# Patient Record
Sex: Female | Born: 1950 | Race: White | Hispanic: No | Marital: Married | Smoking: Never smoker
Health system: Southern US, Community
[De-identification: ages and names within clinical notes are randomized; demographics above are authoritative.]

## PROBLEM LIST (undated history)

## (undated) DIAGNOSIS — L570 Actinic keratosis: Secondary | ICD-10-CM

## (undated) DIAGNOSIS — Z972 Presence of dental prosthetic device (complete) (partial): Secondary | ICD-10-CM

## (undated) DIAGNOSIS — H269 Unspecified cataract: Secondary | ICD-10-CM

## (undated) DIAGNOSIS — I10 Essential (primary) hypertension: Secondary | ICD-10-CM

## (undated) DIAGNOSIS — E785 Hyperlipidemia, unspecified: Secondary | ICD-10-CM

## (undated) DIAGNOSIS — B261 Mumps meningitis: Secondary | ICD-10-CM

## (undated) DIAGNOSIS — T753XXA Motion sickness, initial encounter: Secondary | ICD-10-CM

## (undated) DIAGNOSIS — R7303 Prediabetes: Secondary | ICD-10-CM

## (undated) DIAGNOSIS — Z85828 Personal history of other malignant neoplasm of skin: Secondary | ICD-10-CM

## (undated) DIAGNOSIS — M1990 Unspecified osteoarthritis, unspecified site: Secondary | ICD-10-CM

## (undated) DIAGNOSIS — C801 Malignant (primary) neoplasm, unspecified: Secondary | ICD-10-CM

## (undated) HISTORY — DX: Malignant (primary) neoplasm, unspecified: C80.1

## (undated) HISTORY — PX: APPENDECTOMY: SHX54

## (undated) HISTORY — DX: Personal history of other malignant neoplasm of skin: Z85.828

## (undated) HISTORY — DX: Hyperlipidemia, unspecified: E78.5

## (undated) HISTORY — PX: ORTHOPEDIC SURGERY: SHX850

## (undated) HISTORY — DX: Actinic keratosis: L57.0

## (undated) HISTORY — PX: KNEE SURGERY: SHX244

## (undated) HISTORY — DX: Unspecified cataract: H26.9

## (undated) HISTORY — DX: Mumps meningitis: B26.1

---

## 2000-07-29 ENCOUNTER — Other Ambulatory Visit: Admission: RE | Admit: 2000-07-29 | Discharge: 2000-07-29 | Payer: Self-pay | Admitting: Internal Medicine

## 2003-01-21 ENCOUNTER — Encounter: Admission: RE | Admit: 2003-01-21 | Discharge: 2003-01-21 | Payer: Self-pay | Admitting: Internal Medicine

## 2003-01-21 ENCOUNTER — Encounter: Payer: Self-pay | Admitting: Internal Medicine

## 2003-09-06 ENCOUNTER — Other Ambulatory Visit: Admission: RE | Admit: 2003-09-06 | Discharge: 2003-09-06 | Payer: Self-pay | Admitting: Family Medicine

## 2003-10-07 ENCOUNTER — Encounter: Admission: RE | Admit: 2003-10-07 | Discharge: 2003-10-07 | Payer: Self-pay | Admitting: Family Medicine

## 2005-07-09 ENCOUNTER — Ambulatory Visit: Payer: Self-pay | Admitting: Family Medicine

## 2005-09-06 ENCOUNTER — Other Ambulatory Visit: Admission: RE | Admit: 2005-09-06 | Discharge: 2005-09-06 | Payer: Self-pay | Admitting: Family Medicine

## 2005-09-06 ENCOUNTER — Ambulatory Visit: Payer: Self-pay | Admitting: Family Medicine

## 2005-09-06 ENCOUNTER — Encounter: Admission: RE | Admit: 2005-09-06 | Discharge: 2005-09-06 | Payer: Self-pay | Admitting: Family Medicine

## 2005-10-04 ENCOUNTER — Encounter: Admission: RE | Admit: 2005-10-04 | Discharge: 2005-10-04 | Payer: Self-pay | Admitting: Family Medicine

## 2006-01-22 ENCOUNTER — Encounter: Admission: RE | Admit: 2006-01-22 | Discharge: 2006-01-22 | Payer: Self-pay | Admitting: Family Medicine

## 2006-11-24 ENCOUNTER — Ambulatory Visit: Payer: Self-pay | Admitting: Family Medicine

## 2007-12-08 ENCOUNTER — Telehealth: Payer: Self-pay | Admitting: Family Medicine

## 2011-07-15 ENCOUNTER — Emergency Department (HOSPITAL_COMMUNITY)
Admission: EM | Admit: 2011-07-15 | Discharge: 2011-07-15 | Disposition: A | Discharge status: Home / Self Care | Payer: Self-pay | Attending: Emergency Medicine | Admitting: Emergency Medicine

## 2011-07-15 DIAGNOSIS — I10 Essential (primary) hypertension: Secondary | ICD-10-CM | POA: Insufficient documentation

## 2011-07-15 DIAGNOSIS — S01501A Unspecified open wound of lip, initial encounter: Secondary | ICD-10-CM | POA: Insufficient documentation

## 2011-07-15 DIAGNOSIS — R221 Localized swelling, mass and lump, neck: Secondary | ICD-10-CM | POA: Insufficient documentation

## 2011-07-15 DIAGNOSIS — R220 Localized swelling, mass and lump, head: Secondary | ICD-10-CM | POA: Insufficient documentation

## 2011-07-15 DIAGNOSIS — Y990 Civilian activity done for income or pay: Secondary | ICD-10-CM | POA: Insufficient documentation

## 2011-07-15 DIAGNOSIS — W540XXA Bitten by dog, initial encounter: Secondary | ICD-10-CM | POA: Insufficient documentation

## 2011-07-15 DIAGNOSIS — S025XXA Fracture of tooth (traumatic), initial encounter for closed fracture: Secondary | ICD-10-CM | POA: Insufficient documentation

## 2011-10-21 ENCOUNTER — Encounter: Payer: Self-pay | Admitting: Emergency Medicine

## 2011-10-21 ENCOUNTER — Emergency Department (HOSPITAL_COMMUNITY)
Admission: EM | Admit: 2011-10-21 | Discharge: 2011-10-22 | Disposition: A | Discharge status: Home / Self Care | Payer: Self-pay | Attending: Emergency Medicine | Admitting: Emergency Medicine

## 2011-10-21 ENCOUNTER — Other Ambulatory Visit: Payer: Self-pay

## 2011-10-21 ENCOUNTER — Emergency Department (HOSPITAL_COMMUNITY): Payer: Self-pay

## 2011-10-21 DIAGNOSIS — Z9889 Other specified postprocedural states: Secondary | ICD-10-CM | POA: Insufficient documentation

## 2011-10-21 DIAGNOSIS — I10 Essential (primary) hypertension: Secondary | ICD-10-CM | POA: Insufficient documentation

## 2011-10-21 DIAGNOSIS — R000 Tachycardia, unspecified: Secondary | ICD-10-CM | POA: Insufficient documentation

## 2011-10-21 DIAGNOSIS — R002 Palpitations: Secondary | ICD-10-CM

## 2011-10-21 HISTORY — DX: Essential (primary) hypertension: I10

## 2011-10-21 NOTE — ED Notes (Signed)
PT. REPORTS PALPITATIONS " MY HEART IS POUNDING"  ONSET THIS EVENING ,  DENIES PAIN ,  SLIGHT SOB ,  NO COUGH , NO NAUSEA OR VOMITTING .

## 2011-10-22 LAB — CBC
HCT: 41 % (ref 36.0–46.0)
Hemoglobin: 13.9 g/dL (ref 12.0–15.0)
MCH: 31.7 pg (ref 26.0–34.0)
MCHC: 33.9 g/dL (ref 30.0–36.0)
MCV: 93.6 fL (ref 78.0–100.0)
Platelets: 235 10*3/uL (ref 150–400)
RBC: 4.38 MIL/uL (ref 3.87–5.11)
RDW: 13.2 % (ref 11.5–15.5)
WBC: 7.2 10*3/uL (ref 4.0–10.5)

## 2011-10-22 LAB — BASIC METABOLIC PANEL
BUN: 11 mg/dL (ref 6–23)
CO2: 31 mEq/L (ref 19–32)
Calcium: 9.6 mg/dL (ref 8.4–10.5)
Chloride: 104 mEq/L (ref 96–112)
Creatinine, Ser: 0.73 mg/dL (ref 0.50–1.10)
GFR calc Af Amer: >90 mL/min (ref >90)
GFR calc non Af Amer: >90 mL/min (ref >90)
Glucose, Bld: 135 mg/dL — ABNORMAL HIGH (ref 70–99)
Potassium: 3.4 mEq/L — ABNORMAL LOW (ref 3.5–5.1)
Sodium: 145 mEq/L (ref 135–145)

## 2011-10-22 LAB — DIFFERENTIAL
Basophils Absolute: 0 10*3/uL (ref 0.0–0.1)
Basophils Relative: 0 % (ref 0–1)
Eosinophils Absolute: 0.1 10*3/uL (ref 0.0–0.7)
Eosinophils Relative: 1 % (ref 0–5)
Lymphocytes Relative: 17 % (ref 12–46)
Lymphs Abs: 1.2 10*3/uL (ref 0.7–4.0)
Monocytes Absolute: 0.5 10*3/uL (ref 0.1–1.0)
Monocytes Relative: 7 % (ref 3–12)
Neutro Abs: 5.4 10*3/uL (ref 1.7–7.7)
Neutrophils Relative %: 75 % (ref 43–77)

## 2011-10-22 LAB — POCT I-STAT TROPONIN I: Troponin i, poc: 0 ng/mL (ref 0.00–0.08)

## 2011-10-22 MED ORDER — METOPROLOL SUCCINATE ER 25 MG PO TB24: 100.0000 mg | ORAL_TABLET | Freq: Every day | ORAL | Status: DC

## 2011-10-22 MED ORDER — TRIAMTERENE-HCTZ 37.5-25 MG PO TABS: 1.0000 | ORAL_TABLET | Freq: Every day | ORAL | Status: DC

## 2011-10-22 NOTE — ED Provider Notes (Addendum)
History     CSN: [id number] Arrival date & time: 10/21/2011 11:15 PM   First MD Initiated Contact with Patient 10/22/11 0200      Chief Complaint  Patient presents with  . Palpitations    (Consider location/radiation/quality/duration/timing/severity/associated sxs/prior treatment) Patient is a 60 y.o. female presenting with palpitations. The history is provided by the patient.  Palpitations  This is a recurrent problem. The current episode started 1 to 2 hours ago. The problem occurs constantly. The problem has been resolved. Associated with: nothing. Pertinent negatives include no diaphoresis, no fever and no chest pain. She has tried nothing for the symptoms. Risk factors include no known risk factors.    Past Medical History  Diagnosis Date  . Hypertension     Past Surgical History  Procedure Date  . Cesarean section   . Appendectomy     No family history on file.  History  Substance Use Topics  . Smoking status: Never Smoker   . Smokeless tobacco: Not on file  . Alcohol Use: No    OB History    Grav Para Term Preterm Abortions TAB SAB Ect Mult Living                  Review of Systems  Constitutional: Negative for fever and diaphoresis.  Cardiovascular: Positive for palpitations. Negative for chest pain.  All other systems reviewed and are negative.    Allergies  Morphine and related  Home Medications  No current outpatient prescriptions on file.  BP 163/88  Pulse 64  Temp(Src) 98.2 F (36.8 C) (Oral)  Resp 20  SpO2 98%  Physical Exam  Nursing note and vitals reviewed. Constitutional: She is oriented to person, place, and time. She appears well-developed and well-nourished. No distress.  HENT:  Head: Normocephalic and atraumatic.  Neck: Normal range of motion. Neck supple.  Cardiovascular: Normal rate and regular rhythm.  Exam reveals no gallop and no friction rub.   No murmur heard. Pulmonary/Chest: Effort normal and breath sounds normal.  No respiratory distress. She has no wheezes.  Abdominal: Soft. Bowel sounds are normal. She exhibits no distension. There is no tenderness.  Musculoskeletal: Normal range of motion.  Neurological: She is alert and oriented to person, place, and time.  Skin: Skin is warm and dry. She is not diaphoretic.    ED Course  Procedures (including critical care time)  Labs Reviewed  BASIC METABOLIC PANEL - Abnormal; Notable for the following:    Potassium 3.4 (*)    Glucose, Bld 135 (*)    All other components within normal limits  CBC  DIFFERENTIAL  POCT I-STAT TROPONIN I  I-STAT TROPONIN I   Dg Chest 2 View  10/22/2011  *RADIOLOGY REPORT*  Clinical Data: Palpitations and tachycardia.  CHEST - 2 VIEW  Comparison: None.  Findings: The heart size and mediastinal contours are normal. The lungs are clear. There is no pleural effusion or pneumothorax. No acute osseous findings are identified.  Small calcification adjacent to the proximal right humerus may reflect hydroxyapatite deposition.  IMPRESSION: No acute cardiopulmonary process.  Possible right shoulder calcific tendonitis or bursitis.  Original Report Authenticated By: Gerrianne Scale, M.D.     No diagnosis found.   Date: 10/22/2011  Rate: 67  Rhythm: normal sinus rhythm  QRS Axis: normal  Intervals: normal  ST/T Wave abnormalities: normal  Conduction Disutrbances:none  Narrative Interpretation:   Old EKG Reviewed: none available    MDM  Patient's symptoms resolved prior to arrival.  Her ekg and lab work looks fine.  She had no pain and I will discharge her with the recommendations that she see her pcp to discuss event monitoring if symptoms recur.          Geoffery Lyons, MD 10/22/11 1191  Geoffery Lyons, MD 01/30/12 1501

## 2012-07-05 ENCOUNTER — Emergency Department (HOSPITAL_COMMUNITY): Payer: Self-pay

## 2012-07-05 ENCOUNTER — Encounter (HOSPITAL_COMMUNITY): Payer: Self-pay | Admitting: Emergency Medicine

## 2012-07-05 ENCOUNTER — Emergency Department (HOSPITAL_COMMUNITY)
Admission: EM | Admit: 2012-07-05 | Discharge: 2012-07-05 | Disposition: A | Discharge status: Home / Self Care | Payer: Self-pay | Attending: Emergency Medicine | Admitting: Emergency Medicine

## 2012-07-05 DIAGNOSIS — K8050 Calculus of bile duct without cholangitis or cholecystitis without obstruction: Secondary | ICD-10-CM

## 2012-07-05 DIAGNOSIS — I10 Essential (primary) hypertension: Secondary | ICD-10-CM | POA: Insufficient documentation

## 2012-07-05 DIAGNOSIS — Z9089 Acquired absence of other organs: Secondary | ICD-10-CM | POA: Insufficient documentation

## 2012-07-05 DIAGNOSIS — K8020 Calculus of gallbladder without cholecystitis without obstruction: Secondary | ICD-10-CM | POA: Insufficient documentation

## 2012-07-05 DIAGNOSIS — Z7982 Long term (current) use of aspirin: Secondary | ICD-10-CM | POA: Insufficient documentation

## 2012-07-05 LAB — COMPREHENSIVE METABOLIC PANEL
ALT: 16 U/L (ref 0–35)
AST: 23 U/L (ref 0–37)
Albumin: 3.9 g/dL (ref 3.5–5.2)
Alkaline Phosphatase: 69 U/L (ref 39–117)
BUN: 23 mg/dL (ref 6–23)
CO2: 32 mEq/L (ref 19–32)
Calcium: 10.2 mg/dL (ref 8.4–10.5)
Chloride: 99 mEq/L (ref 96–112)
Creatinine, Ser: 1.07 mg/dL (ref 0.50–1.10)
GFR calc Af Amer: 64 mL/min — ABNORMAL LOW (ref >90)
GFR calc non Af Amer: 55 mL/min — ABNORMAL LOW (ref >90)
Glucose, Bld: 168 mg/dL — ABNORMAL HIGH (ref 70–99)
Potassium: 3 mEq/L — ABNORMAL LOW (ref 3.5–5.1)
Sodium: 141 mEq/L (ref 135–145)
Total Bilirubin: 0.3 mg/dL (ref 0.3–1.2)
Total Protein: 7.9 g/dL (ref 6.0–8.3)

## 2012-07-05 LAB — CBC WITH DIFFERENTIAL/PLATELET
Basophils Absolute: 0 10*3/uL (ref 0.0–0.1)
Basophils Relative: 0 % (ref 0–1)
Eosinophils Absolute: 0.1 10*3/uL (ref 0.0–0.7)
Eosinophils Relative: 1 % (ref 0–5)
HCT: 39.4 % (ref 36.0–46.0)
Hemoglobin: 13.1 g/dL (ref 12.0–15.0)
Lymphocytes Relative: 11 % — ABNORMAL LOW (ref 12–46)
Lymphs Abs: 1.1 10*3/uL (ref 0.7–4.0)
MCH: 31 pg (ref 26.0–34.0)
MCHC: 33.2 g/dL (ref 30.0–36.0)
MCV: 93.4 fL (ref 78.0–100.0)
Monocytes Absolute: 0.3 10*3/uL (ref 0.1–1.0)
Monocytes Relative: 3 % (ref 3–12)
Neutro Abs: 8.7 10*3/uL — ABNORMAL HIGH (ref 1.7–7.7)
Neutrophils Relative %: 85 % — ABNORMAL HIGH (ref 43–77)
Platelets: 220 10*3/uL (ref 150–400)
RBC: 4.22 MIL/uL (ref 3.87–5.11)
RDW: 13.6 % (ref 11.5–15.5)
WBC: 10.2 10*3/uL (ref 4.0–10.5)

## 2012-07-05 MED ORDER — PANTOPRAZOLE SODIUM 40 MG IV SOLR
40.0000 mg | Freq: Once | INTRAVENOUS | Status: AC
  Administered 2012-07-05: 40 mg via INTRAVENOUS
  Filled 2012-07-05: qty 40

## 2012-07-05 MED ORDER — SODIUM CHLORIDE 0.9 % IV SOLN
Freq: Once | INTRAVENOUS | Status: AC
  Administered 2012-07-05: 1000 mL via INTRAVENOUS

## 2012-07-05 MED ORDER — ONDANSETRON HCL 4 MG/2ML IJ SOLN
4.0000 mg | Freq: Once | INTRAMUSCULAR | Status: AC
  Administered 2012-07-05: 4 mg via INTRAVENOUS
  Filled 2012-07-05: qty 2

## 2012-07-05 MED ORDER — HYDROCODONE-ACETAMINOPHEN 5-325 MG PO TABS
1.0000 | ORAL_TABLET | Freq: Once | ORAL | Status: AC
  Administered 2012-07-05: 1 via ORAL
  Filled 2012-07-05: qty 1

## 2012-07-05 MED ORDER — FENTANYL CITRATE 0.05 MG/ML IJ SOLN
50.0000 ug | Freq: Once | INTRAMUSCULAR | Status: AC
  Administered 2012-07-05: 50 ug via INTRAVENOUS
  Filled 2012-07-05: qty 2

## 2012-07-05 MED ORDER — HYDROCODONE-ACETAMINOPHEN 5-500 MG PO TABS
1.0000 | ORAL_TABLET | Freq: Four times a day (QID) | ORAL | Status: AC | PRN
Start: 2012-07-05 — End: 2012-07-15

## 2012-07-05 MED ORDER — PROMETHAZINE HCL 25 MG PO TABS: 12.5000 mg | ORAL_TABLET | Freq: Four times a day (QID) | ORAL | Status: DC | PRN

## 2012-07-05 NOTE — ED Provider Notes (Signed)
History     CSN: [id number]  Arrival date & time 07/05/12  0138   First MD Initiated Contact with Patient 07/05/12 0154      Chief Complaint  Patient presents with  . Chest Pain    (Consider location/radiation/quality/duration/timing/severity/associated sxs/prior treatment) HPI Comments: Patient states she was awakened from sleep with epigastric pain, radiating to right upper, quadrant.  She's tried taking Pepcid times.  Abdomen, the relief.  She, states she's had this discomfort before.  Her last as long or radiated to her back, behind her right shoulder blade.  Mid back area.   She states she ate a salad grilled chicken for dinner  Patient is a 61 y.o. female presenting with chest pain. The history is provided by the patient.  Chest Pain The chest pain began 1 - 2 hours ago. Chest pain occurs constantly. The chest pain is unchanged. At its most intense, the pain is at 7/10. The pain is currently at 7/10. The severity of the pain is moderate. The quality of the pain is described as pressure-like. The pain radiates to the mid back. Primary symptoms include nausea. Pertinent negatives for primary symptoms include no fever, no fatigue, no shortness of breath and no vomiting. She tried nothing for the symptoms.     Past Medical History  Diagnosis Date  . Hypertension     Past Surgical History  Procedure Date  . Cesarean section   . Appendectomy     History reviewed. No pertinent family history.  History  Substance Use Topics  . Smoking status: Never Smoker   . Smokeless tobacco: Not on file  . Alcohol Use: No    OB History    Grav Para Term Preterm Abortions TAB SAB Ect Mult Living                  Review of Systems  Constitutional: Negative for fever and fatigue.  Respiratory: Negative for shortness of breath.   Cardiovascular: Positive for chest pain.  Gastrointestinal: Positive for nausea. Negative for vomiting.    Allergies  Morphine and related  Home  Medications   Current Outpatient Rx  Name Route Sig Dispense Refill  . ASPIRIN 325 MG PO TABS Oral Take 325 mg by mouth daily.      Marland Kitchen METOPROLOL SUCCINATE ER 100 MG PO TB24 Oral Take 100 mg by mouth daily.      . TRIAMTERENE-HCTZ 37.5-25 MG PO TABS Oral Take 1 tablet by mouth daily.      Marland Kitchen HYDROCODONE-ACETAMINOPHEN 5-500 MG PO TABS Oral Take 1-2 tablets by mouth every 6 (six) hours as needed for pain. 15 tablet 0  . PROMETHAZINE HCL 25 MG PO TABS Oral Take 0.5 tablets (12.5 mg total) by mouth every 6 (six) hours as needed for nausea. 30 tablet 0    BP 125/60  Pulse 57  Resp 18  SpO2 96%  Physical Exam  Constitutional: She appears well-developed and well-nourished.  HENT:  Head: Normocephalic.  Eyes: Pupils are equal, round, and reactive to light.  Neck: Normal range of motion.  Cardiovascular: Bradycardia present.        Patient beta blocker, which is the most likely explanation for her bradycardia  Pulmonary/Chest: Effort normal. She has no wheezes. She exhibits no tenderness.  Abdominal: Soft. Bowel sounds are normal. There is no hepatosplenomegaly. There is tenderness in the right upper quadrant and epigastric area.  Musculoskeletal: Normal range of motion.  Neurological: She is alert.  Skin: Skin is warm.  ED Course  Procedures (including critical care time)  Labs Reviewed  CBC WITH DIFFERENTIAL - Abnormal; Notable for the following:    Neutrophils Relative 85 (*)     Neutro Abs 8.7 (*)     Lymphocytes Relative 11 (*)     All other components within normal limits  COMPREHENSIVE METABOLIC PANEL - Abnormal; Notable for the following:    Potassium 3.0 (*)     Glucose, Bld 168 (*)     GFR calc non Af Amer 55 (*)     GFR calc Af Amer 64 (*)     All other components within normal limits   Dg Chest 2 View  07/05/2012  *RADIOLOGY REPORT*  Clinical Data: Mid sternal chest pain.  Nausea.  CHEST - 2 VIEW  Comparison: 10/21/2011  Findings: Borderline heart size with normal  pulmonary vascularity. Shallow inspiration.  No focal airspace consolidation or edema in the lungs.  No blunting of costophrenic angles.  No pneumothorax. Mediastinal contours appear intact.  Degenerative changes in the thoracic spine.  No significant change since previous study.  IMPRESSION: Borderline heart size.  No evidence of active pulmonary disease.  Original Report Authenticated By: Marlon Pel, M.D.   US Abdomen Complete  07/05/2012  *RADIOLOGY REPORT*  Clinical Data:  Epigastric pain radiating to the right upper quadrant.  COMPLETE ABDOMINAL ULTRASOUND  Comparison:  None.  Findings:  Gallbladder:  Multiple gallstones.  No gallbladder wall thickening or edema.  Murphy's sign is negative.  Common bile duct:  Normal caliber with measured diameter of 5.4 mm.  Liver:  Diffusely increased hepatic parenchymal echotexture suggesting fatty infiltration.  IVC:  Appears normal.  Pancreas:  No focal abnormality seen.  Spleen:  Spleen length measures 7.3 cm.  Normal parenchymal echotexture.  Right Kidney:  Right kidney measures 10.9 cm length.  No hydronephrosis.  Left Kidney:  Left kidney measures 10.3 cm length.  No hydronephrosis.  Abdominal aorta:  No aneurysm identified.  IMPRESSION: Cholelithiasis.  Increased hepatic echotexture suggesting fatty infiltration.  Original Report Authenticated By: Marlon Pel, M.D.     1. Biliary colic     ED ECG REPORT   Date: 07/05/2012  EKG Time: 5:39 AM  Rate: 53  Rhythm: sinus bradycardia,  unchanged from previous tracings  Axis: normal  Intervals:none  ST&T Change: questionable septal infarct age indertermind age  unchanged for previous of 12/12  Narrative Interpretation: abnoramal            MDM  I discussed at length.  Results ultrasound, as well as lab work, and EKG patient understand signs and symptoms to return to the emergency department for further treatment versus elective surgery or therapy through [facility] [place] [facility] or at  [facility]         Arman Filter, NP 07/05/12 [phone]

## 2012-07-05 NOTE — ED Notes (Signed)
Oxygen 2L applied due to sats dropping when she falls asleep

## 2012-07-05 NOTE — ED Notes (Addendum)
Patient complaining of mid-sternal chest pain that started three hours ago; reports taking Pepcid, Tums, and Advil; has provided little to no relief.  Pain radiates to back.  Denies shortness of breath, but reports nausea.

## 2012-07-05 NOTE — ED Notes (Signed)
Patient states she is having mid sternal chest pain that goes "plum through to my back".  States has had this before but would take Pepcid and it would go away but not this time.

## 2012-07-05 NOTE — ED Provider Notes (Signed)
Medical screening examination/treatment/procedure(s) were performed by non-physician practitioner and as supervising physician I was immediately available for consultation/collaboration.  Georges Victorio M Dason Mosley, MD 07/05/12 0629 

## 2012-09-19 ENCOUNTER — Encounter (HOSPITAL_COMMUNITY): Payer: Self-pay | Admitting: *Deleted

## 2012-09-19 ENCOUNTER — Inpatient Hospital Stay (HOSPITAL_COMMUNITY)
Admission: EM | Admit: 2012-09-19 | Discharge: 2012-09-23 | DRG: 572 | Disposition: A | Discharge status: Home Health | Payer: No Typology Code available for payment source | Attending: Internal Medicine | Admitting: Internal Medicine

## 2012-09-19 DIAGNOSIS — Z885 Allergy status to narcotic agent status: Secondary | ICD-10-CM

## 2012-09-19 DIAGNOSIS — L0390 Cellulitis, unspecified: Secondary | ICD-10-CM

## 2012-09-19 DIAGNOSIS — I10 Essential (primary) hypertension: Secondary | ICD-10-CM | POA: Diagnosis present

## 2012-09-19 DIAGNOSIS — L03119 Cellulitis of unspecified part of limb: Principal | ICD-10-CM | POA: Diagnosis present

## 2012-09-19 DIAGNOSIS — IMO0002 Reserved for concepts with insufficient information to code with codable children: Secondary | ICD-10-CM

## 2012-09-19 DIAGNOSIS — E876 Hypokalemia: Secondary | ICD-10-CM | POA: Diagnosis present

## 2012-09-19 DIAGNOSIS — L089 Local infection of the skin and subcutaneous tissue, unspecified: Secondary | ICD-10-CM

## 2012-09-19 DIAGNOSIS — T8130XA Disruption of wound, unspecified, initial encounter: Secondary | ICD-10-CM

## 2012-09-19 DIAGNOSIS — Y929 Unspecified place or not applicable: Secondary | ICD-10-CM

## 2012-09-19 DIAGNOSIS — L02419 Cutaneous abscess of limb, unspecified: Principal | ICD-10-CM | POA: Diagnosis present

## 2012-09-19 LAB — CBC WITH DIFFERENTIAL/PLATELET
Basophils Absolute: 0 10*3/uL (ref 0.0–0.1)
Eosinophils Absolute: 0.1 10*3/uL (ref 0.0–0.7)
Eosinophils Relative: 3 % (ref 0–5)
Lymphocytes Relative: 33 % (ref 12–46)
MCH: 30.1 pg (ref 26.0–34.0)
MCV: 93.1 fL (ref 78.0–100.0)
Platelets: 227 10*3/uL (ref 150–400)
RDW: 13.9 % (ref 11.5–15.5)
WBC: 4.5 10*3/uL (ref 4.0–10.5)

## 2012-09-19 LAB — COMPREHENSIVE METABOLIC PANEL
ALT: 268 U/L — ABNORMAL HIGH (ref 0–35)
AST: 227 U/L — ABNORMAL HIGH (ref 0–37)
Calcium: 9.4 mg/dL (ref 8.4–10.5)
Sodium: 141 mEq/L (ref 135–145)
Total Protein: 8 g/dL (ref 6.0–8.3)

## 2012-09-19 MED ORDER — SODIUM CHLORIDE 0.9 % IV SOLN
INTRAVENOUS | Status: AC
  Administered 2012-09-19: via INTRAVENOUS

## 2012-09-19 MED ORDER — PIPERACILLIN-TAZOBACTAM 3.375 G IVPB
3.3750 g | Freq: Once | INTRAVENOUS | Status: AC
  Administered 2012-09-19: 3.375 g via INTRAVENOUS
  Filled 2012-09-19 (×2): qty 50

## 2012-09-19 MED ORDER — VANCOMYCIN HCL IN DEXTROSE 1-5 GM/200ML-% IV SOLN
1000.0000 mg | Freq: Once | INTRAVENOUS | Status: AC
  Administered 2012-09-19: 1000 mg via INTRAVENOUS
  Filled 2012-09-19: qty 200

## 2012-09-19 MED ORDER — ONDANSETRON HCL 4 MG/2ML IJ SOLN: 4.0000 mg | Freq: Three times a day (TID) | INTRAMUSCULAR | Status: AC | PRN
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
## 2012-09-19 NOTE — ED Notes (Signed)
Left leg wound is minimally painful, has no redness or streaking and has minimal drainage, no bleeding noted
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
## 2012-09-20 ENCOUNTER — Observation Stay (HOSPITAL_COMMUNITY): Payer: No Typology Code available for payment source

## 2012-09-20 ENCOUNTER — Encounter (HOSPITAL_COMMUNITY): Payer: Self-pay | Admitting: *Deleted

## 2012-09-20 DIAGNOSIS — L0390 Cellulitis, unspecified: Secondary | ICD-10-CM

## 2012-09-20 DIAGNOSIS — T8131XA Disruption of external operation (surgical) wound, not elsewhere classified, initial encounter: Secondary | ICD-10-CM

## 2012-09-20 DIAGNOSIS — I10 Essential (primary) hypertension: Secondary | ICD-10-CM | POA: Diagnosis present

## 2012-09-20 LAB — COMPREHENSIVE METABOLIC PANEL
AST: 342 U/L — ABNORMAL HIGH (ref 0–37)
CO2: 30 mEq/L (ref 19–32)
Calcium: 9.2 mg/dL (ref 8.4–10.5)
Chloride: 105 mEq/L (ref 96–112)
Creatinine, Ser: 0.75 mg/dL (ref 0.50–1.10)
GFR calc Af Amer: >90 mL/min (ref >90)
GFR calc non Af Amer: 90 mL/min — ABNORMAL LOW (ref >90)
Glucose, Bld: 89 mg/dL (ref 70–99)
Total Bilirubin: 0.3 mg/dL (ref 0.3–1.2)

## 2012-09-20 LAB — APTT: aPTT: 31 seconds (ref 24–37)

## 2012-09-20 LAB — PROTIME-INR: INR: 1.07 (ref 0.00–1.49)

## 2012-09-20 LAB — CBC
HCT: 37.6 % (ref 36.0–46.0)
Hemoglobin: 12.1 g/dL (ref 12.0–15.0)
MCH: 29.9 pg (ref 26.0–34.0)
MCV: 92.8 fL (ref 78.0–100.0)
RBC: 4.05 MIL/uL (ref 3.87–5.11)
WBC: 4.4 10*3/uL (ref 4.0–10.5)

## 2012-09-20 LAB — SURGICAL PCR SCREEN: MRSA, PCR: NEGATIVE

## 2012-09-20 MED ORDER — PIPERACILLIN-TAZOBACTAM 3.375 G IVPB: 3.3750 g | Freq: Four times a day (QID) | INTRAVENOUS | Status: DC

## 2012-09-20 MED ORDER — HEPARIN SODIUM (PORCINE) 5000 UNIT/ML IJ SOLN
5000.0000 [IU] | Freq: Three times a day (TID) | INTRAMUSCULAR | Status: DC
  Administered 2012-09-20 (×2): 5000 [IU] via SUBCUTANEOUS
  Filled 2012-09-20 (×4): qty 1

## 2012-09-20 MED ORDER — SODIUM CHLORIDE 0.9 % IJ SOLN: 3.0000 mL | INTRAMUSCULAR | Status: DC | PRN

## 2012-09-20 MED ORDER — VANCOMYCIN HCL 1000 MG IV SOLR
750.0000 mg | Freq: Two times a day (BID) | INTRAVENOUS | Status: DC
  Administered 2012-09-20 – 2012-09-23 (×7): 750 mg via INTRAVENOUS
  Filled 2012-09-20 (×8): qty 750

## 2012-09-20 MED ORDER — HEPARIN SODIUM (PORCINE) 5000 UNIT/ML IJ SOLN
5000.0000 [IU] | Freq: Three times a day (TID) | INTRAMUSCULAR | Status: AC
  Administered 2012-09-20: 5000 [IU] via SUBCUTANEOUS
  Filled 2012-09-20: qty 1

## 2012-09-20 MED ORDER — SODIUM CHLORIDE 0.9 % IJ SOLN
3.0000 mL | Freq: Two times a day (BID) | INTRAMUSCULAR | Status: DC
  Administered 2012-09-20: 3 mL via INTRAVENOUS

## 2012-09-20 MED ORDER — TRIAMTERENE-HCTZ 37.5-25 MG PO TABS
1.0000 | ORAL_TABLET | Freq: Every day | ORAL | Status: DC
  Administered 2012-09-20 – 2012-09-22 (×3): 1 via ORAL
  Filled 2012-09-20 (×4): qty 1

## 2012-09-20 MED ORDER — SODIUM CHLORIDE 0.9 % IV SOLN: 250.0000 mL | INTRAVENOUS | Status: DC | PRN

## 2012-09-20 MED ORDER — HYDROCODONE-ACETAMINOPHEN 5-325 MG PO TABS
1.0000 | ORAL_TABLET | Freq: Four times a day (QID) | ORAL | Status: DC | PRN
  Administered 2012-09-20 – 2012-09-22 (×4): 1 via ORAL
  Filled 2012-09-20 (×3): qty 1

## 2012-09-20 MED ORDER — INFLUENZA VIRUS VACC SPLIT PF IM SUSP
0.5000 mL | INTRAMUSCULAR | Status: AC
  Administered 2012-09-20: 0.5 mL via INTRAMUSCULAR
  Filled 2012-09-20: qty 0.5

## 2012-09-20 MED ORDER — ATENOLOL 50 MG PO TABS
50.0000 mg | ORAL_TABLET | Freq: Every day | ORAL | Status: DC
  Administered 2012-09-20 – 2012-09-23 (×3): 50 mg via ORAL
  Filled 2012-09-20 (×4): qty 1

## 2012-09-20 MED ORDER — HEPARIN SODIUM (PORCINE) 5000 UNIT/ML IJ SOLN
5000.0000 [IU] | Freq: Three times a day (TID) | INTRAMUSCULAR | Status: DC
  Administered 2012-09-21 – 2012-09-23 (×6): 5000 [IU] via SUBCUTANEOUS
  Filled 2012-09-20 (×8): qty 1

## 2012-09-20 MED ORDER — PIPERACILLIN-TAZOBACTAM 3.375 G IVPB
3.3750 g | Freq: Three times a day (TID) | INTRAVENOUS | Status: DC
  Administered 2012-09-20 – 2012-09-23 (×10): 3.375 g via INTRAVENOUS
  Filled 2012-09-20 (×13): qty 50
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
## 2012-09-20 NOTE — Evaluation (Signed)
Physical Therapy Evaluation Patient Details Name: Jennifer [name] MRN: [id number] DOB: 25-Apr-1951 Today's Date: 09/20/2012 Time: 4098-1191 PT Time Calculation (min): 23 min  PT Assessment / Plan / Recommendation Clinical Impression  Pt admitted for I&D on L thigh(planned for tomorrow). Pt still on camboot from surgery last month. Pt is still ambulating with RW although no cues needed for safety. No further PT needs, will not follow.     PT Assessment  Patent does not need any further PT services    Follow Up Recommendations  No PT follow up    Does the patient have the potential to tolerate intense rehabilitation      Barriers to Discharge        Equipment Recommendations  None recommended by PT    Recommendations for Other Services     Frequency      Precautions / Restrictions Precautions Precautions: None Restrictions Weight Bearing Restrictions: Yes RLE Weight Bearing: Weight bearing as tolerated Other Position/Activity Restrictions: cam boot on LLE   Pertinent Vitals/Pain No pain      Mobility  Bed Mobility Bed Mobility: Supine to Sit;Sitting - Scoot to Edge of Bed Supine to Sit: 7: Independent Sitting - Scoot to Edge of Bed: 7: Independent Transfers Transfers: Stand to Sit;Sit to Stand Sit to Stand: 6: Modified independent (Device/Increase time) Stand to Sit: 6: Modified independent (Device/Increase time) Ambulation/Gait Ambulation/Gait Assistance: 6: Modified independent (Device/Increase time) Ambulation Distance (Feet): 200 Feet Assistive device: Rolling walker Ambulation/Gait Assistance Details: Pt still with cam boot on LLE secondary to surgery. No cues for safety or technique. Gait Pattern: Step-to pattern;Decreased hip/knee flexion - left Gait velocity: slow gait speed Stairs: No      Visit Information  Last PT Received On: 09/20/12 Assistance Needed: +1    Subjective Data  Patient Stated Goal: to be able to walk without pain   Prior  Functioning  Home Living Lives With: Other (Comment) (boyfriend) Available Help at Discharge: Friend(s);Available 24 hours/day Type of Home: Mobile home Home Access: Ramped entrance Home Layout: One level Bathroom Shower/Tub: Tub/shower unit;Curtain Firefighter: Standard Bathroom Accessibility: Yes How Accessible: Accessible via walker Home Adaptive Equipment: Walker - rolling;Wheelchair - Architectural technologist with back Prior Function Level of Independence: Independent with assistive device(s) Able to Take Stairs?: No Driving: Yes 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; Has been using RW and WC  Communication Communication: No difficulties Dominant Hand: Right    Cognition  Overall Cognitive Status: Appears within functional limits for tasks assessed/performed Arousal/Alertness: Awake/alert Orientation Level: Appears intact for tasks assessed Behavior During Session: [facility] for tasks performed    Extremity/Trunk Assessment Right Lower Extremity Assessment RLE ROM/Strength/Tone: Within functional levels Left Lower Extremity Assessment LLE ROM/Strength/Tone: Deficits LLE ROM/Strength/Tone Deficits: Unable to bend knee secondary to incision   Balance    End of Session PT - End of Session Equipment Utilized During Treatment: Gait belt;Other (comment) (cam boot) Activity Tolerance: Patient tolerated treatment well Patient left: in chair;with call bell/phone within reach Nurse Communication: Mobility status  GP Functional Assessment Tool Used: clinical judgement Functional Limitation: Mobility: Walking and moving around Mobility: Walking and Moving Around Current Status (Y7829): 0 percent impaired, limited or restricted Mobility: Walking and Moving Around Goal Status (F6213): 0 percent impaired, limited or restricted Mobility: Walking and Moving Around Discharge Status [phone]): 0 percent impaired, limited or restricted   Milana Kidney 09/20/2012, 5:08  PM  09/20/2012 Milana Kidney DPT PAGER: [phone] OFFICE: [phone]

## 2012-09-20 NOTE — ED Notes (Signed)
Pt taken to xray 

## 2012-09-20 NOTE — Progress Notes (Signed)
Patient seen and admitted earlier this AM by my associate.  Please refer to HPI for further details.  Orthopaedic surgeon consulted and recommendations reviewed.  Will hold anticoagulation medication 12 hours prior to procedure unless otherwise specified by surgeon.  Alyn Riedinger, [employer]
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
## 2012-09-20 NOTE — Progress Notes (Signed)
ANTIBIOTIC CONSULT NOTE - INITIAL  Pharmacy Consult for Vancomycin Indication: L thigh wound  Allergies  Allergen Reactions  . Morphine And Related Other (See Comments)    Unknown from post-surgery.    Patient Measurements: Weight: 165 lb 1.6 oz (74.889 kg)  Vital Signs: Temp: 98.5 F (36.9 C) (11/03 0249) Temp src: Axillary (11/03 0249) BP: 151/50 mmHg (11/03 0249) Pulse Rate: 61  (11/03 0249) Intake/Output from previous day:   Intake/Output from this shift:    Labs:  [facility] 09/19/12 2137  WBC 4.5  HGB 13.0  PLT 227  LABCREA --  CREATININE 0.77   CrCl is unknown because there is no height on file for the current visit. No results found for this basename: VANCOTROUGH:2,VANCOPEAK:2,VANCORANDOM:2,GENTTROUGH:2,GENTPEAK:2,GENTRANDOM:2,TOBRATROUGH:2,TOBRAPEAK:2,TOBRARND:2,AMIKACINPEAK:2,AMIKACINTROU:2,AMIKACIN:2, in the last 72 hours   Microbiology: No results found for this or any previous visit (from the past 720 hour(s)).  Medical History: Past Medical History  Diagnosis Date  . Hypertension     Medications:  Prescriptions prior to admission  Medication Sig Dispense Refill  . atenolol (TENORMIN) 50 MG tablet Take 50 mg by mouth daily.      Marland Kitchen HYDROcodone-acetaminophen (NORCO/VICODIN) 5-325 MG per tablet Take 1 tablet by mouth every 6 (six) hours as needed. For pain      . triamterene-hydrochlorothiazide (MAXZIDE-25) 37.5-25 MG per tablet Take 1 tablet by mouth daily.         Assessment: 61 y.o. female presents with infected L thigh hematoma with purulent drainage. May need surgical debridement. To begin vancomycin. Cultures pending. Pt received 1gm IV Vanc in ED at 2351.  Goal of Therapy:  Vancomycin trough level 10-15 mcg/ml  Plan:  1. Vancomycin 750mg  IV q12h. 2. F/u microbiological data, renal function, pt's clinical condition  Christoper Fabian, PharmD, BCPS Clinical pharmacist, pager [phone] 09/20/2012,3:17 AM

## 2012-09-21 ENCOUNTER — Encounter (HOSPITAL_COMMUNITY): Payer: Self-pay | Admitting: *Deleted

## 2012-09-21 ENCOUNTER — Inpatient Hospital Stay (HOSPITAL_COMMUNITY): Payer: No Typology Code available for payment source | Admitting: *Deleted

## 2012-09-21 ENCOUNTER — Encounter (HOSPITAL_COMMUNITY)
Admission: EM | Disposition: A | Discharge status: Home Health | Payer: Self-pay | Source: Home / Self Care | Attending: Family Medicine

## 2012-09-21 DIAGNOSIS — I10 Essential (primary) hypertension: Secondary | ICD-10-CM

## 2012-09-21 HISTORY — PX: I & D EXTREMITY: SHX5045

## 2012-09-21 SURGERY — IRRIGATION AND DEBRIDEMENT EXTREMITY
Anesthesia: General | Site: Thigh | Laterality: Left | Wound class: Dirty or Infected

## 2012-09-21 MED ORDER — PROPOFOL 10 MG/ML IV BOLUS
INTRAVENOUS | Status: DC | PRN
  Administered 2012-09-21: 200 mg via INTRAVENOUS

## 2012-09-21 MED ORDER — ONDANSETRON HCL 4 MG/2ML IJ SOLN: 4.0000 mg | Freq: Four times a day (QID) | INTRAMUSCULAR | Status: DC | PRN

## 2012-09-21 MED ORDER — LACTATED RINGERS IV SOLN
INTRAVENOUS | Status: DC | PRN
  Administered 2012-09-21: 16:00:00 via INTRAVENOUS

## 2012-09-21 MED ORDER — GLYCOPYRROLATE 0.2 MG/ML IJ SOLN
INTRAMUSCULAR | Status: DC | PRN
  Administered 2012-09-21: 0.4 mg via INTRAVENOUS

## 2012-09-21 MED ORDER — OXYCODONE HCL 5 MG PO TABS
5.0000 mg | ORAL_TABLET | Freq: Once | ORAL | Status: AC | PRN
  Administered 2012-09-21: 5 mg via ORAL
  Filled 2012-09-21: qty 1

## 2012-09-21 MED ORDER — HYDROMORPHONE HCL PF 1 MG/ML IJ SOLN
0.2500 mg | INTRAMUSCULAR | Status: DC | PRN
  Administered 2012-09-21: 0.5 mg via INTRAVENOUS
  Filled 2012-09-21: qty 1

## 2012-09-21 MED ORDER — SODIUM CHLORIDE 0.9 % IR SOLN
Status: DC | PRN
  Administered 2012-09-21: 4000 mL

## 2012-09-21 MED ORDER — FENTANYL CITRATE 0.05 MG/ML IJ SOLN
INTRAMUSCULAR | Status: DC | PRN
  Administered 2012-09-21: 50 ug via INTRAVENOUS
  Administered 2012-09-21: 100 ug via INTRAVENOUS
  Administered 2012-09-21: 50 ug via INTRAVENOUS

## 2012-09-21 MED ORDER — OXYCODONE HCL 5 MG/5ML PO SOLN: 5.0000 mg | Freq: Once | ORAL | Status: AC | PRN

## 2012-09-21 MED ORDER — LACTATED RINGERS IV SOLN
INTRAVENOUS | Status: DC
  Administered 2012-09-21 – 2012-09-23 (×2): via INTRAVENOUS

## 2012-09-21 MED ORDER — LIDOCAINE HCL (CARDIAC) 20 MG/ML IV SOLN
INTRAVENOUS | Status: DC | PRN
  Administered 2012-09-21: 80 mg via INTRAVENOUS

## 2012-09-21 MED ORDER — HYDROCODONE-ACETAMINOPHEN 5-325 MG PO TABS
ORAL_TABLET | ORAL | Status: AC
  Administered 2012-09-21: 1 via ORAL
  Filled 2012-09-21: qty 1

## 2012-09-21 MED ORDER — LIDOCAINE HCL 4 % MT SOLN
OROMUCOSAL | Status: DC | PRN
  Administered 2012-09-21: 4 mL via TOPICAL

## 2012-09-21 SURGICAL SUPPLY — 44 items
BLADE SURG 10 STRL SS (BLADE) ×2 IMPLANT
BNDG COHESIVE 4X5 TAN STRL (GAUZE/BANDAGES/DRESSINGS) ×2 IMPLANT
BNDG COHESIVE 6X5 TAN STRL LF (GAUZE/BANDAGES/DRESSINGS) ×2 IMPLANT
BNDG GAUZE STRTCH 6 (GAUZE/BANDAGES/DRESSINGS) ×6 IMPLANT
CLOTH BEACON ORANGE TIMEOUT ST (SAFETY) ×2 IMPLANT
COTTON STERILE ROLL (GAUZE/BANDAGES/DRESSINGS) ×1 IMPLANT
COVER SURGICAL LIGHT HANDLE (MISCELLANEOUS) ×2 IMPLANT
CUFF TOURNIQUET SINGLE 18IN (TOURNIQUET CUFF) ×1 IMPLANT
CUFF TOURNIQUET SINGLE 24IN (TOURNIQUET CUFF) IMPLANT
CUFF TOURNIQUET SINGLE 34IN LL (TOURNIQUET CUFF) IMPLANT
CUFF TOURNIQUET SINGLE 44IN (TOURNIQUET CUFF) IMPLANT
DRAPE U-SHAPE 47X51 STRL (DRAPES) ×2 IMPLANT
DRSG ADAPTIC 3X8 NADH LF (GAUZE/BANDAGES/DRESSINGS) ×1 IMPLANT
DRSG VAC ATS SM SENSATRAC (GAUZE/BANDAGES/DRESSINGS) ×1 IMPLANT
DURAPREP 26ML APPLICATOR (WOUND CARE) ×3 IMPLANT
ELECT CAUTERY BLADE 6.4 (BLADE) IMPLANT
ELECT REM PT RETURN 9FT ADLT (ELECTROSURGICAL)
ELECTRODE REM PT RTRN 9FT ADLT (ELECTROSURGICAL) IMPLANT
GLOVE BIOGEL PI IND STRL 9 (GLOVE) ×1 IMPLANT
GLOVE BIOGEL PI INDICATOR 9 (GLOVE) ×1
GLOVE SURG ORTHO 9.0 STRL STRW (GLOVE) ×2 IMPLANT
GOWN PREVENTION PLUS XLARGE (GOWN DISPOSABLE) ×2 IMPLANT
GOWN SRG XL XLNG 56XLVL 4 (GOWN DISPOSABLE) ×1 IMPLANT
GOWN STRL NON-REIN XL XLG LVL4 (GOWN DISPOSABLE) ×2
HANDPIECE INTERPULSE COAX TIP (DISPOSABLE)
KIT BASIN OR (CUSTOM PROCEDURE TRAY) ×2 IMPLANT
KIT DRAINAGE VACCUM ASSIST (KITS) ×1 IMPLANT
KIT ROOM TURNOVER OR (KITS) ×2 IMPLANT
MANIFOLD NEPTUNE II (INSTRUMENTS) ×2 IMPLANT
NS IRRIG 1000ML POUR BTL (IV SOLUTION) ×2 IMPLANT
PACK ORTHO EXTREMITY (CUSTOM PROCEDURE TRAY) ×2 IMPLANT
PAD ARMBOARD 7.5X6 YLW CONV (MISCELLANEOUS) ×4 IMPLANT
PADDING CAST COTTON 6X4 STRL (CAST SUPPLIES) ×2 IMPLANT
SET HNDPC FAN SPRY TIP SCT (DISPOSABLE) IMPLANT
SPONGE GAUZE 4X4 12PLY (GAUZE/BANDAGES/DRESSINGS) ×1 IMPLANT
SPONGE LAP 18X18 X RAY DECT (DISPOSABLE) ×2 IMPLANT
STOCKINETTE IMPERVIOUS 9X36 MD (GAUZE/BANDAGES/DRESSINGS) ×2 IMPLANT
TOWEL OR 17X24 6PK STRL BLUE (TOWEL DISPOSABLE) ×2 IMPLANT
TOWEL OR 17X26 10 PK STRL BLUE (TOWEL DISPOSABLE) ×2 IMPLANT
TUBE ANAEROBIC SPECIMEN COL (MISCELLANEOUS) IMPLANT
TUBE CONNECTING 12X1/4 (SUCTIONS) ×2 IMPLANT
UNDERPAD 30X30 INCONTINENT (UNDERPADS AND DIAPERS) ×2 IMPLANT
WATER STERILE IRR 1000ML POUR (IV SOLUTION) ×2 IMPLANT
YANKAUER SUCT BULB TIP NO VENT (SUCTIONS) ×2 IMPLANT

## 2012-09-21 NOTE — Progress Notes (Signed)
Patient ID: Jennifer [name], female   DOB: [birth year], 62 y.o.   MRN: [id number] Plan for surgical intervention for her left thigh wound today. Surgery approximately 5 PM.

## 2012-09-21 NOTE — Progress Notes (Signed)
Utilization review completed. Drayce Tawil, RN, BSN. 

## 2012-09-21 NOTE — Transfer of Care (Signed)
Immediate Anesthesia Transfer of Care Note  [name]  Procedure(s) Performed: Procedure(s) (LRB) with comments: IRRIGATION AND DEBRIDEMENT EXTREMITY (Left) - Excisional Debridement Left Thigh, apply wound vac  Patient Location: PACU  Anesthesia Type:General  Level of Consciousness: awake, alert  and oriented  Airway & Oxygen Therapy: Patient Spontanous Breathing and Patient connected to face mask oxygen  Post-op Assessment: Report given to PACU RN and Post -op Vital signs reviewed and stable  Post vital signs: Reviewed and stable  Complications: No apparent anesthesia complications

## 2012-09-21 NOTE — Addendum Note (Signed)
Addendum  created 09/21/12 1812 by Raiford Simmonds, MD   Modules edited:Orders, PRL Based Order Sets

## 2012-09-21 NOTE — Preoperative (Addendum)
Beta Blockers   Reason not to administer Beta Blockers:Not Applicable 

## 2012-09-21 NOTE — Anesthesia Preprocedure Evaluation (Addendum)
Anesthesia Evaluation  Patient identified by MRN, date of birth, ID band Patient awake    Reviewed: Allergy & Precautions, H&P , NPO status , Patient's Chart, lab work & pertinent test results  Airway Mallampati: I TM Distance: >3 FB Neck ROM: full    Dental   Pulmonary          Cardiovascular hypertension, Rhythm:regular Rate:Normal     Neuro/Psych    GI/Hepatic   Endo/Other    Renal/GU      Musculoskeletal   Abdominal   Peds  Hematology   Anesthesia Other Findings   Reproductive/Obstetrics                           Anesthesia Physical Anesthesia Plan  ASA: II  Anesthesia Plan: General   Post-op Pain Management:    Induction: Intravenous  Airway Management Planned: LMA and Oral ETT  Additional Equipment:   Intra-op Plan:   Post-operative Plan: Extubation in OR  Informed Consent: I have reviewed the patients History and Physical, chart, labs and discussed the procedure including the risks, benefits and alternatives for the proposed anesthesia with the patient or authorized representative who has indicated his/her understanding and acceptance.     Plan Discussed with: CRNA, Anesthesiologist and Surgeon  Anesthesia Plan Comments:         Anesthesia Quick Evaluation
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
## 2012-09-21 NOTE — Anesthesia Postprocedure Evaluation (Signed)
  Anesthesia Post-op Note  [name]  Procedure(s) Performed: Procedure(s) (LRB) with comments: IRRIGATION AND DEBRIDEMENT EXTREMITY (Left) - Excisional Debridement Left Thigh, apply wound vac  Patient Location: PACU  Anesthesia Type:General  Level of Consciousness: awake  Airway and Oxygen Therapy: Patient Spontanous Breathing  Post-op Pain: moderate  Post-op Assessment: Post-op Vital signs reviewed, Patient's Cardiovascular Status Stable, Respiratory Function Stable, Patent Airway, No signs of Nausea or vomiting and Pain level controlled  Post-op Vital Signs: stable  Complications: No apparent anesthesia complications
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
## 2012-09-22 ENCOUNTER — Encounter (HOSPITAL_COMMUNITY): Payer: Self-pay | Admitting: Orthopedic Surgery

## 2012-09-22 DIAGNOSIS — L089 Local infection of the skin and subcutaneous tissue, unspecified: Secondary | ICD-10-CM

## 2012-09-22 DIAGNOSIS — E876 Hypokalemia: Secondary | ICD-10-CM

## 2012-09-22 LAB — BASIC METABOLIC PANEL
BUN: 12 mg/dL (ref 6–23)
CO2: 29 mEq/L (ref 19–32)
Chloride: 94 mEq/L — ABNORMAL LOW (ref 96–112)
Creatinine, Ser: 0.87 mg/dL (ref 0.50–1.10)
GFR calc Af Amer: 82 mL/min — ABNORMAL LOW (ref >90)
Potassium: 3.2 mEq/L — ABNORMAL LOW (ref 3.5–5.1)

## 2012-09-22 MED ORDER — POTASSIUM CHLORIDE CRYS ER 20 MEQ PO TBCR
30.0000 meq | EXTENDED_RELEASE_TABLET | Freq: Once | ORAL | Status: AC
  Administered 2012-09-22: 30 meq via ORAL
  Filled 2012-09-22: qty 1

## 2012-09-22 MED ORDER — HYDROCODONE-ACETAMINOPHEN 7.5-325 MG PO TABS
1.0000 | ORAL_TABLET | Freq: Four times a day (QID) | ORAL | Status: DC | PRN
  Administered 2012-09-22: 1 via ORAL
  Filled 2012-09-22: qty 1

## 2012-09-22 MED ORDER — HYDROCODONE-ACETAMINOPHEN 10-325 MG PO TABS
1.0000 | ORAL_TABLET | ORAL | Status: DC | PRN
  Administered 2012-09-22 – 2012-09-23 (×4): 1 via ORAL
  Filled 2012-09-22 (×5): qty 1

## 2012-09-22 MED ORDER — HYDROMORPHONE HCL PF 1 MG/ML IJ SOLN
1.0000 mg | INTRAMUSCULAR | Status: DC | PRN
  Administered 2012-09-22: 1 mg via INTRAVENOUS
  Filled 2012-09-22 (×3): qty 1
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
## 2012-09-22 NOTE — Progress Notes (Signed)
PHYSICAL THERAPY SCREENING:  09/22/2012  Received repeat order for PT evaluation 09/21/12.  Pt was evaluated by PT on 09/20/12 and found to have no acute PT needs.  Acute PT will not be following this pt.  Thank you for your consideration of PT services for this patient.   Dyamond Tolosa L. Kelissa Merlin DPT [phone]

## 2012-09-22 NOTE — Progress Notes (Signed)
Patient ID: Jennifer [name], female   DOB: [birth year], 61 y.o.   MRN: [id number] Postoperative day 1 status post excision necrotic wound left thigh and application of wound VAC. Patient has a very small amount of drainage in the wound VAC this morning. Will check the wound tomorrow. Consideration for continued wound VAC versus dry dressing. Physical therapy today progressive ambulation weightbearing as tolerated on the left. Where the fracture boot on left foot for ambulation.

## 2012-09-23 ENCOUNTER — Encounter (HOSPITAL_COMMUNITY): Payer: Self-pay | Admitting: General Practice

## 2012-09-23 LAB — BASIC METABOLIC PANEL
BUN: 12 mg/dL (ref 6–23)
CO2: 29 mEq/L (ref 19–32)
Chloride: 99 mEq/L (ref 96–112)
Creatinine, Ser: 0.79 mg/dL (ref 0.50–1.10)
GFR calc Af Amer: >90 mL/min (ref >90)
Potassium: 5.2 mEq/L — ABNORMAL HIGH (ref 3.5–5.1)

## 2012-09-23 LAB — MAGNESIUM: Magnesium: 2.1 mg/dL (ref 1.5–2.5)

## 2012-09-23 LAB — POTASSIUM: Potassium: 3.3 mEq/L — ABNORMAL LOW (ref 3.5–5.1)

## 2012-09-23 LAB — GLUCOSE, CAPILLARY: Glucose-Capillary: 93 mg/dL (ref 70–99)

## 2012-09-23 MED ORDER — POTASSIUM CHLORIDE CRYS ER 20 MEQ PO TBCR
40.0000 meq | EXTENDED_RELEASE_TABLET | Freq: Once | ORAL | Status: AC
  Administered 2012-09-23: 40 meq via ORAL
  Filled 2012-09-23: qty 2

## 2012-09-23 MED ORDER — SODIUM CHLORIDE 0.9 % IV SOLN: INTRAVENOUS | Status: DC

## 2012-09-23 MED ORDER — SODIUM POLYSTYRENE SULFONATE 15 GM/60ML PO SUSP
30.0000 g | Freq: Once | ORAL | Status: AC
  Administered 2012-09-23: 30 g via ORAL
  Filled 2012-09-23 (×2): qty 120

## 2012-09-23 MED ORDER — HYDROCODONE-ACETAMINOPHEN 10-325 MG PO TABS: 1.0000 | ORAL_TABLET | ORAL | Status: DC | PRN
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
## 2012-09-23 NOTE — Progress Notes (Signed)
Pt discharged to home accompanied by family. Discharge instructions and rx given and explained. Pt stated understanding. IV removed. Pt left unit in stable condition via wheelchair.
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
## 2012-09-23 NOTE — Progress Notes (Signed)
Patient ID: Jennifer [name], female   DOB: [birth year], 61 y.o.   MRN: [id number] Wound VAC removed this morning. Wound edges well approximated no ischemic changes no cellulitis. Will discontinue the wound VAC today. Mepilex plus Ace wrap applied.  Patient is stable for discharge to home. I will followup in the office in one week. She will changed dry dressing when necessary. She does not require any further antibiotics.

## 2012-09-26 LAB — CULTURE, BLOOD (ROUTINE X 2): Culture: NO GROWTH

## 2012-11-16 ENCOUNTER — Inpatient Hospital Stay (HOSPITAL_COMMUNITY)
Admission: EM | Admit: 2012-11-16 | Discharge: 2012-11-20 | DRG: 417 | Disposition: A | Discharge status: Home / Self Care | Payer: MEDICAID | Attending: Internal Medicine | Admitting: Internal Medicine

## 2012-11-16 ENCOUNTER — Emergency Department (HOSPITAL_COMMUNITY): Payer: Self-pay

## 2012-11-16 ENCOUNTER — Encounter (HOSPITAL_COMMUNITY): Payer: Self-pay | Admitting: *Deleted

## 2012-11-16 DIAGNOSIS — K859 Acute pancreatitis without necrosis or infection, unspecified: Secondary | ICD-10-CM | POA: Diagnosis present

## 2012-11-16 DIAGNOSIS — I10 Essential (primary) hypertension: Secondary | ICD-10-CM | POA: Diagnosis present

## 2012-11-16 DIAGNOSIS — E7889 Other lipoprotein metabolism disorders: Secondary | ICD-10-CM | POA: Diagnosis present

## 2012-11-16 DIAGNOSIS — E876 Hypokalemia: Secondary | ICD-10-CM

## 2012-11-16 DIAGNOSIS — K851 Biliary acute pancreatitis without necrosis or infection: Secondary | ICD-10-CM | POA: Diagnosis present

## 2012-11-16 DIAGNOSIS — K831 Obstruction of bile duct: Secondary | ICD-10-CM

## 2012-11-16 DIAGNOSIS — Z79899 Other long term (current) drug therapy: Secondary | ICD-10-CM

## 2012-11-16 DIAGNOSIS — R7989 Other specified abnormal findings of blood chemistry: Secondary | ICD-10-CM | POA: Diagnosis present

## 2012-11-16 DIAGNOSIS — I498 Other specified cardiac arrhythmias: Secondary | ICD-10-CM | POA: Diagnosis present

## 2012-11-16 DIAGNOSIS — K8019 Calculus of gallbladder with other cholecystitis with obstruction: Principal | ICD-10-CM | POA: Diagnosis present

## 2012-11-16 LAB — URINALYSIS, ROUTINE W REFLEX MICROSCOPIC
Glucose, UA: NEGATIVE mg/dL
Hgb urine dipstick: NEGATIVE
Ketones, ur: NEGATIVE mg/dL
pH: 5.5 (ref 5.0–8.0)

## 2012-11-16 LAB — CBC WITH DIFFERENTIAL/PLATELET
Basophils Absolute: 0 10*3/uL (ref 0.0–0.1)
Eosinophils Relative: 0 % (ref 0–5)
Lymphocytes Relative: 8 % — ABNORMAL LOW (ref 12–46)
Monocytes Relative: 10 % (ref 3–12)
Neutrophils Relative %: 82 % — ABNORMAL HIGH (ref 43–77)
Platelets: ADEQUATE 10*3/uL (ref 150–400)
RBC: 4.23 MIL/uL (ref 3.87–5.11)
RDW: 14.8 % (ref 11.5–15.5)
Smear Review: ADEQUATE
WBC: 6.7 10*3/uL (ref 4.0–10.5)

## 2012-11-16 LAB — CBC
HCT: 38.7 % (ref 36.0–46.0)
MCHC: 33.1 g/dL (ref 30.0–36.0)
Platelets: 179 10*3/uL (ref 150–400)
RDW: 14.9 % (ref 11.5–15.5)

## 2012-11-16 LAB — URINE MICROSCOPIC-ADD ON

## 2012-11-16 LAB — COMPREHENSIVE METABOLIC PANEL
ALT: 472 U/L — ABNORMAL HIGH (ref 0–35)
AST: 441 U/L — ABNORMAL HIGH (ref 0–37)
Albumin: 3.5 g/dL (ref 3.5–5.2)
Alkaline Phosphatase: 380 U/L — ABNORMAL HIGH (ref 39–117)
CO2: 27 mEq/L (ref 19–32)
Chloride: 98 mEq/L (ref 96–112)
Creatinine, Ser: 0.6 mg/dL (ref 0.50–1.10)
Potassium: 3.1 mEq/L — ABNORMAL LOW (ref 3.5–5.1)
Sodium: 138 mEq/L (ref 135–145)
Total Bilirubin: 2.1 mg/dL — ABNORMAL HIGH (ref 0.3–1.2)

## 2012-11-16 LAB — MAGNESIUM: Magnesium: 2.1 mg/dL (ref 1.5–2.5)

## 2012-11-16 LAB — CREATININE, SERUM
Creatinine, Ser: 0.57 mg/dL (ref 0.50–1.10)
GFR calc Af Amer: >90 mL/min (ref >90)
GFR calc non Af Amer: >90 mL/min (ref >90)

## 2012-11-16 MED ORDER — SODIUM CHLORIDE 0.9 % IV SOLN
INTRAVENOUS | Status: DC
  Administered 2012-11-16 – 2012-11-17 (×2): via INTRAVENOUS

## 2012-11-16 MED ORDER — ACETAMINOPHEN 650 MG RE SUPP: 650.0000 mg | Freq: Four times a day (QID) | RECTAL | Status: DC | PRN

## 2012-11-16 MED ORDER — ENOXAPARIN SODIUM 40 MG/0.4ML ~~LOC~~ SOLN
40.0000 mg | SUBCUTANEOUS | Status: DC
  Administered 2012-11-16 – 2012-11-17 (×2): 40 mg via SUBCUTANEOUS
  Filled 2012-11-16 (×5): qty 0.4

## 2012-11-16 MED ORDER — POTASSIUM CHLORIDE 10 MEQ/100ML IV SOLN
10.0000 meq | INTRAVENOUS | Status: AC
  Administered 2012-11-16 (×4): 10 meq via INTRAVENOUS
  Filled 2012-11-16 (×2): qty 100

## 2012-11-16 MED ORDER — CHLORHEXIDINE GLUCONATE 0.12 % MT SOLN
15.0000 mL | Freq: Two times a day (BID) | OROMUCOSAL | Status: DC
  Administered 2012-11-16 – 2012-11-20 (×6): 15 mL via OROMUCOSAL
  Filled 2012-11-16 (×6): qty 15

## 2012-11-16 MED ORDER — ONDANSETRON HCL 4 MG/2ML IJ SOLN: 4.0000 mg | Freq: Four times a day (QID) | INTRAMUSCULAR | Status: DC | PRN

## 2012-11-16 MED ORDER — HYDROMORPHONE HCL PF 1 MG/ML IJ SOLN
1.0000 mg | INTRAMUSCULAR | Status: DC | PRN
  Administered 2012-11-16 – 2012-11-17 (×3): 1 mg via INTRAVENOUS
  Filled 2012-11-16 (×3): qty 1

## 2012-11-16 MED ORDER — BIOTENE DRY MOUTH MT LIQD
15.0000 mL | Freq: Two times a day (BID) | OROMUCOSAL | Status: DC
  Administered 2012-11-17 – 2012-11-19 (×5): 15 mL via OROMUCOSAL

## 2012-11-16 MED ORDER — ONDANSETRON HCL 4 MG PO TABS: 4.0000 mg | ORAL_TABLET | Freq: Four times a day (QID) | ORAL | Status: DC | PRN

## 2012-11-16 MED ORDER — SODIUM CHLORIDE 0.9 % IV BOLUS (SEPSIS)
1000.0000 mL | Freq: Once | INTRAVENOUS | Status: AC
  Administered 2012-11-16: 1000 mL via INTRAVENOUS

## 2012-11-16 MED ORDER — POTASSIUM CHLORIDE 10 MEQ/100ML IV SOLN
INTRAVENOUS | Status: AC
  Administered 2012-11-16: 10 meq via INTRAVENOUS
  Filled 2012-11-16: qty 100

## 2012-11-16 MED ORDER — ONDANSETRON HCL 4 MG/2ML IJ SOLN
4.0000 mg | Freq: Once | INTRAMUSCULAR | Status: AC
  Administered 2012-11-16: 4 mg via INTRAVENOUS
  Filled 2012-11-16: qty 2

## 2012-11-16 MED ORDER — TRIAMTERENE-HCTZ 37.5-25 MG PO TABS
1.0000 | ORAL_TABLET | Freq: Every day | ORAL | Status: DC
  Administered 2012-11-16 – 2012-11-20 (×4): 1 via ORAL
  Filled 2012-11-16 (×6): qty 1

## 2012-11-16 MED ORDER — SODIUM CHLORIDE 0.9 % IV SOLN
INTRAVENOUS | Status: AC
  Administered 2012-11-16 (×2): via INTRAVENOUS

## 2012-11-16 MED ORDER — ACETAMINOPHEN 325 MG PO TABS: 650.0000 mg | ORAL_TABLET | Freq: Four times a day (QID) | ORAL | Status: DC | PRN

## 2012-11-16 MED ORDER — HYDROMORPHONE HCL PF 1 MG/ML IJ SOLN
1.0000 mg | Freq: Once | INTRAMUSCULAR | Status: AC
  Administered 2012-11-16: 1 mg via INTRAVENOUS
  Filled 2012-11-16: qty 1

## 2012-11-16 MED ORDER — SODIUM CHLORIDE 0.9 % IV SOLN: INTRAVENOUS | Status: DC

## 2012-11-16 NOTE — ED Notes (Signed)
Patient returned from scan

## 2012-11-16 NOTE — ED Notes (Signed)
Patient unable to void at this time. Will alert staff when she needs to void.

## 2012-11-16 NOTE — ED Notes (Signed)
Pt complaining of right upper quadrant abdominal pain radiating to back. Pt was diagnosed with gallstones last year, and states that these symptoms are just like last time. Pt states she is experiencing more nausea this time.

## 2012-11-16 NOTE — ED Provider Notes (Signed)
History     CSN: [id number]  Arrival date & time 11/16/12  4098   First MD Initiated Contact with Patient 11/16/12 0654      Chief Complaint  Patient presents with  . Abdominal Pain    (Consider location/radiation/quality/duration/timing/severity/associated sxs/prior treatment) HPI  61 year old female with hx of gallstone presents c/o abd pain.  Pt report developing gradual onset of sharp pain to her epigastric region which started around 4pm yesterday and has been persistent until today.  Pain is 8/10, moderate in severity, radiates to her back, with nausea and felt similar to when she was diagnosed with having gallstones.  She has had similar pain in the past but it usually resolved after taking 1 hydrocodone pain pill.  She took 2 yesterday and 1 this morning without any relief.  Pain felt much worse as compare to when she was diagnosed with gallstone on [place].  Report eating only a baked potato yesterday.  No appetite.  Denies fever, chills, cp, sob, v/d, dysuria or rash.  No recent alcohol use.  Has prior hx of appendectomy.  Has no significant cardiac disease.    Past Medical History  Diagnosis Date  . Hypertension   . Gall bladder stones     Past Surgical History  Procedure Date  . Cesarean section   . Appendectomy   . I&d extremity 09/21/2012    Procedure: IRRIGATION AND DEBRIDEMENT EXTREMITY;  Surgeon: Nadara Mustard, MD;  Location: [facility] OR;  Service: Orthopedics;  Laterality: Left;  Excisional Debridement Left Thigh, apply wound vac  . Knee surgery     No family history on file.  History  Substance Use Topics  . Smoking status: Never Smoker   . Smokeless tobacco: Never Used  . Alcohol Use: No    OB History    Grav Para Term Preterm Abortions TAB SAB Ect Mult Living                  Review of Systems  Constitutional: Negative for chills.       A complete 10 system review of systems was obtained and all systems are negative except as noted in the HPI and PMH.    Respiratory: Negative for shortness of breath.   Cardiovascular: Negative for chest pain.  Gastrointestinal: Positive for nausea and abdominal pain. Negative for vomiting and diarrhea.  Genitourinary: Negative for dysuria.  Musculoskeletal: Positive for back pain.  Skin: Negative for rash.  Neurological: Negative for numbness.    Allergies  Morphine and related  Home Medications   Current Outpatient Rx  Name  Route  Sig  Dispense  Refill  . ATENOLOL 50 MG PO TABS   Oral   Take 50 mg by mouth daily.         Marland Kitchen HYDROCODONE-ACETAMINOPHEN 10-325 MG PO TABS   Oral   Take 1 tablet by mouth every 4 (four) hours as needed.   20 tablet   0   . HYDROCODONE-ACETAMINOPHEN 5-325 MG PO TABS   Oral   Take 1 tablet by mouth every 6 (six) hours as needed. For pain         . TRIAMTERENE-HCTZ 37.5-25 MG PO TABS   Oral   Take 1 tablet by mouth daily.             BP 119/49  Pulse 58  Resp 18  SpO2 100%  Physical Exam  Nursing note and vitals reviewed. Constitutional: She is oriented to person, place, and time. She  appears well-developed and well-nourished. No distress.       Awake, alert, nontoxic appearance  HENT:  Head: Atraumatic.  Mouth/Throat: No oropharyngeal exudate.  Eyes: Conjunctivae normal are normal. Right eye exhibits no discharge. Left eye exhibits no discharge.  Neck: Neck supple.  Cardiovascular: Normal rate and regular rhythm.   Pulmonary/Chest: Effort normal. No respiratory distress. She exhibits no tenderness.  Abdominal: Soft. There is no tenderness (epigastric tenderness on palpation without guarding or rebound tenderness.  Negative Murphy sign.  No hernia, no overlying skin changes.  ). There is no rebound.  Musculoskeletal: She exhibits no edema and no tenderness.       ROM appears intact, no obvious focal weakness  Neurological: She is alert and oriented to person, place, and time.       Mental status and motor strength appears intact  Skin: No rash  noted.  Psychiatric: She has a normal mood and affect.    ED Course  Procedures (including critical care time)  Results for orders placed during the hospital encounter of 11/16/12  CBC WITH DIFFERENTIAL      Component Value Range   WBC 6.7  4.0 - 10.5 K/uL   RBC 4.23  3.87 - 5.11 MIL/uL   Hemoglobin 13.1  12.0 - 15.0 g/dL   HCT 16.1  09.6 - 04.5 %   MCV 91.3  78.0 - 100.0 fL   MCH 31.0  26.0 - 34.0 pg   MCHC 33.9  30.0 - 36.0 g/dL   RDW 40.9  81.1 - 91.4 %   Platelets    150 - 400 K/uL   Value: PLATELET CLUMPS NOTED ON SMEAR, COUNT APPEARS ADEQUATE   Neutrophils Relative 82 (*) 43 - 77 %   Lymphocytes Relative 8 (*) 12 - 46 %   Monocytes Relative 10  3 - 12 %   Eosinophils Relative 0  0 - 5 %   Basophils Relative 0  0 - 1 %   Neutro Abs 5.5  1.7 - 7.7 K/uL   Lymphs Abs 0.5 (*) 0.7 - 4.0 K/uL   Monocytes Absolute 0.7  0.1 - 1.0 K/uL   Eosinophils Absolute 0.0  0.0 - 0.7 K/uL   Basophils Absolute 0.0  0.0 - 0.1 K/uL   Smear Review       Value: PLATELET CLUMPS NOTED ON SMEAR, COUNT APPEARS ADEQUATE  COMPREHENSIVE METABOLIC PANEL      Component Value Range   Sodium 138  135 - 145 mEq/L   Potassium 3.1 (*) 3.5 - 5.1 mEq/L   Chloride 98  96 - 112 mEq/L   CO2 27  19 - 32 mEq/L   Glucose, Bld 120 (*) 70 - 99 mg/dL   BUN 18  6 - 23 mg/dL   Creatinine, Ser 7.82  0.50 - 1.10 mg/dL   Calcium 9.1  8.4 - 95.6 mg/dL   Total Protein 7.7  6.0 - 8.3 g/dL   Albumin 3.5  3.5 - 5.2 g/dL   AST 213 (*) 0 - 37 U/L   ALT 472 (*) 0 - 35 U/L   Alkaline Phosphatase 380 (*) 39 - 117 U/L   Total Bilirubin 2.1 (*) 0.3 - 1.2 mg/dL   GFR calc non Af Amer >90  >90 mL/min   GFR calc Af Amer >90  >90 mL/min  LIPASE, BLOOD      Component Value Range   Lipase >3000 (*) 11 - 59 U/L   US Abdomen Complete  11/16/2012  *RADIOLOGY REPORT*  Clinical Data:  Rule out cholecystitis.  Gallstones.  ABDOMINAL ULTRASOUND COMPLETE  Comparison:  07/05/2012  Findings:  Gallbladder:  Stable multiple gallstones.  No  wall thickening, pericholecystic fluid, sonographic Murphy's sign.  Common Bile Duct:  Markedly dilated measuring up to 11 mm.  Liver: No focal mass lesion identified.  Within normal limits in parenchymal echogenicity.  IVC:  Appears normal.  Pancreas:  Grossly within normal limits.  No obvious mass or calculus.  Spleen:  Within normal limits in size and echotexture.  Right kidney:  Normal in size and parenchymal echogenicity.  No evidence of mass or hydronephrosis.  Left kidney:  Normal in size and parenchymal echogenicity.  No evidence of mass or hydronephrosis.  Abdominal Aorta:  No aneurysm identified.  IMPRESSION: Stable cholelithiasis.  No evidence of acute cholecystitis.  Common bile duct is markedly dilated.  Biliary obstruction is not excluded.  Further imaging of the pancreas with MRI or CT is recommended.   Original Report Authenticated By: Jolaine Click, M.D.       Date: 11/16/2012  Rate: 58  Rhythm: normal sinus rhythm  QRS Axis: normal  Intervals: normal  ST/T Wave abnormalities: nonspecific ST/T changes  Conduction Disutrbances:PVC  Narrative Interpretation:   Old EKG Reviewed: unchanged  1. Gallstone pancreatitis 2. transaminitis  MDM  Epigastric and RUQ abd pain, prior hx of gallstone.  Work up initiated.  Pain medication given. abd [place] ordered.  Will continue to monitor.    8:33 AM abd US show markedly dilated CBD measuring up to 77m.  Stable cholelithiasis, no evidence of acute cholecystitis.  Will await labs and will consider consulting with general surgeon or with GI for further care.  Care discussed with my attending.    9:50 AM Pt has gallstone pancreatitis with lipase >3000, AST 441, ALT 472, Alk Phoas 380 and total bili 2.1.  I have consulted GI specialist Dr. Madilyn Fireman, who request to have pt admitted to medicine and he will f/u.  Will consult unassigned medicine for admission.  Pt currently stable, comfortable and felt much better after receiving pain medication.  Pt is  aware of plan.     9:56 AM i have consulted with internal medicine-unassigned, who will see pt in ED and will admit for further care.  Pt will likely benefit from ERCP.  Pt made NPO.  Will continue IVF.    I have reviewed nursing notes and vital signs. I personally reviewed the imaging tests through PACS system  I reviewed available ER/hospitalization records thought the EMR  BP 117/52  Pulse 56  Temp 98.2 F (36.8 C) (Oral)  Resp 18  SpO2 100%    Fayrene Helper, PA-C 11/16/12 1052

## 2012-11-16 NOTE — ED Provider Notes (Signed)
Medical screening examination/treatment/procedure(s) were conducted as a shared visit with non-physician practitioner(s) and myself.  I personally evaluated the patient during the encounter  History of gallstones now with epigastric and right upper quadrant pain since yesterday that is constant.  Murphy's negative.  TTP in the RUQ and epigaastrium.  Elevated LFTS and lipase.  Concern for gallstone pancreatitis. No evidence of cholangitis.  Glynn Octave, MD 11/16/12 [phone]

## 2012-11-16 NOTE — ED Notes (Signed)
Patient gone for scan; will complete EKG when patient returns

## 2012-11-16 NOTE — ED Notes (Signed)
Report called to 6N

## 2012-11-16 NOTE — Consult Note (Signed)
Subjective:   HPI  The patient is a 61 year old female with a known history of gallstones discovered by ultrasound in the past. She states that over the past 6 months or so she has been experiencing episodes of upper abdominal pain on an intermittent basis. Yesterday afternoon she developed more upper abdominal pain with radiation to the back and some associated nausea. She came to the emergency room where an abdominal ultrasound was again done and showed gallstones and a dilated bile duct of 11 mm. Her lipase is 3000, and alkaline phosphatase as well as transaminases are also elevated. She is currently comfortable and not having any pain. She received some pain medication in the emergency room. She is generally healthy with a history of hypertension. She does not drink alcohol.  Review of Systems No chest pain or shortness of breath  Past Medical History  Diagnosis Date  . Hypertension   . Gall bladder stones    Past Surgical History  Procedure Date  . Cesarean section   . Appendectomy   . I&d extremity 09/21/2012    Procedure: IRRIGATION AND DEBRIDEMENT EXTREMITY;  Surgeon: Nadara Mustard, MD;  Location: [facility] OR;  Service: Orthopedics;  Laterality: Left;  Excisional Debridement Left Thigh, apply wound vac  . Knee surgery    History   Social History  . Marital Status: Divorced    Spouse Name: N/A    Number of Children: N/A  . Years of Education: N/A   Occupational History  . Not on file.   Social History Main Topics  . Smoking status: Never Smoker   . Smokeless tobacco: Never Used  . Alcohol Use: No  . Drug Use: No  . Sexually Active: Yes    Birth Control/ Protection: Post-menopausal   Other Topics Concern  . Not on file   Social History Narrative  . No narrative on file   family history is not on file. Current facility-administered medications:potassium chloride 10 mEq in 100 mL IVPB, 10 mEq, Intravenous, Q1 Hr x 4, Elyse Jarvis, MD Current outpatient  prescriptions:atenolol (TENORMIN) 50 MG tablet, Take 50 mg by mouth daily., Disp: , Rfl: ;  HYDROcodone-acetaminophen (NORCO) 10-325 MG per tablet, Take 1 tablet by mouth every 4 (four) hours as needed., Disp: 20 tablet, Rfl: 0;  OVER THE COUNTER MEDICATION, Take 1 tablet by mouth daily., Disp: , Rfl: ;  triamterene-hydrochlorothiazide (MAXZIDE-25) 37.5-25 MG per tablet, Take 1 tablet by mouth daily.  , Disp: , Rfl:  Allergies  Allergen Reactions  . Morphine And Related Other (See Comments)    Patient reports reaction was Very Severe.  From last surgery " [she] didn't wake up from surgery for 1 1/2 days and flipped out and went crazy/tripped out".     Objective:     BP 117/52  Pulse 56  Temp 98.2 F (36.8 C) (Oral)  Resp 18  SpO2 100%  She is alert and oriented and in no acute distress. Currently not in pain.  Heart regular rhythm no murmurs  Lungs clear  Abdomen: Bowel sounds are present, soft, nontender  Laboratory No components found with this basename: d1      Assessment:     Gallstone pancreatitis. There does not appear to be clinical evidence right now of descending cholangitis.      Plan:     Agree with admission to the hospital by the medicine team. We will follow her clinically. Would recommend IV fluids, consider antibiotics. She will eventually need ERCP but I don't  think it needs to be done emergently at this time. We would like to let her pancreatitis cool down, before proceeding with ERCP. If the clinical situation changes to where it appears she might be developing ascending cholangitis then emergent ERCP could be done.  Lab Results  Component Value Date   HGB 13.1 11/16/2012   HGB 12.1 09/20/2012   HGB 13.0 09/19/2012   HCT 38.6 11/16/2012   HCT 37.6 09/20/2012   HCT 40.2 09/19/2012   ALKPHOS 380* 11/16/2012   ALKPHOS 342* 09/20/2012   ALKPHOS 375* 09/19/2012   AST 441* 11/16/2012   AST 342* 09/20/2012   AST 227* 09/19/2012   ALT 472* 11/16/2012   ALT 309*  09/20/2012   ALT 268* 09/19/2012

## 2012-11-16 NOTE — Progress Notes (Signed)
Utilization review completed. Vivion Romano, RN, BSN. 

## 2012-11-16 NOTE — H&P (Signed)
Hospital Admission Note Date: 11/16/2012  Patient name: Jennifer [name] Medical record number: [id number] Date of birth: 10-01-1951 Age: 61 y.o. Gender: female PCP: Pcp Not In System  Medical Service: Internal Medicine  Attending physician: Dr. Criselda Peaches    Pager: Resident (R2/R3): Dr. Bosie Clos    Pager:[phone] Resident (R1):Dr. Heloise Beecham     Pager: [phone]  Chief Complaint: abdominal pain and nausea x 1 day  History of Present Illness: 61 year old woman with past medical history significant for hypertension, cholethiasis presents to the ER with chief complaint of abdominal pain and nausea.  Patient was in her usual state of health until yesterday afternoon when she started having abdominal pain around 2 PM. She states that her pain was getting progressively worse and she took pain pills around 4 PM and 9:30 PM ( vicodin given to her with her last ED visit) but that didn't help her much. She woke up around 5 AM with severe pain and took another pill but that didn't help either and she decided to come to the ER for further evaluation. She describes her pain as sharp pain located in the epigastrium, 8/10 when its worse, radiating to the back with no aggravating or relieving factors. She also reports associated nausea but denies any vomiting. She also denies any fevers, chills, diarrhea or constipation, or decrease in appetite.  She presented with a similar complaint about 4-5 months ago ( but no this severe) when she was told that she has gallstones and should go for an elective cholecystitis whenever she wants.  No current facility-administered medications for this encounter.   Current Outpatient Prescriptions  Medication Sig Dispense Refill  . atenolol (TENORMIN) 50 MG tablet Take 50 mg by mouth daily.      Marland Kitchen HYDROcodone-acetaminophen (NORCO) 10-325 MG per tablet Take 1 tablet by mouth every 4 (four) hours as needed.  20 tablet  0  . OVER THE COUNTER MEDICATION Take 1 tablet by mouth daily.        Marland Kitchen triamterene-hydrochlorothiazide (MAXZIDE-25) 37.5-25 MG per tablet Take 1 tablet by mouth daily.          Allergies: Morphine and related  Past Medical History  Diagnosis Date  . Hypertension   . Gall bladder stones     Past Surgical History  Procedure Date  . Cesarean section   . Appendectomy   . I&d extremity 09/21/2012    Procedure: IRRIGATION AND DEBRIDEMENT EXTREMITY;  Surgeon: Nadara Mustard, MD;  Location: [facility] OR;  Service: Orthopedics;  Laterality: Left;  Excisional Debridement Left Thigh, apply wound vac  . Knee surgery     No family history on file.  History   Social History  . Marital Status: Divorced    Spouse Name: N/A    Number of Children: N/A  . Years of Education: N/A   Occupational History  . Not on file.   Social History Main Topics  . Smoking status: Never Smoker   . Smokeless tobacco: Never Used  . Alcohol Use: No  . Drug Use: No  . Sexually Active: Yes    Birth Control/ Protection: Post-menopausal   Other Topics Concern  . Not on file   Social History Narrative  . No narrative on file    Review of Systems: Pertinent items are noted in HPI.  Physical Exam:  Filed Vitals:   11/16/12 0628  BP: 119/49  Pulse: 58  Resp: 18  SpO2: 100%   General appearance: alert, cooperative and no distress Head: Normocephalic, without  obvious abnormality, atraumatic Lungs: clear to auscultation bilaterally Heart: regular rate and rhythm, S1, S2 normal, no murmur, click, rub or gallop Abdomen: soft , tender to palpation in the epigastrium, no organomegaly, murphy sign negative Extremities: extremities normal, atraumatic, no cyanosis or edema Pulses: 2+ and symmetric Skin: Skin color, texture, turgor normal. No rashes or lesions Neurologic: Alert and oriented X 3, normal strength and tone. Normal symmetric reflexes. Normal coordination and gait   Lab results: CBC:    Component Value Date/Time   WBC 6.7 11/16/2012 0838   HGB 13.1 11/16/2012  0838   HCT 38.6 11/16/2012 0838   PLT PLATELET CLUMPS NOTED ON SMEAR, COUNT APPEARS ADEQUATE 11/16/2012 0838   MCV 91.3 11/16/2012 0838   NEUTROABS 5.5 11/16/2012 0838   LYMPHSABS 0.5* 11/16/2012 0838   MONOABS 0.7 11/16/2012 0838   EOSABS 0.0 11/16/2012 0838   BASOSABS 0.0 11/16/2012 0838     Comprehensive Metabolic Panel:    Component Value Date/Time   NA 138 11/16/2012 0838   K 3.1* 11/16/2012 0838   CL 98 11/16/2012 0838   CO2 27 11/16/2012 0838   BUN 18 11/16/2012 0838   CREATININE 0.60 11/16/2012 0838   GLUCOSE 120* 11/16/2012 0838   CALCIUM 9.1 11/16/2012 0838   AST 441* 11/16/2012 0838   ALT 472* 11/16/2012 0838   ALKPHOS 380* 11/16/2012 0838   BILITOT 2.1* 11/16/2012 0838   PROT 7.7 11/16/2012 0838   ALBUMIN 3.5 11/16/2012 0838    Lipase     Component Value Date/Time   LIPASE >3000* 11/16/2012 1610      Imaging results: Ultrasound of abdomen: Stable cholelithiasis. No evidence of acute cholecystitis.  Common bile duct is markedly dilated. Biliary obstruction is not  excluded. Further imaging of the pancreas with MRI or CT is  recommended.  EKG: sinus bradycardia, no acute ST - T wave changes, Intervals normal.   Assessment & Plan by Problem:  # Gallstone pancreatitis: Patient presents with abdominal pain, radiating to the back along with nausea. Her LFT's show elevated transaminases and elevated ALP  likely consistent with biliary obstruction due to probably choledocholithiasis.She has abdominal ultrasound that shows marked biliary duct dilatation with no evidence of acute cholecystitis. No evidence of pancreatitis on ultrasound but is not the best study Given her lipase in 3000's , would treat it as pancreatitis . Would defer any futher imaging for now. GI consult has already been called by ED physician. - Admit to med- surg bed - Symptomatcic treatment for now including:  - NPO - Dilaudid 1 mg q4 PRN for pain control - Zofran - IVF -Await GI recs-  patient would most likely be needing ERCP   # Elevated LFT's: see #1  # Hypokalemia: Likely 2/2 nausea, poor oral intake , being on diuretic and probable contraction alkalosis. Check Mg.  Replete  # Bradycardia: HR in high 50's . Patient is currently asymptomatic. EKG reviewed. Her HR was noted to be low with previous ED visits as well. Hold Atenolol Continue to monitor  # HTN:BP stable. Resume  Triamterene HCTZ  # DVT: Lovenox  # Dispo: She does not have a PCP, we need to give her information on PCP's at d/c. I expect her to be here for 2 -3 days.

## 2012-11-16 NOTE — ED Notes (Signed)
PT c/o epigstric pain.  Hx of gallstones.  This pain feels the same but MUCH worse.

## 2012-11-17 DIAGNOSIS — K859 Acute pancreatitis without necrosis or infection, unspecified: Secondary | ICD-10-CM

## 2012-11-17 DIAGNOSIS — K8010 Calculus of gallbladder with chronic cholecystitis without obstruction: Secondary | ICD-10-CM

## 2012-11-17 DIAGNOSIS — E876 Hypokalemia: Secondary | ICD-10-CM

## 2012-11-17 DIAGNOSIS — R109 Unspecified abdominal pain: Secondary | ICD-10-CM

## 2012-11-17 DIAGNOSIS — K8050 Calculus of bile duct without cholangitis or cholecystitis without obstruction: Secondary | ICD-10-CM

## 2012-11-17 DIAGNOSIS — I10 Essential (primary) hypertension: Secondary | ICD-10-CM

## 2012-11-17 LAB — CBC
Hemoglobin: 11.8 g/dL — ABNORMAL LOW (ref 12.0–15.0)
MCV: 93.3 fL (ref 78.0–100.0)
Platelets: 180 10*3/uL (ref 150–400)
RBC: 3.86 MIL/uL — ABNORMAL LOW (ref 3.87–5.11)
WBC: 4.9 10*3/uL (ref 4.0–10.5)

## 2012-11-17 LAB — COMPREHENSIVE METABOLIC PANEL
Albumin: 3.1 g/dL — ABNORMAL LOW (ref 3.5–5.2)
BUN: 12 mg/dL (ref 6–23)
Calcium: 8.7 mg/dL (ref 8.4–10.5)
Chloride: 100 mEq/L (ref 96–112)
Creatinine, Ser: 0.65 mg/dL (ref 0.50–1.10)
GFR calc non Af Amer: >90 mL/min (ref >90)
Total Bilirubin: 0.6 mg/dL (ref 0.3–1.2)

## 2012-11-17 LAB — GLUCOSE, CAPILLARY: Glucose-Capillary: 65 mg/dL — ABNORMAL LOW (ref 70–99)

## 2012-11-17 MED ORDER — METOPROLOL SUCCINATE ER 50 MG PO TB24
50.0000 mg | ORAL_TABLET | Freq: Every day | ORAL | Status: DC
  Administered 2012-11-17: 50 mg via ORAL
  Filled 2012-11-17 (×2): qty 1

## 2012-11-17 MED ORDER — HYDROCODONE-ACETAMINOPHEN 5-325 MG PO TABS
1.0000 | ORAL_TABLET | ORAL | Status: DC | PRN
  Administered 2012-11-17: 1 via ORAL
  Administered 2012-11-19 – 2012-11-20 (×4): 2 via ORAL
  Filled 2012-11-17: qty 2
  Filled 2012-11-17: qty 1
  Filled 2012-11-17 (×3): qty 2

## 2012-11-17 MED ORDER — POTASSIUM CHLORIDE 10 MEQ/100ML IV SOLN
10.0000 meq | INTRAVENOUS | Status: DC
  Administered 2012-11-17: 10 meq via INTRAVENOUS
  Filled 2012-11-17: qty 100

## 2012-11-17 MED ORDER — POTASSIUM CHLORIDE 10 MEQ/100ML IV SOLN: 10.0000 meq | INTRAVENOUS | Status: DC

## 2012-11-17 MED ORDER — SILVER SULFADIAZINE 1 % EX CREA
TOPICAL_CREAM | Freq: Two times a day (BID) | CUTANEOUS | Status: DC
  Administered 2012-11-17 (×2): via TOPICAL
  Administered 2012-11-18: 1 via TOPICAL
  Administered 2012-11-18: 21:00:00 via TOPICAL
  Administered 2012-11-19: 1 via TOPICAL
  Administered 2012-11-20: 10:00:00 via TOPICAL
  Filled 2012-11-17: qty 85

## 2012-11-17 NOTE — Progress Notes (Signed)
[facility] Progress Note  Subjective: The patient is doing well today with minimal abdominal pain in the upper abdomen. Denies nausea or vomiting. She was seen earlier today by the surgical service.  Objective: Vital signs in last 24 hours: Temp:  [97.8 F (36.6 C)-99.2 F (37.3 C)] 99.2 F (37.3 C) (12/31 0635) Pulse Rate:  [56-65] 65  (12/31 0635) Resp:  [19-20] 20  (12/31 0635) BP: (123-143)/(62-82) 143/62 mmHg (12/31 0635) SpO2:  [99 %] 99 % (12/31 0635) Weight:  [74.844 kg (165 lb)] 74.844 kg (165 lb) (12/30 2201) Weight change:    PE  She is in no distress  Heart regular rhythm  Lungs clear  Abdomen is soft with minimal epigastric tenderness  Lab Results: Results for orders placed during the hospital encounter of 11/16/12 (from the past 24 hour(s))  URINALYSIS, ROUTINE W REFLEX MICROSCOPIC     Status: Abnormal   Collection Time   11/16/12 11:19 AM      Component Value Range   Color, Urine AMBER (*) YELLOW   APPearance CLEAR  CLEAR   Specific Gravity, Urine 1.024  1.005 - 1.030   pH 5.5  5.0 - 8.0   Glucose, UA NEGATIVE  NEGATIVE mg/dL   Hgb urine dipstick NEGATIVE  NEGATIVE   Bilirubin Urine SMALL (*) NEGATIVE   Ketones, ur NEGATIVE  NEGATIVE mg/dL   Protein, ur NEGATIVE  NEGATIVE mg/dL   Urobilinogen, UA 1.0  0.0 - 1.0 mg/dL   Nitrite NEGATIVE  NEGATIVE   Leukocytes, UA SMALL (*) NEGATIVE  URINE MICROSCOPIC-ADD ON     Status: Abnormal   Collection Time   11/16/12 11:19 AM      Component Value Range   Squamous Epithelial / LPF FEW (*) RARE   WBC, UA 3-6  <3 WBC/hpf   Bacteria, UA RARE  RARE   Urine-Other MUCOUS PRESENT    CBC     Status: Normal   Collection Time   11/16/12  4:19 PM      Component Value Range   WBC 6.6  4.0 - 10.5 K/uL   RBC 4.22  3.87 - 5.11 MIL/uL   Hemoglobin 12.8  12.0 - 15.0 g/dL   HCT 96.0  45.4 - 09.8 %   MCV 91.7  78.0 - 100.0 fL   MCH 30.3  26.0 - 34.0 pg   MCHC 33.1  30.0 - 36.0 g/dL   RDW 11.9  14.7 - 82.9  %   Platelets 179  150 - 400 K/uL  CREATININE, SERUM     Status: Normal   Collection Time   11/16/12  4:19 PM      Component Value Range   Creatinine, Ser 0.57  0.50 - 1.10 mg/dL   GFR calc non Af Amer >90  >90 mL/min   GFR calc Af Amer >90  >90 mL/min  COMPREHENSIVE METABOLIC PANEL     Status: Abnormal   Collection Time   11/17/12  5:10 AM      Component Value Range   Sodium 136  135 - 145 mEq/L   Potassium 3.3 (*) 3.5 - 5.1 mEq/L   Chloride 100  96 - 112 mEq/L   CO2 26  19 - 32 mEq/L   Glucose, Bld 64 (*) 70 - 99 mg/dL   BUN 12  6 - 23 mg/dL   Creatinine, Ser 5.62  0.50 - 1.10 mg/dL   Calcium 8.7  8.4 - 13.0 mg/dL   Total Protein 6.6  6.0 - 8.3 g/dL  Albumin 3.1 (*) 3.5 - 5.2 g/dL   AST 161 (*) 0 - 37 U/L   ALT 266 (*) 0 - 35 U/L   Alkaline Phosphatase 289 (*) 39 - 117 U/L   Total Bilirubin 0.6  0.3 - 1.2 mg/dL   GFR calc non Af Amer >90  >90 mL/min   GFR calc Af Amer >90  >90 mL/min  CBC     Status: Abnormal   Collection Time   11/17/12  5:10 AM      Component Value Range   WBC 4.9  4.0 - 10.5 K/uL   RBC 3.86 (*) 3.87 - 5.11 MIL/uL   Hemoglobin 11.8 (*) 12.0 - 15.0 g/dL   HCT 09.6  04.5 - 40.9 %   MCV 93.3  78.0 - 100.0 fL   MCH 30.6  26.0 - 34.0 pg   MCHC 32.8  30.0 - 36.0 g/dL   RDW 81.1  91.4 - 78.2 %   Platelets 180  150 - 400 K/uL  LIPASE, BLOOD     Status: Abnormal   Collection Time   11/17/12  5:10 AM      Component Value Range   Lipase 180 (*) 11 - 59 U/L    Studies/Results: @RISRSLT24 @    Assessment: Gallstone pancreatitis. There has been improvement in her lipase as well as LFTs. She has been seen by surgery today.  Plan: The plan at this particular time is to proceed with laparoscopic cholecystectomy with intraoperative cholangiogram on Thursday or Friday. We will be on standby if needed for ERCP in the event of a positive intraoperative cholangiogram. Call us if needed.    Graylin Shiver 11/17/2012, 10:57 AM  Lab Results  Component Value  Date   HGB 11.8* 11/17/2012   HGB 12.8 11/16/2012   HGB 13.1 11/16/2012   HCT 36.0 11/17/2012   HCT 38.7 11/16/2012   HCT 38.6 11/16/2012   ALKPHOS 289* 11/17/2012   ALKPHOS 380* 11/16/2012   ALKPHOS 342* 09/20/2012   AST 164* 11/17/2012   AST 441* 11/16/2012   AST 342* 09/20/2012   ALT 266* 11/17/2012   ALT 472* 11/16/2012   ALT 309* 09/20/2012

## 2012-11-17 NOTE — Progress Notes (Signed)
Pt is refusing to get IV potasium. Rn notified Md

## 2012-11-17 NOTE — H&P (Signed)
Internal Medicine Teaching Service Attending Note Date: 11/17/2012  Patient name: Jennifer [name]  Medical record number: [id number]  Date of birth: May 11, 1951   I have seen and evaluated Jennifer [name] and discussed their care with the Residency Team.    [name] is a 61yo woman with PMH of HTN and cholelithiasis who presented to the [facility] ED with abdominal pain, mostly in the epigastric region and radiating to the back and nausea without vomiting.  She notes that the pain started on the day prior to admission and progressively got worse, it was intermittent in nature but did not seem to get worse with food.  She took some pain pills a couple of hours after it started, but this did not help much.  She was able to sleep, but the pain awoke her from sleep at 5am on the day of admission and she presented to the ED.  The pain is described as sharp, 8/10 at its worst.  She describes some worsening with movement, but otherwise no aggravating or alleviating factors.  She further denies fever chills, diarrhea, constipation or decrease in appetite.  She is requesting to drink water.    She had a similar event about 5 months prior to this one, but not as severe.  She was told she had gallstones and that she could have an elective cholecystecomy.   Please see resident note for further information on PMH, PFSH, meds, allergies and further ROS.   Physical Exam: Blood pressure 143/62, pulse 65, temperature 99.2 F (37.3 C), temperature source Oral, resp. rate 20, height 4\' 11"  (1.499 m), weight 165 lb (74.844 kg), SpO2 99.00%. General appearance: alert, cooperative and appears stated age Head: Normocephalic, without obvious abnormality, atraumatic Eyes: EOMI, no icterus noted Lungs: clear to auscultation bilaterally and no wheezes Heart: NR, RR, no murmur noted Abdomen: Soft, tender in epigastrium mildly (recently had pain medication), no distention Extremities: no edema, perfused well Skin: no rash,  wound  Lab results: Results for orders placed during the hospital encounter of 11/16/12 (from the past 24 hour(s))  CBC     Status: Normal   Collection Time   11/16/12  4:19 PM      Component Value Range   WBC 6.6  4.0 - 10.5 K/uL   RBC 4.22  3.87 - 5.11 MIL/uL   Hemoglobin 12.8  12.0 - 15.0 g/dL   HCT 46.9  62.9 - 52.8 %   MCV 91.7  78.0 - 100.0 fL   MCH 30.3  26.0 - 34.0 pg   MCHC 33.1  30.0 - 36.0 g/dL   RDW 41.3  24.4 - 01.0 %   Platelets 179  150 - 400 K/uL  CREATININE, SERUM     Status: Normal   Collection Time   11/16/12  4:19 PM      Component Value Range   Creatinine, Ser 0.57  0.50 - 1.10 mg/dL   GFR calc non Af Amer >90  >90 mL/min   GFR calc Af Amer >90  >90 mL/min  COMPREHENSIVE METABOLIC PANEL     Status: Abnormal   Collection Time   11/17/12  5:10 AM      Component Value Range   Sodium 136  135 - 145 mEq/L   Potassium 3.3 (*) 3.5 - 5.1 mEq/L   Chloride 100  96 - 112 mEq/L   CO2 26  19 - 32 mEq/L   Glucose, Bld 64 (*) 70 - 99 mg/dL   BUN 12  6 - 23 mg/dL   Creatinine, Ser 1.19  0.50 - 1.10 mg/dL   Calcium 8.7  8.4 - 14.7 mg/dL   Total Protein 6.6  6.0 - 8.3 g/dL   Albumin 3.1 (*) 3.5 - 5.2 g/dL   AST 829 (*) 0 - 37 U/L   ALT 266 (*) 0 - 35 U/L   Alkaline Phosphatase 289 (*) 39 - 117 U/L   Total Bilirubin 0.6  0.3 - 1.2 mg/dL   GFR calc non Af Amer >90  >90 mL/min   GFR calc Af Amer >90  >90 mL/min  CBC     Status: Abnormal   Collection Time   11/17/12  5:10 AM      Component Value Range   WBC 4.9  4.0 - 10.5 K/uL   RBC 3.86 (*) 3.87 - 5.11 MIL/uL   Hemoglobin 11.8 (*) 12.0 - 15.0 g/dL   HCT 56.2  13.0 - 86.5 %   MCV 93.3  78.0 - 100.0 fL   MCH 30.6  26.0 - 34.0 pg   MCHC 32.8  30.0 - 36.0 g/dL   RDW 78.4  69.6 - 29.5 %   Platelets 180  150 - 400 K/uL  LIPASE, BLOOD     Status: Abnormal   Collection Time   11/17/12  5:10 AM      Component Value Range   Lipase 180 (*) 11 - 59 U/L    Imaging results:  US Abdomen Complete  11/16/2012   *RADIOLOGY REPORT*  Clinical Data:  Rule out cholecystitis.  Gallstones.  ABDOMINAL ULTRASOUND COMPLETE  Comparison:  07/05/2012  Findings:  Gallbladder:  Stable multiple gallstones.  No wall thickening, pericholecystic fluid, sonographic Murphy's sign.  Common Bile Duct:  Markedly dilated measuring up to 11 mm.  Liver: No focal mass lesion identified.  Within normal limits in parenchymal echogenicity.  IVC:  Appears normal.  Pancreas:  Grossly within normal limits.  No obvious mass or calculus.  Spleen:  Within normal limits in size and echotexture.  Right kidney:  Normal in size and parenchymal echogenicity.  No evidence of mass or hydronephrosis.  Left kidney:  Normal in size and parenchymal echogenicity.  No evidence of mass or hydronephrosis.  Abdominal Aorta:  No aneurysm identified.  IMPRESSION: Stable cholelithiasis.  No evidence of acute cholecystitis.  Common bile duct is markedly dilated.  Biliary obstruction is not excluded.  Further imaging of the pancreas with MRI or CT is recommended.   Original Report Authenticated By: Jolaine Click, M.D.     Assessment and Plan: I agree with the formulated Assessment and Plan with the following changes:   1. Pancreatitis due to gallstones - Lab and imaging evidence points to this diagnosis (LFT elevated, Lipase > 3000) - Agree with NPO, pain control, IVF and nausea control as noted in resident note - GI consult placed and considering ERCP once pancreatitis improves, consider Gen surg consult for cholecystectomy - Follow up on labwork.   2. Bradycardia - Likely due to atenolol, will hold for now - Consider changing to another agent that will not decrease HR to such a level  Other issues as per resident note.   Inez Catalina, MD 12/31/201311:39 AM

## 2012-11-17 NOTE — Progress Notes (Signed)
Pt blood sugar was mistakenly taken at 0730 and it was 65. Rn will notify MD

## 2012-11-17 NOTE — Progress Notes (Signed)
Subjective: Patient sitting up at table working on a puzzle. Slept well overnight without requesting pain meds. Is thirsty and hungry, denies nausea and only slight tenderness of epigastrum. This morning labs show interval decrement in transaminases and lipase.  Denies CP, SOB,dizziness, diarrhea.  Objective: Vital signs in last 24 hours: Filed Vitals:   11/16/12 1417 11/16/12 2201 11/16/12 2301 11/17/12 0635  BP: 123/82  125/80 143/62  Pulse: 58  56 65  Temp: 98.6 F (37 C)  97.8 F (36.6 C) 99.2 F (37.3 C)  TempSrc: Oral     Resp: 19  20 20   Height:  4\' 11"  (1.499 m)    Weight:  165 lb (74.844 kg)    SpO2: 99%  99% 99%   Weight change:   Intake/Output Summary (Last 24 hours) at 11/17/12 0848 Last data filed at 11/17/12 0500  Gross per 24 hour  Intake   2210 ml  Output      0 ml  Net   2210 ml   Vitals reviewed. General: sitting in chair working on puzzle HEENT: PERRL, EOMI, no scleral icterus Cardiac: RRR, no rubs, murmurs or gallops Pulm: clear to auscultation bilaterally, no wheezes, rales, or rhonchi Abd: mild tenderness with deep palpation of epigastrum Ext: warm and well perfused, no pedal edema Neuro: alert and oriented X3, cranial nerves II-XII grossly intact, strength and sensation to light touch equal in bilateral upper and lower extremities  Lab Results: Basic Metabolic Panel:  Lab 11/17/12 1610 11/16/12 1619 11/16/12 0838  NA 136 -- 138  K 3.3* -- 3.1*  CL 100 -- 98  CO2 26 -- 27  GLUCOSE 64* -- 120*  BUN 12 -- 18  CREATININE 0.65 0.57 --  CALCIUM 8.7 -- 9.1  MG -- -- 2.1  PHOS -- -- --   Liver Function Tests:  Lab 11/17/12 0510 11/16/12 0838  AST 164* 441*  ALT 266* 472*  ALKPHOS 289* 380*  BILITOT 0.6 2.1*  PROT 6.6 7.7  ALBUMIN 3.1* 3.5    Lab 11/17/12 0510 11/16/12 0838  LIPASE 180* >3000*  AMYLASE -- --   No results found for this basename: AMMONIA:2 in the last 168 hours CBC:  Lab 11/17/12 0510 11/16/12 1619 11/16/12 0838    WBC 4.9 6.6 --  NEUTROABS -- -- 5.5  HGB 11.8* 12.8 --  HCT 36.0 38.7 --  MCV 93.3 91.7 --  PLT 180 179 --   Urinalysis:  Lab 11/16/12 1119  COLORURINE AMBER*  LABSPEC 1.024  PHURINE 5.5  GLUCOSEU NEGATIVE  HGBUR NEGATIVE  BILIRUBINUR SMALL*  KETONESUR NEGATIVE  PROTEINUR NEGATIVE  UROBILINOGEN 1.0  NITRITE NEGATIVE  LEUKOCYTESUR SMALL*   Studies/Results: US Abdomen Complete  11/16/2012  *RADIOLOGY REPORT*  Clinical Data:  Rule out cholecystitis.  Gallstones.  ABDOMINAL ULTRASOUND COMPLETE  Comparison:  07/05/2012  Findings:  Gallbladder:  Stable multiple gallstones.  No wall thickening, pericholecystic fluid, sonographic Murphy's sign.  Common Bile Duct:  Markedly dilated measuring up to 11 mm.  Liver: No focal mass lesion identified.  Within normal limits in parenchymal echogenicity.  IVC:  Appears normal.  Pancreas:  Grossly within normal limits.  No obvious mass or calculus.  Spleen:  Within normal limits in size and echotexture.  Right kidney:  Normal in size and parenchymal echogenicity.  No evidence of mass or hydronephrosis.  Left kidney:  Normal in size and parenchymal echogenicity.  No evidence of mass or hydronephrosis.  Abdominal Aorta:  No aneurysm identified.  IMPRESSION: Stable cholelithiasis.  No evidence of acute cholecystitis.  Common bile duct is markedly dilated.  Biliary obstruction is not excluded.  Further imaging of the pancreas with MRI or CT is recommended.   Original Report Authenticated By: Jolaine Click, M.D.    Medications: I have reviewed the patient's current medications. Scheduled Meds:   . antiseptic oral rinse  15 mL Mouth Rinse q12n4p  . chlorhexidine  15 mL Mouth Rinse BID  . enoxaparin (LOVENOX) injection  40 mg Subcutaneous Q24H  . potassium chloride  10 mEq Intravenous Q1 Hr x 4  . triamterene-hydrochlorothiazide  1 each Oral Daily   Continuous Infusions:   . sodium chloride 125 mL/hr at 11/16/12 2123   PRN Meds:.acetaminophen,  acetaminophen, HYDROmorphone (DILAUDID) injection, ondansetron (ZOFRAN) IV, ondansetron  Assessment/Plan: [name] is a 61 yo female with past medical history significant for hypertension and cholethiasis presenting with abdominal pain and nausea, found to have gallstone pancreatitis and dilated common bile duct.   1) Gallstone pancreatitis Symptoms have improved overnight w IVF hydration. Pt did not awake to ask for any pain meds. Transaminases and lipase downtrending.She was noted to have a dilated bile duct of 11 mm on abdominal U/S yesterday. GI evaluated (Dr. Andrez Grime), patient may still require ERCP even with clinical improvement. GI recommends consulting surgery for coordination of care and discussion of timing of procedures.  - Continue IVF - Will keep NPO pending feedback from surgery about timing of cholecystectomy.   2) Hypokalemia Potassium 3.1 on admission, 3.3 today.  - Will replete again.   3) Hypertension Patient on atenolol and maxide at home. Atenolol was held on admission as HR was in the 50s. Has had some borderline high pressures, will substitute metoprolol at lower starting dose and titrate up as needed.  - Maxide and metoprolol SR 50mg   DVT prophy -  lovenox  Dispo: Disposition is deferred at this time, awaiting improvement of current medical problems.  Anticipated discharge in approximately 2-3 day(s).   The patient does have a current PCP (Pcp Not In System), therefore will not be requiring OPC follow-up after discharge.   The patient does not have transportation limitations that hinder transportation to clinic appointments.  .Services Needed at time of discharge: Y = Yes, Blank = No PT:   OT:   RN:   Equipment:   Other:     LOS: 1 day   Bronson Curb 11/17/2012, 8:48 AM

## 2012-11-17 NOTE — Consult Note (Signed)
General Surgery [facility]  Patient seen and examined.  Discussed with Will Marlyne Beards.  Mild upper abdominal tenderness noted.  Patient with recurrent episode of biliary pancreatitis.  Clinically improving.  Will require cholecystectomy this admission.  Will monitor exam and labs for 24-48 hours.  Tentatively plan cholecystectomy 1/2 or 1/3 if clinically improved.  Thank you.  Velora Heckler, MD, [facility] Office: [phone]
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
## 2012-11-17 NOTE — Progress Notes (Signed)
Patient has own silvadene at beside and request that we use hers.

## 2012-11-18 LAB — CBC
HCT: 35.9 % — ABNORMAL LOW (ref 36.0–46.0)
Hemoglobin: 12 g/dL (ref 12.0–15.0)
MCH: 30.5 pg (ref 26.0–34.0)
MCV: 91.3 fL (ref 78.0–100.0)
RBC: 3.93 MIL/uL (ref 3.87–5.11)

## 2012-11-18 LAB — COMPREHENSIVE METABOLIC PANEL
BUN: 9 mg/dL (ref 6–23)
CO2: 30 mEq/L (ref 19–32)
Calcium: 9.2 mg/dL (ref 8.4–10.5)
Chloride: 102 mEq/L (ref 96–112)
Creatinine, Ser: 0.66 mg/dL (ref 0.50–1.10)
GFR calc Af Amer: >90 mL/min (ref >90)
GFR calc non Af Amer: >90 mL/min (ref >90)
Glucose, Bld: 98 mg/dL (ref 70–99)
Total Bilirubin: 0.6 mg/dL (ref 0.3–1.2)

## 2012-11-18 MED ORDER — POTASSIUM CHLORIDE CRYS ER 20 MEQ PO TBCR
40.0000 meq | EXTENDED_RELEASE_TABLET | Freq: Once | ORAL | Status: AC
  Administered 2012-11-18: 40 meq via ORAL
  Filled 2012-11-18 (×2): qty 2

## 2012-11-18 MED ORDER — CIPROFLOXACIN IN D5W 400 MG/200ML IV SOLN
400.0000 mg | INTRAVENOUS | Status: AC
  Administered 2012-11-19: 400 mg via INTRAVENOUS
  Filled 2012-11-18: qty 200

## 2012-11-18 MED ORDER — SODIUM CHLORIDE 0.9 % IV SOLN
INTRAVENOUS | Status: DC
  Administered 2012-11-18: 21:00:00 via INTRAVENOUS

## 2012-11-18 MED ORDER — METOPROLOL SUCCINATE ER 100 MG PO TB24
100.0000 mg | ORAL_TABLET | Freq: Every day | ORAL | Status: DC
  Administered 2012-11-18 – 2012-11-20 (×3): 100 mg via ORAL
  Filled 2012-11-18 (×4): qty 1

## 2012-11-18 NOTE — Progress Notes (Signed)
Subjective: No complaints. Hungry, no more abdominal pain. Tolerated clear diet well. Continued downtrend of transaminases and lipase. Denies CP, SOB, N/V, dizziness, abdominal pain, diarrhea.   Objective: Vital signs in last 24 hours: Filed Vitals:   11/17/12 0635 11/17/12 1425 11/17/12 2229 11/18/12 0525  BP: 143/62 147/66 120/44 159/51  Pulse: 65 72 63 61  Temp: 99.2 F (37.3 C) 98.8 F (37.1 C) 99.3 F (37.4 C) 98.4 F (36.9 C)  TempSrc:  Oral Oral Oral  Resp: 20 20 18 16   Height:      Weight:      SpO2: 99% 100% 98% 99%   Weight change:   Intake/Output Summary (Last 24 hours) at 11/18/12 1610 Last data filed at 11/18/12 0600  Gross per 24 hour  Intake   1375 ml  Output      0 ml  Net   1375 ml   Vitals reviewed. General: sitting in chair, drinking Diet Coke HEENT: PERRL, EOMI, no scleral icterus Cardiac: RRR, no rubs, murmurs or gallops Pulm: clear to auscultation bilaterally, no wheezes, rales, or rhonchi Abd: no tenderness to deep palpation of epigastrum Ext: warm and well perfused, no pedal edema Neuro: alert and oriented X3, cranial nerves II-XII grossly intact, strength and sensation to light touch equal in bilateral upper and lower extremities  Lab Results: Basic Metabolic Panel:  Lab 11/18/12 9604 11/17/12 0510 11/16/12 0838  NA 141 136 --  K 3.2* 3.3* --  CL 102 100 --  CO2 30 26 --  GLUCOSE 98 64* --  BUN 9 12 --  CREATININE 0.66 0.65 --  CALCIUM 9.2 8.7 --  MG -- -- 2.1  PHOS -- -- --   Liver Function Tests:  Lab 11/18/12 0625 11/17/12 0510  AST 74* 164*  ALT 185* 266*  ALKPHOS 261* 289*  BILITOT 0.6 0.6  PROT 7.1 6.6  ALBUMIN 3.2* 3.1*    Lab 11/18/12 0625 11/17/12 0510  LIPASE 60* 180*  AMYLASE -- --   CBC:  Lab 11/18/12 0625 11/17/12 0510 11/16/12 0838  WBC 3.7* 4.9 --  NEUTROABS -- -- 5.5  HGB 12.0 11.8* --  HCT 35.9* 36.0 --  MCV 91.3 93.3 --  PLT 179 180 --   Urinalysis:  Lab 11/16/12 1119  COLORURINE AMBER*    LABSPEC 1.024  PHURINE 5.5  GLUCOSEU NEGATIVE  HGBUR NEGATIVE  BILIRUBINUR SMALL*  KETONESUR NEGATIVE  PROTEINUR NEGATIVE  UROBILINOGEN 1.0  NITRITE NEGATIVE  LEUKOCYTESUR SMALL*   Studies/Results: US Abdomen Complete  11/16/2012  *RADIOLOGY REPORT*  Clinical Data:  Rule out cholecystitis.  Gallstones.  ABDOMINAL ULTRASOUND COMPLETE  Comparison:  07/05/2012  Findings:  Gallbladder:  Stable multiple gallstones.  No wall thickening, pericholecystic fluid, sonographic Murphy's sign.  Common Bile Duct:  Markedly dilated measuring up to 11 mm.  Liver: No focal mass lesion identified.  Within normal limits in parenchymal echogenicity.  IVC:  Appears normal.  Pancreas:  Grossly within normal limits.  No obvious mass or calculus.  Spleen:  Within normal limits in size and echotexture.  Right kidney:  Normal in size and parenchymal echogenicity.  No evidence of mass or hydronephrosis.  Left kidney:  Normal in size and parenchymal echogenicity.  No evidence of mass or hydronephrosis.  Abdominal Aorta:  No aneurysm identified.  IMPRESSION: Stable cholelithiasis.  No evidence of acute cholecystitis.  Common bile duct is markedly dilated.  Biliary obstruction is not excluded.  Further imaging of the pancreas with MRI or CT is recommended.  Original Report Authenticated By: Jolaine Click, M.D.    Medications: I have reviewed the patient's current medications. Scheduled Meds:    . antiseptic oral rinse  15 mL Mouth Rinse q12n4p  . chlorhexidine  15 mL Mouth Rinse BID  . enoxaparin (LOVENOX) injection  40 mg Subcutaneous Q24H  . metoprolol succinate  50 mg Oral Daily  . silver sulfADIAZINE   Topical BID  . triamterene-hydrochlorothiazide  1 each Oral Daily   Continuous Infusions:    . sodium chloride 125 mL/hr at 11/17/12 1049   PRN Meds:.acetaminophen, acetaminophen, HYDROcodone-acetaminophen, ondansetron (ZOFRAN) IV, ondansetron  Assessment/Plan: [name] is a 62 yo female with past medical  history significant for hypertension and cholethiasis presenting with abdominal pain and nausea, found to have gallstone pancreatitis and dilated common bile duct.   1) Gallstone pancreatitis No longer symptomatic, continued downtrend of lipase and transaminases. Tolerated clear diet well. Patient evaluated by surgery and GI, will undergo cholecystectomy Thurs or Friday with intraoperative cholangiogram. ERCP to be performed in the OR if indicated by cholangiogram.  - Advance to regular diet as tolerated today - Patient will need to be placed npo at [facility] if surgery planned for tomorrow.   2) Hypokalemia Potassium 3.1 on admission, 3.2 today. Has refused supplemental potassium in the past, including yesterday.  - Will replete orally x1.  3) Hypertension Patient on atenolol and maxide at home. Atenolol was held on admission as HR was in the 50s. Metoprolol substituted for atenolol yesterday. Still with some high BPs.  - Will titrate up dose metoprolol today. -Continue maxide  DVT prophy -  lovenox  Dispo: Disposition is deferred at this time, awaiting improvement of current medical problems.  Anticipated discharge in approximately 2-3 day(s).   The patient does have a current PCP (Pcp Not In System), therefore will not be requiring OPC follow-up after discharge.   The patient does not have transportation limitations that hinder transportation to clinic appointments.  .Services Needed at time of discharge: Y = Yes, Blank = No PT:   OT:   RN:   Equipment:   Other:     LOS: 2 days   Bronson Curb 11/18/2012, 8:12 AM

## 2012-11-18 NOTE — Progress Notes (Signed)
Internal Medicine Teaching Service Attending Note Date: 11/18/2012  Patient name: Jennifer [name]  Medical record number: [id number]  Date of birth: [birth year]    This patient has been seen and discussed with the house staff. Please see their note for complete details. I concur with their findings.  MULLEN, EMILY 11/18/2012, 10:07 AM

## 2012-11-18 NOTE — Progress Notes (Signed)
  Subjective: Feeling, better, no pain,   Objective: Vital signs in last 24 hours: Temp:  [98.4 F (36.9 C)-99.3 F (37.4 C)] 98.4 F (36.9 C) (01/01 0525) Pulse Rate:  [61-72] 61  (01/01 0525) Resp:  [16-20] 16  (01/01 0525) BP: (120-159)/(44-66) 159/51 mmHg (01/01 0525) SpO2:  [98 %-100 %] 99 % (01/01 0525) Last BM Date: 11/15/12  Afebrile, VSS, labs are better,   Intake/Output from previous day: 12/31 0701 - 01/01 0700 In: 1375 [I.V.:1375] Out: -  Intake/Output this shift:    General appearance: alert, cooperative and no distress GI: soft, non-tender; bowel sounds normal; no masses,  no organomegaly  Lab Results:   [facility] 11/18/12 0625 11/17/12 0510  WBC 3.7* 4.9  HGB 12.0 11.8*  HCT 35.9* 36.0  PLT 179 180    BMET  Basename 11/18/12 0625 11/17/12 0510  NA 141 136  K 3.2* 3.3*  CL 102 100  CO2 30 26  GLUCOSE 98 64*  BUN 9 12  CREATININE 0.66 0.65  CALCIUM 9.2 8.7   PT/INR No results found for this basename: LABPROT:2,INR:2 in the last 72 hours   Lab 11/18/12 0625 11/17/12 0510 11/16/12 0838  AST 74* 164* 441*  ALT 185* 266* 472*  ALKPHOS 261* 289* 380*  BILITOT 0.6 0.6 2.1*  PROT 7.1 6.6 7.7  ALBUMIN 3.2* 3.1* 3.5     Lipase     Component Value Date/Time   LIPASE 60* 11/18/2012 0625     Studies/Results: No results found.  Medications:    . antiseptic oral rinse  15 mL Mouth Rinse q12n4p  . chlorhexidine  15 mL Mouth Rinse BID  . enoxaparin (LOVENOX) injection  40 mg Subcutaneous Q24H  . metoprolol succinate  100 mg Oral Daily  . potassium chloride SA  40 mEq Oral Once  . silver sulfADIAZINE   Topical BID  . triamterene-hydrochlorothiazide  1 each Oral Daily    Assessment/Plan 1. Gallstone pancreatitis resolving.  2. Hypertension  3. Recent fracture left tib-fib with eschar debridement 09/20/2012.  4. Body mass index is 33.3 ongoing weight loss through exercise and diet   Plan:  Recheck labs in AM, npo after MN possible  surgery tomorrow. 40 meq KCL ordered po for today,   LOS: 2 days    Moani Weipert 11/18/2012

## 2012-11-18 NOTE — Progress Notes (Signed)
  Subjective: Comfortable Tolerating liquids  Objective: Vital signs in last 24 hours: Temp:  [98.4 F (36.9 C)-99.3 F (37.4 C)] 98.4 F (36.9 C) (01/01 0525) Pulse Rate:  [61-72] 61  (01/01 0525) Resp:  [16-20] 16  (01/01 0525) BP: (120-159)/(44-66) 159/51 mmHg (01/01 0525) SpO2:  [98 %-100 %] 99 % (01/01 0525) Last BM Date: 11/15/12  Intake/Output from previous day: 12/31 0701 - 01/01 0700 In: 1375 [I.V.:1375] Out: -  Intake/Output this shift:    Abdomen soft, minimal tenderness with no guarding  Lab Results:   Basename 11/18/12 0625 11/17/12 0510  WBC 3.7* 4.9  HGB 12.0 11.8*  HCT 35.9* 36.0  PLT 179 180   BMET  Basename 11/18/12 0625 11/17/12 0510  NA 141 136  K 3.2* 3.3*  CL 102 100  CO2 30 26  GLUCOSE 98 64*  BUN 9 12  CREATININE 0.66 0.65  CALCIUM 9.2 8.7   PT/INR No results found for this basename: LABPROT:2,INR:2 in the last 72 hours ABG No results found for this basename: PHART:2,PCO2:2,PO2:2,HCO3:2 in the last 72 hours  Studies/Results: No results found.  Anti-infectives: Anti-infectives    None      Assessment/Plan: s/p * No surgery found *  Labs improving Hopefully lap chole tomorrow  LOS: 2 days    Norlan Rann A 11/18/2012

## 2012-11-19 ENCOUNTER — Inpatient Hospital Stay (HOSPITAL_COMMUNITY): Payer: Self-pay | Admitting: Certified Registered Nurse Anesthetist

## 2012-11-19 ENCOUNTER — Encounter (HOSPITAL_COMMUNITY)
Admission: EM | Disposition: A | Discharge status: Home / Self Care | Payer: Self-pay | Source: Home / Self Care | Attending: Internal Medicine

## 2012-11-19 ENCOUNTER — Encounter (HOSPITAL_COMMUNITY): Payer: Self-pay | Admitting: Certified Registered Nurse Anesthetist

## 2012-11-19 ENCOUNTER — Inpatient Hospital Stay (HOSPITAL_COMMUNITY): Payer: Self-pay

## 2012-11-19 HISTORY — PX: CHOLECYSTECTOMY: SHX55

## 2012-11-19 LAB — CBC WITH DIFFERENTIAL/PLATELET
Basophils Absolute: 0 10*3/uL (ref 0.0–0.1)
Basophils Relative: 0 % (ref 0–1)
HCT: 36.9 % (ref 36.0–46.0)
Hemoglobin: 12.1 g/dL (ref 12.0–15.0)
Lymphocytes Relative: 30 % (ref 12–46)
MCHC: 32.8 g/dL (ref 30.0–36.0)
Monocytes Absolute: 0.6 10*3/uL (ref 0.1–1.0)
Neutro Abs: 2 10*3/uL (ref 1.7–7.7)
Neutrophils Relative %: 51 % (ref 43–77)
RDW: 14.3 % (ref 11.5–15.5)
WBC: 3.9 10*3/uL — ABNORMAL LOW (ref 4.0–10.5)

## 2012-11-19 LAB — COMPREHENSIVE METABOLIC PANEL
ALT: 144 U/L — ABNORMAL HIGH (ref 0–35)
Alkaline Phosphatase: 240 U/L — ABNORMAL HIGH (ref 39–117)
BUN: 11 mg/dL (ref 6–23)
CO2: 29 mEq/L (ref 19–32)
Calcium: 9.3 mg/dL (ref 8.4–10.5)
GFR calc Af Amer: >90 mL/min (ref >90)
GFR calc non Af Amer: >90 mL/min (ref >90)
Glucose, Bld: 100 mg/dL — ABNORMAL HIGH (ref 70–99)
Total Protein: 7.4 g/dL (ref 6.0–8.3)

## 2012-11-19 SURGERY — LAPAROSCOPIC CHOLECYSTECTOMY WITH INTRAOPERATIVE CHOLANGIOGRAM
Anesthesia: General | Wound class: Contaminated

## 2012-11-19 MED ORDER — PROPOFOL 10 MG/ML IV BOLUS
INTRAVENOUS | Status: DC | PRN
  Administered 2012-11-19: 90 mg via INTRAVENOUS

## 2012-11-19 MED ORDER — HYDROMORPHONE HCL PF 1 MG/ML IJ SOLN
0.2500 mg | INTRAMUSCULAR | Status: DC | PRN
  Administered 2012-11-19 (×2): 0.25 mg via INTRAVENOUS
  Administered 2012-11-19: 0.5 mg via INTRAVENOUS

## 2012-11-19 MED ORDER — ARTIFICIAL TEARS OP OINT
TOPICAL_OINTMENT | OPHTHALMIC | Status: DC | PRN
  Administered 2012-11-19: 1 via OPHTHALMIC

## 2012-11-19 MED ORDER — MIDAZOLAM HCL 5 MG/5ML IJ SOLN
INTRAMUSCULAR | Status: DC | PRN
  Administered 2012-11-19: 2 mg via INTRAVENOUS

## 2012-11-19 MED ORDER — SODIUM CHLORIDE 0.9 % IV SOLN
INTRAVENOUS | Status: DC | PRN
  Administered 2012-11-19: 11:00:00

## 2012-11-19 MED ORDER — MEPERIDINE HCL 25 MG/ML IJ SOLN: 6.2500 mg | INTRAMUSCULAR | Status: DC | PRN

## 2012-11-19 MED ORDER — 0.9 % SODIUM CHLORIDE (POUR BTL) OPTIME
TOPICAL | Status: DC | PRN
  Administered 2012-11-19: 1000 mL

## 2012-11-19 MED ORDER — LACTATED RINGERS IV SOLN
INTRAVENOUS | Status: DC | PRN
  Administered 2012-11-19: 10:00:00 via INTRAVENOUS

## 2012-11-19 MED ORDER — GLYCOPYRROLATE 0.2 MG/ML IJ SOLN
INTRAMUSCULAR | Status: DC | PRN
  Administered 2012-11-19: 0.6 mg via INTRAVENOUS
  Administered 2012-11-19: 0.2 mg via INTRAVENOUS

## 2012-11-19 MED ORDER — ROCURONIUM BROMIDE 100 MG/10ML IV SOLN
INTRAVENOUS | Status: DC | PRN
  Administered 2012-11-19: 10 mg via INTRAVENOUS
  Administered 2012-11-19: 40 mg via INTRAVENOUS

## 2012-11-19 MED ORDER — SODIUM CHLORIDE 0.9 % IR SOLN
Status: DC | PRN
  Administered 2012-11-19: 1000 mL

## 2012-11-19 MED ORDER — HYDROMORPHONE HCL PF 1 MG/ML IJ SOLN
INTRAMUSCULAR | Status: AC
  Filled 2012-11-19: qty 1

## 2012-11-19 MED ORDER — CEFAZOLIN SODIUM-DEXTROSE 2-3 GM-% IV SOLR
2.0000 g | INTRAVENOUS | Status: AC
  Administered 2012-11-20: 2 g via INTRAVENOUS
  Filled 2012-11-19: qty 50

## 2012-11-19 MED ORDER — OXYCODONE HCL 5 MG/5ML PO SOLN: 5.0000 mg | Freq: Once | ORAL | Status: DC | PRN

## 2012-11-19 MED ORDER — BUPIVACAINE-EPINEPHRINE 0.25% -1:200000 IJ SOLN
INTRAMUSCULAR | Status: DC | PRN
  Administered 2012-11-19: 20 mL

## 2012-11-19 MED ORDER — NEOSTIGMINE METHYLSULFATE 1 MG/ML IJ SOLN
INTRAMUSCULAR | Status: DC | PRN
  Administered 2012-11-19: 4 mg via INTRAVENOUS

## 2012-11-19 MED ORDER — LIDOCAINE HCL 4 % MT SOLN
OROMUCOSAL | Status: DC | PRN
  Administered 2012-11-19: 4 mL via TOPICAL

## 2012-11-19 MED ORDER — PROMETHAZINE HCL 25 MG/ML IJ SOLN: 6.2500 mg | INTRAMUSCULAR | Status: DC | PRN

## 2012-11-19 MED ORDER — POTASSIUM CHLORIDE 10 MEQ/100ML IV SOLN
10.0000 meq | INTRAVENOUS | Status: AC
  Administered 2012-11-19 (×2): 10 meq via INTRAVENOUS
  Filled 2012-11-19 (×2): qty 100

## 2012-11-19 MED ORDER — FENTANYL CITRATE 0.05 MG/ML IJ SOLN
INTRAMUSCULAR | Status: DC | PRN
  Administered 2012-11-19 (×2): 50 ug via INTRAVENOUS
  Administered 2012-11-19 (×2): 25 ug via INTRAVENOUS
  Administered 2012-11-19 (×2): 50 ug via INTRAVENOUS
  Administered 2012-11-19: 100 ug via INTRAVENOUS

## 2012-11-19 MED ORDER — LIDOCAINE HCL (CARDIAC) 20 MG/ML IV SOLN
INTRAVENOUS | Status: DC | PRN
  Administered 2012-11-19: 25 mg via INTRAVENOUS

## 2012-11-19 MED ORDER — ONDANSETRON HCL 4 MG/2ML IJ SOLN
INTRAMUSCULAR | Status: DC | PRN
  Administered 2012-11-19: 4 mg via INTRAVENOUS

## 2012-11-19 MED ORDER — KCL IN DEXTROSE-NACL 30-5-0.45 MEQ/L-%-% IV SOLN
INTRAVENOUS | Status: DC
  Administered 2012-11-19: 1000 mL via INTRAVENOUS
  Filled 2012-11-19 (×3): qty 1000

## 2012-11-19 MED ORDER — OXYCODONE HCL 5 MG PO TABS: 5.0000 mg | ORAL_TABLET | Freq: Once | ORAL | Status: DC | PRN

## 2012-11-19 SURGICAL SUPPLY — 50 items
APL SKNCLS STERI-STRIP NONHPOA (GAUZE/BANDAGES/DRESSINGS) ×1
APPLIER CLIP ROT 10 11.4 M/L (STAPLE) ×4
APR CLP MED LRG 11.4X10 (STAPLE) ×2
BAG SPEC RTRVL LRG 6X4 10 (ENDOMECHANICALS) ×1
BENZOIN TINCTURE PRP APPL 2/3 (GAUZE/BANDAGES/DRESSINGS) ×2 IMPLANT
CANISTER SUCTION 2500CC (MISCELLANEOUS) ×2 IMPLANT
CHLORAPREP W/TINT 26ML (MISCELLANEOUS) ×2 IMPLANT
CLIP APPLIE ROT 10 11.4 M/L (STAPLE) ×1 IMPLANT
CLOTH BEACON ORANGE TIMEOUT ST (SAFETY) ×3 IMPLANT
COVER MAYO STAND STRL (DRAPES) ×2 IMPLANT
COVER SURGICAL LIGHT HANDLE (MISCELLANEOUS) ×3 IMPLANT
DECANTER SPIKE VIAL GLASS SM (MISCELLANEOUS) ×2 IMPLANT
DRAPE C-ARM 42X72 X-RAY (DRAPES) ×2 IMPLANT
DRAPE UTILITY 15X26 W/TAPE STR (DRAPE) ×6 IMPLANT
ELECT REM PT RETURN 9FT ADLT (ELECTROSURGICAL) ×2
ELECTRODE REM PT RTRN 9FT ADLT (ELECTROSURGICAL) ×1 IMPLANT
GAUZE SPONGE 2X2 8PLY STRL LF (GAUZE/BANDAGES/DRESSINGS) ×1 IMPLANT
GLOVE BIO SURGEON STRL SZ7 (GLOVE) ×2 IMPLANT
GLOVE BIO SURGEON STRL SZ7.5 (GLOVE) ×1 IMPLANT
GLOVE BIOGEL PI IND STRL 7.0 (GLOVE) IMPLANT
GLOVE BIOGEL PI IND STRL 7.5 (GLOVE) IMPLANT
GLOVE BIOGEL PI INDICATOR 7.0 (GLOVE) ×1
GLOVE BIOGEL PI INDICATOR 7.5 (GLOVE) ×1
GLOVE SURG ORTHO 8.0 STRL STRW (GLOVE) ×2 IMPLANT
GLOVE SURG SS PI 6.5 STRL IVOR (GLOVE) ×1 IMPLANT
GLOVE SURG SS PI 7.0 STRL IVOR (GLOVE) ×1 IMPLANT
GOWN STRL NON-REIN LRG LVL3 (GOWN DISPOSABLE) ×6 IMPLANT
GOWN STRL REIN XL XLG (GOWN DISPOSABLE) ×3 IMPLANT
KIT BASIN OR (CUSTOM PROCEDURE TRAY) ×2 IMPLANT
KIT ROOM TURNOVER OR (KITS) ×2 IMPLANT
NS IRRIG 1000ML POUR BTL (IV SOLUTION) ×2 IMPLANT
PAD ARMBOARD 7.5X6 YLW CONV (MISCELLANEOUS) ×4 IMPLANT
POUCH SPECIMEN RETRIEVAL 10MM (ENDOMECHANICALS) ×1 IMPLANT
SCISSORS LAP 5X35 DISP (ENDOMECHANICALS) IMPLANT
SET CHOLANGIOGRAPH 5 50 .035 (SET/KITS/TRAYS/PACK) ×2 IMPLANT
SET IRRIG TUBING LAPAROSCOPIC (IRRIGATION / IRRIGATOR) ×3 IMPLANT
SLEEVE Z-THREAD 5X100MM (TROCAR) ×2 IMPLANT
SPECIMEN JAR SMALL (MISCELLANEOUS) ×2 IMPLANT
SPONGE GAUZE 2X2 STER 10/PKG (GAUZE/BANDAGES/DRESSINGS) ×1
STOPCOCK 4 WAY LG BORE MALE ST (IV SETS) ×1 IMPLANT
STRIP CLOSURE SKIN 1/2X4 (GAUZE/BANDAGES/DRESSINGS) ×1 IMPLANT
SUT MNCRL AB 4-0 PS2 18 (SUTURE) ×2 IMPLANT
TAPE CLOTH SURG 4X10 WHT LF (GAUZE/BANDAGES/DRESSINGS) ×1 IMPLANT
TOWEL OR 17X24 6PK STRL BLUE (TOWEL DISPOSABLE) ×2 IMPLANT
TOWEL OR 17X26 10 PK STRL BLUE (TOWEL DISPOSABLE) ×3 IMPLANT
TRAY LAPAROSCOPIC (CUSTOM PROCEDURE TRAY) ×3 IMPLANT
TROCAR XCEL BLUNT TIP 100MML (ENDOMECHANICALS) ×3 IMPLANT
TROCAR Z-THREAD FIOS 11X100 BL (TROCAR) ×2 IMPLANT
TROCAR Z-THREAD FIOS 5X100MM (TROCAR) ×2 IMPLANT
WATER STERILE IRR 1000ML POUR (IV SOLUTION) IMPLANT

## 2012-11-19 NOTE — Progress Notes (Signed)
  Subjective: Pt feeling great, no pain/nausea.  Ambulating well, tolerating diet, urinating well and +BM/flatus.    Objective: Vital signs in last 24 hours: Temp:  [98.2 F (36.8 C)-98.6 F (37 C)] 98.2 F (36.8 C) (01/02 0535) Pulse Rate:  [62-72] 72  (01/02 0535) Resp:  [16-18] 18  (01/02 0535) BP: (124-126)/(49-57) 126/49 mmHg (01/02 0535) SpO2:  [98 %-99 %] 99 % (01/02 0535) Last BM Date: 11/18/12  Intake/Output from previous day: 01/01 0701 - 01/02 0700 In: 627 [I.V.:627] Out: -  Intake/Output this shift:    PE: Gen:  Alert, NAD, pleasant Card:  RRR, no M/G/R heard Pulm:  CTA, no W/R/R Abd: Soft, NT/ND, +BS, no HSM   Lab Results:   Basename 11/19/12 0630 11/18/12 0625  WBC 3.9* 3.7*  HGB 12.1 12.0  HCT 36.9 35.9*  PLT 200 179   BMET  Basename 11/18/12 0625 11/17/12 0510  NA 141 136  K 3.2* 3.3*  CL 102 100  CO2 30 26  GLUCOSE 98 64*  BUN 9 12  CREATININE 0.66 0.65  CALCIUM 9.2 8.7   PT/INR No results found for this basename: LABPROT:2,INR:2 in the last 72 hours CMP     Component Value Date/Time   NA 141 11/18/2012 0625   K 3.2* 11/18/2012 0625   CL 102 11/18/2012 0625   CO2 30 11/18/2012 0625   GLUCOSE 98 11/18/2012 0625   BUN 9 11/18/2012 0625   CREATININE 0.66 11/18/2012 0625   CALCIUM 9.2 11/18/2012 0625   PROT 7.1 11/18/2012 0625   ALBUMIN 3.2* 11/18/2012 0625   AST 74* 11/18/2012 0625   ALT 185* 11/18/2012 0625   ALKPHOS 261* 11/18/2012 0625   BILITOT 0.6 11/18/2012 0625   GFRNONAA >90 11/18/2012 0625   GFRAA >90 11/18/2012 0625   Lipase     Component Value Date/Time   LIPASE 60* 11/18/2012 0625       Studies/Results: No results found.  Anti-infectives: Anti-infectives     Start     Dose/Rate Route Frequency Ordered Stop   11/19/12 0600   ciprofloxacin (CIPRO) IVPB 400 mg        400 mg 200 mL/hr over 60 Minutes Intravenous On call to O.R. 11/18/12 1035 11/20/12 0559           Assessment/Plan 1. Gallstone pancreatitis resolving-no pain, LFT's  still elevated but improved 2. Hypertension  3. Recent fracture left tib-fib with eschar debridement 09/20/2012.  4. Body mass index is 33.3 ongoing weight loss through exercise and diet   Plan:  1.  Cont. NPO for anticipated laparoscopic cholecystectomy with IOC and possible ERCP if needed 2.  Pt will likely go home tomorrow if surgery goes well 3.  Repeat CMET pending     LOS: 3 days    DORT, Aundra Millet 11/19/2012, 7:46 AM Pager: [phone]

## 2012-11-19 NOTE — Anesthesia Preprocedure Evaluation (Addendum)
Anesthesia Evaluation  Patient identified by MRN, date of birth, ID band Patient awake    Reviewed: Allergy & Precautions, H&P , NPO status , Patient's Chart, lab work & pertinent test results  History of Anesthesia Complications (+) AWARENESS UNDER ANESTHESIA  Airway Mallampati: I TM Distance: >3 FB Neck ROM: full    Dental  (+) Poor Dentition, Chipped and Dental Advisory Given,    Pulmonary  breath sounds clear to auscultation        Cardiovascular hypertension, Rhythm:regular Rate:Normal     Neuro/Psych    GI/Hepatic cholelethiasis   Endo/Other    Renal/GU      Musculoskeletal   Abdominal   Peds  Hematology   Anesthesia Other Findings   Reproductive/Obstetrics                          Anesthesia Physical Anesthesia Plan  ASA: II  Anesthesia Plan: General ETT   Post-op Pain Management:    Induction:   Airway Management Planned:   Additional Equipment:   Intra-op Plan:   Post-operative Plan:   Informed Consent: I have reviewed the patients History and Physical, chart, labs and discussed the procedure including the risks, benefits and alternatives for the proposed anesthesia with the patient or authorized representative who has indicated his/her understanding and acceptance.   Dental Advisory Given  Plan Discussed with: CRNA and Surgeon  Anesthesia Plan Comments:        Anesthesia Quick Evaluation

## 2012-11-19 NOTE — Transfer of Care (Signed)
Immediate Anesthesia Transfer of Care Note  [name]  Procedure(s) Performed: Procedure(s) (LRB) with comments: LAPAROSCOPIC CHOLECYSTECTOMY WITH INTRAOPERATIVE CHOLANGIOGRAM (N/A)  Patient Location: PACU  Anesthesia Type:General  Level of Consciousness: awake, alert  and oriented  Airway & Oxygen Therapy: Patient Spontanous Breathing and Patient connected to face mask oxygen  Post-op Assessment: Report given to PACU RN, Post -op Vital signs reviewed and stable and Patient moving all extremities  Post vital signs: Reviewed and stable  Complications: No apparent anesthesia complications

## 2012-11-19 NOTE — Progress Notes (Signed)
Internal Medicine Teaching Service Attending Note Date: 11/19/2012  Patient name: Jennifer [name]  Medical record number: [id number]  Date of birth: [birth year]    This patient has been seen and discussed with the house staff. Please see their note for complete details. I concur with their findings.   Veasna Santibanez 11/19/2012, 2:33 PM

## 2012-11-19 NOTE — Anesthesia Postprocedure Evaluation (Signed)
Anesthesia Post Note  [name]  Procedure(s) Performed: Procedure(s) (LRB): LAPAROSCOPIC CHOLECYSTECTOMY WITH INTRAOPERATIVE CHOLANGIOGRAM (N/A)  Anesthesia type: general  Patient location: PACU  Post pain: Pain level controlled  Post assessment: Patient's Cardiovascular Status Stable  Last Vitals:  Filed Vitals:   11/19/12 1200  BP: 146/69  Pulse: 73  Temp:   Resp: 16    Post vital signs: Reviewed and stable  Level of consciousness: sedated  Complications: No apparent anesthesia complications

## 2012-11-19 NOTE — Discharge Summary (Signed)
Internal Medicine Teaching [facility] Discharge Note  Name: Jennifer [name] MRN: [id number] DOB: 10-15-1951 62 y.o.  Date of Admission: 11/16/2012  6:29 AM Date of Discharge: 11/20/2012 Attending Physician: Inez Catalina, MD  Discharge Diagnosis: Gallstone pancreatitis with biliary obstruction Hypertension Hypokalemia  Discharge Medications:   Medication List     As of 11/20/2012 10:44 AM    STOP taking these medications         atenolol 50 MG tablet   Commonly known as: TENORMIN      OVER THE COUNTER MEDICATION      TAKE these medications         HYDROcodone-acetaminophen 10-325 MG per tablet   Commonly known as: NORCO   Take 1 tablet by mouth every 4 (four) hours as needed.      HYDROcodone-acetaminophen 5-325 MG per tablet   Commonly known as: NORCO/VICODIN   Take 1-2 tablets by mouth every 6 (six) hours as needed.      metoprolol succinate 100 MG 24 hr tablet   Commonly known as: TOPROL-XL   Take 1 tablet (100 mg total) by mouth daily. Take with or immediately following a meal.      triamterene-hydrochlorothiazide 37.5-25 MG per tablet   Commonly known as: MAXZIDE-25   Take 1 tablet by mouth daily.          Disposition and follow-up:   [name] was discharged from [facility] in good condition.  At the hospital follow up visit please address  1) S/p cholecystectomy  Patient p/w gallstone pancreatitis and biliary obstruction from stone. Please check LFTs for continued improvement in liver function after lap chole. Please evaluate laprascopic incisions sites for any evidence of infection.  2) Hypertension Previously on maxide and atenolol at home. Atenolol discontinued on admission due to low HR (50s) and metoprolol substituted. BP stable on metoprolol and maxide while inpatient. Please evaluate blood pressure as outpatient and assess need for uptitration of any meds.   Follow-up Appointments: Follow-up Information    Follow  up with Christen Bame, MD. On 11/26/2012. (at 2:45)    Contact information:   [facility] [address] [place] [address] [phone]       Schedule an appointment as soon as possible for a visit with Lillia Carmel, MD.   Contact information:   [address] [place] [address] [phone]       Follow up with [facility]. Schedule an appointment as soon as possible for a visit in 2 weeks. (The office will call you with an appointment for 2-3 weeks from now)    Contact information:   [address]   [place] [address] [phone]         Discharge Orders    Future Appointments: Provider: Department: Dept Phone: Center:   11/26/2012 2:45 PM Christen Bame, MD [facility] [phone] [facility]   12/01/2012 2:15 PM [facility] [place] [phone] None     Future Orders Please Complete By Expires   Diet - low sodium heart healthy      Increase activity slowly      Discharge instructions      Comments:   Please follow up with your appointments listed below.   Call MD for:  extreme fatigue      Call MD for:  persistant dizziness or light-headedness  Call MD for:  difficulty breathing, headache or visual disturbances      Call MD for:  redness, tenderness, or signs of infection (pain, swelling, redness, odor or green/yellow discharge around incision site)      Call MD for:  severe uncontrolled pain      Call MD for:  persistant nausea and vomiting      Call MD for:  temperature >100.4         Consultations: Treatment Team:  Md Montez Morita, MD [facility]  Procedures Performed:  US Abdomen Complete  11/16/2012  *RADIOLOGY REPORT*  Clinical Data:  Rule out cholecystitis.  Gallstones.  ABDOMINAL ULTRASOUND COMPLETE  Comparison:  07/05/2012  Findings:  Gallbladder:  Stable multiple gallstones.  No wall thickening, pericholecystic fluid, sonographic Murphy's sign.   Common Bile Duct:  Markedly dilated measuring up to 11 mm.  Liver: No focal mass lesion identified.  Within normal limits in parenchymal echogenicity.  IVC:  Appears normal.  Pancreas:  Grossly within normal limits.  No obvious mass or calculus.  Spleen:  Within normal limits in size and echotexture.  Right kidney:  Normal in size and parenchymal echogenicity.  No evidence of mass or hydronephrosis.  Left kidney:  Normal in size and parenchymal echogenicity.  No evidence of mass or hydronephrosis.  Abdominal Aorta:  No aneurysm identified.  IMPRESSION: Stable cholelithiasis.  No evidence of acute cholecystitis.  Common bile duct is markedly dilated.  Biliary obstruction is not excluded.  Further imaging of the pancreas with MRI or CT is recommended.   Original Report Authenticated By: Jolaine Click, M.D.     Admission HPI: 62 year old woman with past medical history significant for hypertension, cholethiasis presents to the ER with chief complaint of abdominal pain and nausea.  Patient was in her usual state of health until yesterday afternoon when she started having abdominal pain around 2 PM. She states that her pain was getting progressively worse and she took pain pills around 4 PM and 9:30 PM ( vicodin given to her with her last ED visit) but that didn't help her much. She woke up around 5 AM with severe pain and took another pill but that didn't help either and she decided to come to the ER for further evaluation. She describes her pain as sharp pain located in the epigastrium, 8/10 when its worse, radiating to the back with no aggravating or relieving factors. She also reports associated nausea but denies any vomiting. She also denies any fevers, chills, diarrhea or constipation, or decrease in appetite.  She presented with a similar complaint about 4-5 months ago ( but no this severe) when she was told that she has gallstones and should go for an elective cholecystitis whenever she wants.   Hospital  Course by problem list: 1) Gallstone pancreatitis  Patient presented with mild nausea, moderate epigastric pain radiating to her back, lipase >3000 and transaminitis. Abdominal ultrasound demonstrated gallstones and dilated CBD (11mm) without evidence of cholecystitis. She was initially treated conservatively with IVF hydration and pain meds. On hospital day 2, lipase had decreased to 180 and transaminases downtrending. Pain was minimal. Over the next couple of days, her liver function continued to improve and her diet was advanced. She was evaluated by GI and generally surgery. The decision was made to proceed with laparascopic cholecystectomy with intraoperative cholangiogram on hospital day 4 (11/19/12) by Dr. Gerrit Friends. The procedure went without complications, and intraoperative cholangiogram was negative for retained CBD stones. On day of discharge, incision sites  were without erythema, warmth, pain, induration or discharge. Instructed on postop wound care, will have hospital follow up with IM and surgery next week.     2) Hypokalemia  Baseline potassium 3.1-3.3. Has refused supplementation in the past. Agreed to oral supplementation once and IV supplement on day of surgery. On day of discharge K was 3.9.   3) Hypertension  Patient on atenolol and maxide at home. Atenolol was held on admission as HR was in the 50s. Metoprolol substituted for atenolol in setting of bradycardia. Normotensive BPs on metoprolol and maxide. Will discharge on metoprolol and maxide.   Discharge Vitals:  BP 122/68  Pulse 54  Temp 97.8 F (36.6 C) (Oral)  Resp 18  Ht 4\' 11"  (1.499 m)  Wt 165 lb (74.844 kg)  BMI 33.33 kg/m2  SpO2 98%  Discharge Day Physical Exam: General: sitting up at table doing puzzle.  HEENT: PERRL, EOMI, no scleral icterus  Cardiac: RRR, no rubs, murmurs or gallops  Pulm: clear to auscultation bilaterally, no wheezes, rales, or rhonchi  Abd: Lap incision sites are bandaged, no erythema, pain,  induration or drainage. +BS  Ext: L upper thigh wound examined, no erythema/warmth/induration or drainage. Warm and well perfused extremities.  Neuro: alert and oriented X3, cranial nerves II-XII grossly intact, strength and sensation to light touch equal in bilateral upper and lower extremities   Discharge Labs:  Results for orders placed during the hospital encounter of 11/16/12 (from the past 24 hour(s))  COMPREHENSIVE METABOLIC PANEL     Status: Abnormal   Collection Time   11/20/12  5:55 AM      Component Value Range   Sodium 142  135 - 145 mEq/L   Potassium 3.9  3.5 - 5.1 mEq/L   Chloride 105  96 - 112 mEq/L   CO2 27  19 - 32 mEq/L   Glucose, Bld 94  70 - 99 mg/dL   BUN 12  6 - 23 mg/dL   Creatinine, Ser 9.14  0.50 - 1.10 mg/dL   Calcium 8.7  8.4 - 78.2 mg/dL   Total Protein 6.1  6.0 - 8.3 g/dL   Albumin 2.7 (*) 3.5 - 5.2 g/dL   AST 51 (*) 0 - 37 U/L   ALT 106 (*) 0 - 35 U/L   Alkaline Phosphatase 181 (*) 39 - 117 U/L   Total Bilirubin 0.3  0.3 - 1.2 mg/dL   GFR calc non Af Amer 89 (*) >90 mL/min   GFR calc Af Amer >90  >90 mL/min    Signed: Bronson Curb 11/20/2012, 10:44 AM   Time Spent on Discharge: 35 min Services Ordered on Discharge: none Equipment Ordered on Discharge: none

## 2012-11-19 NOTE — Progress Notes (Signed)
General Surgery [facility]  Patient seen and examined.  LFT's improving.  Clinically doing very well.  No abdominal pain or tenderness on exam.  Will proceed with lap chole and IOC today.  The risks and benefits of the procedure have been discussed at length with the patient.  The patient understands the proposed procedure, potential alternative treatments, and the course of recovery to be expected.  All of the patient's questions have been answered at this time.  The patient wishes to proceed with surgery.  Velora Heckler, MD, [facility] Office: [phone]

## 2012-11-19 NOTE — Op Note (Signed)
Procedure Note  Pre-operative Diagnosis: Calculus of gallbladder with other cholecystitis and obstruction  Post-operative Diagnosis: Calculus of gallbladder with other cholecystitis, without mention of obstruction  Surgeon:  Velora Heckler, MD, FACS  Procedure:  Laparoscopic cholecystectomy with intra-operative cholangiography  Assistant:  Magnus Ivan, RNFA   Anesthesia:  General  Indications: This patient presents with symptomatic gallbladder disease and will undergo laparoscopic cholecystectomy with intraoperative cholangiography.  Procedure Details: The patient was seen in the pre-op holding area. The risks, benefits, complications, treatment options, and expected outcomes have been discussed with the patient. The patient and/or family agreed with the proposed plan and signed the informed consent form.  The patient was taken to Operating Room, identified as Jennifer [name] and the procedure verified as Laparoscopic Cholecystectomy with Intraoperative Cholangiogram. A "time out" was completed and the above information confirmed.  Prior to the induction of general anesthesia, antibiotic prophylaxis was administered. General endotracheal anesthesia was then administered and tolerated well. After the induction, the abdomen was prepped in the usual strict aseptic fashion. The patient was in the supine position.  An incision was made in the skin near the umbilicus. The midline fascia was incised and the peritoneal cavity entered and the Hasson canula was introduced under direct vision. Hasson canula was secured with a pursestring 0-Vicryl suture. Pneumoperitoneum was then established with carbon dioxide and tolerated well without any adverse changes in the patient's vital signs. Additional trocars were introduced under direct vision along the right costal margin in the midline, mid-clavicular line, and anterior axillary line.  The gallbladder was identified and the fundus grasped and  retracted cephalad. Adhesions were taken down bluntly and with the electrocautery as needed, taking care not to injure any adjacent structures. The infundibulum was grasped and retracted laterally, exposing the peritoneum overlying the triangle of Calot. This was incised and structures exposed in a blunt fashion. The cystic duct was clearly identified and bluntly dissected circumferentially and clipped at the neck of the gallbladder.  An incision was made in the cystic duct and the cholangiogram catheter introduced. The catheter was secured using an ligaclip.  Real-time cholangiography was performed using the C-arm.  There was rapid filling of a normal caliber common bile duct.  There was reflux of contrast into the left and right hepatic ductal systems.  There was free flow distally into the duodenum without filling defect or obstruction.  Catheter was removed from the peritoneal cavity.  The cystic duct was then triply ligated with surgical clips and divided. The cystic artery was identified, dissected circumferentially, ligated with ligaclips, and divided.   The gallbladder was dissected from the liver bed with the electrocautery used for hemostasis. The gallbladder was completely removed and placed into an endocatch bag. The right upper quadrant was irrigated and inspected. Hemostasis was achieved with the electrocautery. Warm saline irrigation was utilized and was repeatedly aspirated until clear.  Pneumoperitoneum was released after viewing removal of the trocars with good hemostasis noted. The umbilical wound was irrigated and the fascia was then closed with the pursestring suture.  The skin was then closed with 4-0 Monocril subcuticular sutures and sterile dressings were applied.  Instrument, sponge, and needle counts were correct at the conclusion of the case.  The patient tolerated the procedure well.  Estimated Blood Loss: Minimal         Drains: none         Specimens: Gallbladder to  pathology         Disposition: PACU - hemodynamically  stable.         Condition: stable   Velora Heckler, MD, [facility] Office: [phone]

## 2012-11-19 NOTE — Progress Notes (Signed)
Subjective: No complaints this morning. Tolerated full diet well yesterday. Lipase stable, transaminases continued downtrend. To OR today for lap chole w IOC.  Denies CP, SOB, N/V, dizziness, abdominal pain, diarrhea.   Objective: Vital signs in last 24 hours: Filed Vitals:   11/18/12 0525 11/18/12 1430 11/18/12 2155 11/19/12 0535  BP: 159/51 126/57 124/52 126/49  Pulse: 61 62 70 72  Temp: 98.4 F (36.9 C) 98.6 F (37 C) 98.6 F (37 C) 98.2 F (36.8 C)  TempSrc: Oral  Oral Oral  Resp: 16 16 18 18   Height:      Weight:      SpO2: 99% 99% 98% 99%   Weight change:   Intake/Output Summary (Last 24 hours) at 11/19/12 0837 Last data filed at 11/19/12 0529  Gross per 24 hour  Intake    627 ml  Output      0 ml  Net    627 ml   Vitals reviewed. General: lying in bed, friendly HEENT: PERRL, EOMI, no scleral icterus Cardiac: RRR, no rubs, murmurs or gallops Pulm: clear to auscultation bilaterally, no wheezes, rales, or rhonchi Abd: no tenderness to deep palpation of epigastrum Ext: L upper thigh prior surgical site bandaged, no erythema/warmth/induration or drainage. Warm and well perfused extremities.  Neuro: alert and oriented X3, cranial nerves II-XII grossly intact, strength and sensation to light touch equal in bilateral upper and lower extremities  Lab Results: Basic Metabolic Panel:  Lab 11/19/12 2440 11/18/12 0625 11/16/12 0838  NA 141 141 --  K 3.2* 3.2* --  CL 103 102 --  CO2 29 30 --  GLUCOSE 100* 98 --  BUN 11 9 --  CREATININE 0.66 0.66 --  CALCIUM 9.3 9.2 --  MG -- -- 2.1  PHOS -- -- --   Liver Function Tests:  Lab 11/19/12 0630 11/18/12 0625  AST 49* 74*  ALT 144* 185*  ALKPHOS 240* 261*  BILITOT 0.3 0.6  PROT 7.4 7.1  ALBUMIN 3.2* 3.2*    Lab 11/19/12 0630 11/18/12 0625  LIPASE 83* 60*  AMYLASE -- --   CBC:  Lab 11/19/12 0630 11/18/12 0625 11/16/12 0838  WBC 3.9* 3.7* --  NEUTROABS 2.0 -- 5.5  HGB 12.1 12.0 --  HCT 36.9 35.9* --  MCV  91.1 91.3 --  PLT 200 179 --   Urinalysis:  Lab 11/16/12 1119  COLORURINE AMBER*  LABSPEC 1.024  PHURINE 5.5  GLUCOSEU NEGATIVE  HGBUR NEGATIVE  BILIRUBINUR SMALL*  KETONESUR NEGATIVE  PROTEINUR NEGATIVE  UROBILINOGEN 1.0  NITRITE NEGATIVE  LEUKOCYTESUR SMALL*   Studies/Results: No results found. Medications: I have reviewed the patient's current medications.  Scheduled Meds:    . antiseptic oral rinse  15 mL Mouth Rinse q12n4p  . chlorhexidine  15 mL Mouth Rinse BID  . ciprofloxacin  400 mg Intravenous On Call to OR  . enoxaparin (LOVENOX) injection  40 mg Subcutaneous Q24H  . metoprolol succinate  100 mg Oral Daily  . silver sulfADIAZINE   Topical BID  . triamterene-hydrochlorothiazide  1 each Oral Daily   Continuous Infusions:    . sodium chloride 75 mL/hr at 11/18/12 2105   PRN Meds:.acetaminophen, acetaminophen, HYDROcodone-acetaminophen, ondansetron (ZOFRAN) IV, ondansetron  Assessment/Plan: [name] is a 62 yo female with past medical history significant for hypertension and cholethiasis presenting with abdominal pain and nausea, found to have gallstone pancreatitis and dilated common bile duct.   1) Gallstone pancreatitis No longer symptomatic, stable lipase and continued downtrend of transaminases. Has tolerated  full diet well. Suspect acute pancreatitis resolved. Plan for OR today for lap chole with IOC. Home ?today or tomorrow am.   - npo for surgery  2) Hypokalemia Baseline potassium 3.1-3.3. Has refused supplementation in the past. Agreed to oral supplementation yesterday.  - Will give IV this morning preop  3) Hypertension Patient on atenolol and maxide at home. Atenolol was held on admission as HR was in the 50s. Metoprolol substituted for atenolol in setting of bradycardia. Normotensive BPs.  - Continue metoprolol 100SR and maxide  DVT prophy -  lovenox  Dispo: Disposition is deferred at this time, awaiting improvement of current medical  problems.  Anticipated discharge in approximately tomorrow am.   The patient does have a current PCP (Pcp Not In System), therefore will not be requiring OPC follow-up after discharge.   The patient does not have transportation limitations that hinder transportation to clinic appointments.  .Services Needed at time of discharge: Y = Yes, Blank = No PT:   OT:   RN:   Equipment:   Other:     LOS: 3 days   Jennifer [name] 11/19/2012, 8:37 AM

## 2012-11-19 NOTE — Preoperative (Signed)
Beta Blockers   Reason not to administer Beta Blockers:Not Applicable 

## 2012-11-20 DIAGNOSIS — K8051 Calculus of bile duct without cholangitis or cholecystitis with obstruction: Secondary | ICD-10-CM

## 2012-11-20 LAB — COMPREHENSIVE METABOLIC PANEL
ALT: 106 U/L — ABNORMAL HIGH (ref 0–35)
AST: 51 U/L — ABNORMAL HIGH (ref 0–37)
Albumin: 2.7 g/dL — ABNORMAL LOW (ref 3.5–5.2)
CO2: 27 mEq/L (ref 19–32)
Chloride: 105 mEq/L (ref 96–112)
Creatinine, Ser: 0.76 mg/dL (ref 0.50–1.10)
Sodium: 142 mEq/L (ref 135–145)
Total Bilirubin: 0.3 mg/dL (ref 0.3–1.2)

## 2012-11-20 MED ORDER — HYDROCODONE-ACETAMINOPHEN 5-325 MG PO TABS: 1.0000 | ORAL_TABLET | Freq: Four times a day (QID) | ORAL | Status: DC | PRN

## 2012-11-20 MED ORDER — METOPROLOL SUCCINATE ER 100 MG PO TB24: 100.0000 mg | ORAL_TABLET | Freq: Every day | ORAL | Status: DC

## 2012-11-20 NOTE — Progress Notes (Signed)
General Surgery [facility]  Patient doing well post op.  For discharge today.  Velora Heckler, MD, [facility] Office: [phone]

## 2012-11-20 NOTE — Progress Notes (Signed)
1 Day Post-Op  Subjective: Pt feeling great today, very little pain, ambulating well, tolerating her diet and pain well controlled.  Pt ready to go home.  Objective: Vital signs in last 24 hours: Temp:  [97.2 F (36.2 C)-98.7 F (37.1 C)] 97.8 F (36.6 C) (01/03 0519) Pulse Rate:  [47-82] 54  (01/03 0519) Resp:  [11-35] 18  (01/03 0519) BP: (110-150)/(43-112) 122/68 mmHg (01/03 0519) SpO2:  [83 %-100 %] 98 % (01/03 0519) Last BM Date: 11/18/12  Intake/Output from previous day: 01/02 0701 - 01/03 0700 In: 3143.8 [P.O.:360; I.V.:2733.8; IV Piggyback:50] Out: 1250 [Urine:1200; Blood:50] Intake/Output this shift:    PE: Gen:  Alert, NAD, pleasant Abd: Soft, appropriate tenderness, ND, +BS, no HSM, incisions C/D/I   Lab Results:   Basename 11/19/12 0630 11/18/12 0625  WBC 3.9* 3.7*  HGB 12.1 12.0  HCT 36.9 35.9*  PLT 200 179   BMET  Basename 11/20/12 0555 11/19/12 0630  NA 142 141  K 3.9 3.2*  CL 105 103  CO2 27 29  GLUCOSE 94 100*  BUN 12 11  CREATININE 0.76 0.66  CALCIUM 8.7 9.3   PT/INR No results found for this basename: LABPROT:2,INR:2 in the last 72 hours CMP     Component Value Date/Time   NA 142 11/20/2012 0555   K 3.9 11/20/2012 0555   CL 105 11/20/2012 0555   CO2 27 11/20/2012 0555   GLUCOSE 94 11/20/2012 0555   BUN 12 11/20/2012 0555   CREATININE 0.76 11/20/2012 0555   CALCIUM 8.7 11/20/2012 0555   PROT 6.1 11/20/2012 0555   ALBUMIN 2.7* 11/20/2012 0555   AST 51* 11/20/2012 0555   ALT 106* 11/20/2012 0555   ALKPHOS 181* 11/20/2012 0555   BILITOT 0.3 11/20/2012 0555   GFRNONAA 89* 11/20/2012 0555   GFRAA >90 11/20/2012 0555   Lipase     Component Value Date/Time   LIPASE 83* 11/19/2012 0630       Studies/Results: Dg Cholangiogram Operative  11/19/2012  *RADIOLOGY REPORT*  Clinical Data:   Cholelithiasis, symptomatic.  INTRAOPERATIVE CHOLANGIOGRAM  Technique:  Cholangiographic images from the C-arm fluoroscopic device were submitted for interpretation  post-operatively.  Please see the procedural report for the amount of contrast and the fluoroscopy time utilized.  Comparison:  None.  Findings:  The gallbladder has been removed and the cystic duct cannulated.  There is good filling of the common bile duct, common hepatic duct, and intrahepatic ducts.  There is no visible filling defect.  There is prompt passage of contrast into the duodenum. Slight reflux into the pancreatic duct is noted.  IMPRESSION: Negative operative cholangiogram.   Original Report Authenticated By: Davonna Belling, M.D.     Anti-infectives: Anti-infectives     Start     Dose/Rate Route Frequency Ordered Stop   11/20/12 0600   ceFAZolin (ANCEF) IVPB 2 g/50 mL premix        2 g 100 mL/hr over 30 Minutes Intravenous On call to O.R. 11/19/12 1453 11/20/12 0548   11/19/12 0600   ciprofloxacin (CIPRO) IVPB 400 mg        400 mg 200 mL/hr over 60 Minutes Intravenous On call to O.R. 11/18/12 1035 11/19/12 1025           Assessment/Plan 1. Gallstone pancreatitis - s/p laparoscopic cholecystectomy with neg IOC 2. Hypertension  3. Recent fracture left tib-fib with eschar debridement 09/20/2012.  4. Body mass index is 33.3 ongoing weight loss through exercise and diet   Plan:  1.  Pt good for discharge home as early as this AM 2.  Will need f/u appt with [facility] in 2-3 weeks for recheck 3.  Cont weight loss      LOS: 4 days    DORT, Zakhai Meisinger 11/20/2012, 8:09 AM Pager: [phone]

## 2012-11-20 NOTE — Progress Notes (Signed)
Subjective: No complaints this morning. Eating, ambulating, and passing gas. No nausea or abdominal pain.  Tolerated lap chole well yesterday. Intraop cholangiogram negative.  Denies CP, SOB, dizziness.   Objective: Vital signs in last 24 hours: Filed Vitals:   11/19/12 1824 11/19/12 2224 11/20/12 0206 11/20/12 0519  BP: 132/59 110/43 123/43 122/68  Pulse: 62 54 62 54  Temp: 98.3 F (36.8 C) 98.6 F (37 C) 98.7 F (37.1 C) 97.8 F (36.6 C)  TempSrc: Oral Oral Oral Oral  Resp: 18 18 18 18   Height:      Weight:      SpO2: 100% 99% 98% 98%   Weight change:   Intake/Output Summary (Last 24 hours) at 11/20/12 0948 Last data filed at 11/20/12 0600  Gross per 24 hour  Intake 3143.75 ml  Output   1250 ml  Net 1893.75 ml   Vitals reviewed. General: sitting up at table doing puzzle.  HEENT: PERRL, EOMI, no scleral icterus Cardiac: RRR, no rubs, murmurs or gallops Pulm: clear to auscultation bilaterally, no wheezes, rales, or rhonchi Abd: Lap incision sites are bandaged, no erythema, pain, induration or drainage. +BS Ext: L upper thigh wound examined,  no erythema/warmth/induration or drainage. Warm and well perfused extremities.  Neuro: alert and oriented X3, cranial nerves II-XII grossly intact, strength and sensation to light touch equal in bilateral upper and lower extremities  Lab Results: Basic Metabolic Panel:  Lab 11/20/12 1191 11/19/12 0630 11/16/12 0838  NA 142 141 --  K 3.9 3.2* --  CL 105 103 --  CO2 27 29 --  GLUCOSE 94 100* --  BUN 12 11 --  CREATININE 0.76 0.66 --  CALCIUM 8.7 9.3 --  MG -- -- 2.1  PHOS -- -- --   Liver Function Tests:  Lab 11/20/12 0555 11/19/12 0630  AST 51* 49*  ALT 106* 144*  ALKPHOS 181* 240*  BILITOT 0.3 0.3  PROT 6.1 7.4  ALBUMIN 2.7* 3.2*    Lab 11/19/12 0630 11/18/12 0625  LIPASE 83* 60*  AMYLASE -- --   CBC:  Lab 11/19/12 0630 11/18/12 0625 11/16/12 0838  WBC 3.9* 3.7* --  NEUTROABS 2.0 -- 5.5  HGB 12.1 12.0 --    HCT 36.9 35.9* --  MCV 91.1 91.3 --  PLT 200 179 --   Urinalysis:  Lab 11/16/12 1119  COLORURINE AMBER*  LABSPEC 1.024  PHURINE 5.5  GLUCOSEU NEGATIVE  HGBUR NEGATIVE  BILIRUBINUR SMALL*  KETONESUR NEGATIVE  PROTEINUR NEGATIVE  UROBILINOGEN 1.0  NITRITE NEGATIVE  LEUKOCYTESUR SMALL*   Studies/Results: Dg Cholangiogram Operative  11/19/2012  *RADIOLOGY REPORT*  Clinical Data:   Cholelithiasis, symptomatic.  INTRAOPERATIVE CHOLANGIOGRAM  Technique:  Cholangiographic images from the C-arm fluoroscopic device were submitted for interpretation post-operatively.  Please see the procedural report for the amount of contrast and the fluoroscopy time utilized.  Comparison:  None.  Findings:  The gallbladder has been removed and the cystic duct cannulated.  There is good filling of the common bile duct, common hepatic duct, and intrahepatic ducts.  There is no visible filling defect.  There is prompt passage of contrast into the duodenum. Slight reflux into the pancreatic duct is noted.  IMPRESSION: Negative operative cholangiogram.   Original Report Authenticated By: Davonna Belling, M.D.    Medications: I have reviewed the patient's current medications.  Scheduled Meds:    . antiseptic oral rinse  15 mL Mouth Rinse q12n4p  . chlorhexidine  15 mL Mouth Rinse BID  . metoprolol succinate  100 mg Oral Daily  . silver sulfADIAZINE   Topical BID  . triamterene-hydrochlorothiazide  1 each Oral Daily   Continuous Infusions:    . dexrose 5 % and 0.45 % NaCl with KCl 30 mEq/L 1,000 mL (11/19/12 1621)   PRN Meds:.acetaminophen, acetaminophen, HYDROcodone-acetaminophen, ondansetron (ZOFRAN) IV, ondansetron  Assessment/Plan: [name] is a 62 yo female with past medical history significant for hypertension and cholethiasis presenting with abdominal pain and nausea, found to have gallstone pancreatitis and dilated common bile duct.   1) Gallstone pancreatitis S/p lap chole with negative intraop  cholangiogram. Procedure uncomplicated, pt walking, eating, passing gas.   - home today w outpt f/u  2) Hypokalemia Baseline potassium 3.1-3.3. Has refused supplementation in the past. Stable 3.3-3.9 during admission. 3) Hypertension Patient on atenolol and maxide at home. Atenolol was held on admission as HR was in the 50s. Metoprolol substituted for atenolol in setting of bradycardia. Normotensive BPs.  - Continue metoprolol 100SR and maxide on discharge  DVT prophy -  lovenox  Dispo: Disposition is deferred at this time, awaiting improvement of current medical problems.  Anticipated discharge in approximately tomorrow am.   The patient does have a current PCP (Pcp Not In System), therefore will not be requiring OPC follow-up after discharge.   The patient does not have transportation limitations that hinder transportation to clinic appointments.  .Services Needed at time of discharge: Y = Yes, Blank = No PT:   OT:   RN:   Equipment:   Other:     LOS: 4 days   Bronson Curb 11/20/2012, 9:48 AM

## 2012-11-23 ENCOUNTER — Encounter (HOSPITAL_COMMUNITY): Payer: Self-pay | Admitting: Surgery

## 2012-11-26 ENCOUNTER — Encounter: Payer: Self-pay | Admitting: Internal Medicine

## 2012-12-01 ENCOUNTER — Encounter (INDEPENDENT_AMBULATORY_CARE_PROVIDER_SITE_OTHER): Payer: Self-pay

## 2012-12-03 ENCOUNTER — Encounter: Payer: Self-pay | Admitting: Internal Medicine

## 2012-12-03 ENCOUNTER — Ambulatory Visit (INDEPENDENT_AMBULATORY_CARE_PROVIDER_SITE_OTHER): Payer: Self-pay | Admitting: Internal Medicine

## 2012-12-03 VITALS — BP 142/74 | HR 56 | Temp 97.3°F | Ht 59.0 in | Wt 180.6 lb

## 2012-12-03 DIAGNOSIS — I10 Essential (primary) hypertension: Secondary | ICD-10-CM

## 2012-12-03 DIAGNOSIS — Z598 Other problems related to housing and economic circumstances: Secondary | ICD-10-CM

## 2012-12-03 DIAGNOSIS — K851 Biliary acute pancreatitis without necrosis or infection: Secondary | ICD-10-CM

## 2012-12-03 DIAGNOSIS — K8020 Calculus of gallbladder without cholecystitis without obstruction: Secondary | ICD-10-CM

## 2012-12-03 DIAGNOSIS — Z0000 Encounter for general adult medical examination without abnormal findings: Secondary | ICD-10-CM | POA: Insufficient documentation

## 2012-12-03 NOTE — Assessment & Plan Note (Addendum)
Patient recovered well from her laparoscopic cholecystectomy. Today she is asymptomatic. She feels good today. No further treatment indicated. Will check CMP to make sure her liver function is okay.

## 2012-12-03 NOTE — Patient Instructions (Signed)
1. you can continue atenolol for your high blood pressure. If you develop shortness of breath or palpitation, please come back to the hospital.  2. Please take all medications as prescribed.  3. If you have worsening of your symptoms or new symptoms arise, please call the clinic ([phone]), or go to the ER immediately if symptoms are severe.

## 2012-12-03 NOTE — Progress Notes (Signed)
Patient ID: Jennifer [name], female   DOB: 02-Jun-1951, 62 y.o.   MRN: [id number] Subjective:   Patient ID: Jennifer [name] female   DOB: September 12, 1951 62 y.o.   MRN: [id number]  CC:  Hospital followup visit for gallstone pancreatitis. HPI:  [name] is a 62 y.o. lady with past medical history as outlined below, who presents for a hospital followup visit today.  62.)  Gallstone pancreatitis: Patient was recently hospitalized from 10/17/12 to 11/20/12 due to gallstone pancreatitis. Patient's lipase was elevated up to more than 3000 on admission. Patient received laparoscopic cholecystectomy. Her lipase backed to 83 on 11/19/12. She recovered very well from her surgery. She does not have any abdominal pain, nausea or vomiting today. She feels good, has no complaints. Her surgical site healed completely.  2.) hypertension: Patient's hypertension used to be controlled by Maxzide plus atenolol. In her recent hospital admission, she was found to have bradycardia. Her atenolol was switched to metoprolol. Patient reports that metoprolol is too expensive for her because of lack of insurance. She switched metoprolol back to atenolol 50 mg daily by herself. Today her blood pressure is 142/74. She does not have any chest pain, shortness of breath or palpitation. She feels good.  Past Medical History  Diagnosis Date  . Hypertension   . Gall bladder stones    Current Outpatient Prescriptions  Medication Sig Dispense Refill  . atenolol (TENORMIN) 50 MG tablet Take 50 mg by mouth daily.      Marland Kitchen triamterene-hydrochlorothiazide (MAXZIDE-25) 37.5-25 MG per tablet Take 1 tablet by mouth daily.         No family history on file. History   Social History  . Marital Status: Divorced    Spouse Name: N/A    Number of Children: N/A  . Years of Education: N/A   Social History Main Topics  . Smoking status: Never Smoker   . Smokeless tobacco: Never Used  . Alcohol Use: No  . Drug Use: No  . Sexually Active:  Yes    Birth Control/ Protection: Post-menopausal   Other Topics Concern  . None   Social History Narrative  . None    Review of Systems: General: no fevers, chills, no changes in body weight, no changes in appetite Skin: no rash HEENT: no blurry vision, hearing changes or sore throat Pulm: no dyspnea, coughing, wheezing CV: no chest pain, palpitations, shortness of breath Abd: no nausea/vomiting, abdominal pain, diarrhea/constipation GU: no dysuria, hematuria, polyuria Ext: no arthralgias, myalgias Neuro: no weakness, numbness, or tingling  Objective:  Physical Exam: Filed Vitals:   12/03/12 1502  BP: 142/74  Pulse: 56  Temp: 97.3 F (36.3 C)  TempSrc: Oral  Height: 4\' 11"  (1.499 m)  Weight: 180 lb 9.6 oz (81.92 kg)  SpO2: 99%   General: Not in acute distress HEENT: PERRL, EOMI, no scleral icterus, No bruit or JVD Cardiac: S1/S2, RRR, No murmurs, gallops or rubs Pulm: Good air movement bilaterally, Clear to auscultation bilaterally, No rales, wheezing, rhonchi or rubs. Abd: Soft,  nondistended, nontender, no rebound pain, no organomegaly, BS present. Surgical sites healed completely. Ext: No rashes or edema, 2+DP/PT pulse bilaterally Musculoskeletal: No joint deformities, erythema, or stiffness, ROM full and nontender Skin: no rashes. No skin bruise. Neuro: Alert and oriented X3, cranial nerves II-XII grossly intact, muscle strength 5/5 in all extremeties,  sensation to light touch intact.  Psych: patient is not psychotic, no suicidal or hemocidal ideation.    Assessment & Plan:

## 2012-12-03 NOTE — Assessment & Plan Note (Signed)
Patient does not have insurance. She did not noticed any blood in her stool. She would like to postpone all healthcare maintenance related tests. Will give her referral to social work for orange card application.

## 2012-12-03 NOTE — Assessment & Plan Note (Addendum)
BP Readings from Last 3 Encounters:  12/03/12 142/74  11/20/12 122/68  11/20/12 122/68    Lab Results  Component Value Date   NA 142 11/20/2012   K 3.9 11/20/2012   CREATININE 0.76 11/20/2012    Assessment:  Blood pressure control: controlled  Progress toward BP goal:  at goal  Comments:   Plan:  Medications:  continue current medications  Educational resources provided: brochure  Self management tools provided:    Other plans: Patient is currently taking atenolol 50 mg daily and Maxzide at home. Her blood pressure is well controlled at 142/74. Heart rate is 56/minute. Patient does not have chest pain, shortness of breath or palpitation. We'll continue current regimen.

## 2012-12-04 LAB — COMPLETE METABOLIC PANEL WITH GFR
Albumin: 3.9 g/dL (ref 3.5–5.2)
CO2: 31 mEq/L (ref 19–32)
Calcium: 9.2 mg/dL (ref 8.4–10.5)
GFR, Est African American: >89 mL/min
GFR, Est Non African American: >89 mL/min
Glucose, Bld: 91 mg/dL (ref 70–99)
Sodium: 141 mEq/L (ref 135–145)
Total Bilirubin: 0.3 mg/dL (ref 0.3–1.2)
Total Protein: 7.3 g/dL (ref 6.0–8.3)

## 2013-04-16 ENCOUNTER — Telehealth: Payer: Self-pay | Admitting: *Deleted

## 2013-04-16 ENCOUNTER — Ambulatory Visit: Payer: No Typology Code available for payment source | Admitting: Internal Medicine

## 2013-04-16 NOTE — Telephone Encounter (Signed)
Pt called stating she had a fracture to left leg in 9/13.  She had a pins placed, this was done at [facility].  She had problems with that leg and was treated by Dr Lajoyce Corners in [place].  Azariya noted the area where the pin was placed, around ankle area, protruding under skin  and states her leg feels different. This was noted last Monday. Pt has [employer] card and wants to be seen.   Do you want her seen in clinic or advised to see Dr Lajoyce Corners?  Pt # [id number]

## 2013-04-16 NOTE — Telephone Encounter (Signed)
Pt is scheduled for this afternoon in clinic.  We can call Dr Lajoyce Corners after our visit.

## 2013-04-16 NOTE — Telephone Encounter (Signed)
The problem seems to be an extension of her previous fracture which will be best served by being seen by an orthopedic. We may have to see her as well to rule out infection and assess whether she will need antibiotics until she is seen by Dr. Lajoyce Corners.  In short: Appointments with Both [place] and Dr. Lajoyce Corners.  Kandiss Ihrig

## 2013-04-20 ENCOUNTER — Encounter: Payer: Self-pay | Admitting: Internal Medicine

## 2013-04-20 ENCOUNTER — Ambulatory Visit (INDEPENDENT_AMBULATORY_CARE_PROVIDER_SITE_OTHER): Payer: No Typology Code available for payment source | Admitting: Internal Medicine

## 2013-04-20 VITALS — BP 160/69 | HR 59 | Temp 97.8°F | Ht 59.5 in | Wt 204.7 lb

## 2013-04-20 DIAGNOSIS — S8290XD Unspecified fracture of unspecified lower leg, subsequent encounter for closed fracture with routine healing: Secondary | ICD-10-CM

## 2013-04-20 DIAGNOSIS — S8292XA Unspecified fracture of left lower leg, initial encounter for closed fracture: Secondary | ICD-10-CM | POA: Insufficient documentation

## 2013-04-20 DIAGNOSIS — S8292XD Unspecified fracture of left lower leg, subsequent encounter for closed fracture with routine healing: Secondary | ICD-10-CM

## 2013-04-20 DIAGNOSIS — I10 Essential (primary) hypertension: Secondary | ICD-10-CM

## 2013-04-20 NOTE — Patient Instructions (Addendum)
1. followup in 2 month 2, make sure to go to Dr. Audrie Lia appt.

## 2013-04-20 NOTE — Assessment & Plan Note (Addendum)
Patient is noted to be hypertensive with BP of 160/69, and repeated BP 144/80 and HR 55.  Patient reports medical compliance with her current therapy. She admits many stressors in her life recently.   --I discussed with patient about the optimal treatment of her hypertension. She should continue to do lifestyle changes including diet and weight loss.  - will consider change her regimen since she is at max dosage (Can not increase atenolol dosage due to bradycardia) in the near future if her lifestyle changes are not able to lower her BP. Patient agrees with the plan.
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
## 2013-04-20 NOTE — Assessment & Plan Note (Signed)
Patient noted a pin/screw protruding under her skin. No other c/o. No s/s infection or skin breakdown/ulcers on examination. No neurovascular abnormality noted. Patient is unsure whether she has felt a protruding pin in the past or not.   The etiology is unclear. This could be an incidental benign finding or a possible loose screw from her previously operated leg.  I think that she should see Dr. Lajoyce Corners as early as possible. I do not think that this is a surgical emergency given the fact that she is asymptomatic with unremarkable examination and intact neurovascular exam.   Dr. Audrie Lia office called and a f/u appt is scheduled for patient in 2 days on Thursday morning ( 04/21/13). Patient voices understanding and will follow up with Dr. Lajoyce Corners.

## 2013-04-21 LAB — BASIC METABOLIC PANEL WITH GFR
Calcium: 9.3 mg/dL (ref 8.4–10.5)
Chloride: 100 mEq/L (ref 96–112)
Creat: 0.8 mg/dL (ref 0.50–1.10)
GFR, Est African American: >89 mL/min
GFR, Est Non African American: 80 mL/min

## 2013-04-21 NOTE — Progress Notes (Signed)
Info faxed to Dr Lajoyce Corners office - pt aware of appt with Dr Lajoyce Corners 04/22/13 9:15AM. Stanton Kidney Daysen Gundrum RN 04/21/13 11:15AM

## 2013-04-23 NOTE — Progress Notes (Signed)
Case discussed with Dr. Li (at time of visit, soon after the resident saw the patient).  We reviewed the resident's history and exam and pertinent patient test results.  I agree with the assessment, diagnosis, and plan of care documented in the resident's note.  

## 2013-05-05 ENCOUNTER — Encounter (HOSPITAL_COMMUNITY): Payer: Self-pay | Admitting: Family Medicine

## 2013-05-05 ENCOUNTER — Emergency Department (HOSPITAL_COMMUNITY)
Admission: EM | Admit: 2013-05-05 | Discharge: 2013-05-05 | Disposition: A | Discharge status: Home / Self Care | Payer: No Typology Code available for payment source | Attending: Emergency Medicine | Admitting: Emergency Medicine

## 2013-05-05 ENCOUNTER — Emergency Department (HOSPITAL_COMMUNITY): Payer: No Typology Code available for payment source

## 2013-05-05 DIAGNOSIS — M719 Bursopathy, unspecified: Secondary | ICD-10-CM | POA: Insufficient documentation

## 2013-05-05 DIAGNOSIS — I10 Essential (primary) hypertension: Secondary | ICD-10-CM | POA: Insufficient documentation

## 2013-05-05 DIAGNOSIS — Z79899 Other long term (current) drug therapy: Secondary | ICD-10-CM | POA: Insufficient documentation

## 2013-05-05 DIAGNOSIS — M67911 Unspecified disorder of synovium and tendon, right shoulder: Secondary | ICD-10-CM

## 2013-05-05 DIAGNOSIS — M25511 Pain in right shoulder: Secondary | ICD-10-CM

## 2013-05-05 DIAGNOSIS — Z8719 Personal history of other diseases of the digestive system: Secondary | ICD-10-CM | POA: Insufficient documentation

## 2013-05-05 DIAGNOSIS — M67919 Unspecified disorder of synovium and tendon, unspecified shoulder: Secondary | ICD-10-CM | POA: Insufficient documentation

## 2013-05-05 MED ORDER — TRAMADOL HCL 50 MG PO TABS: 50.0000 mg | ORAL_TABLET | Freq: Four times a day (QID) | ORAL | Status: DC | PRN

## 2013-05-05 NOTE — Progress Notes (Signed)
Orthopedic Tech Progress Note Patient Details:  Jennifer [name] [birth year] [id number]  Ortho Devices Type of Ortho Device: Arm sling Ortho Device/Splint Interventions: Application   Cammer, Mickie Bail 05/05/2013, 12:50 PM

## 2013-05-05 NOTE — ED Provider Notes (Signed)
History     CSN: [id number]  Arrival date & time 05/05/13  1103   First MD Initiated Contact with Patient 05/05/13 1218      Chief Complaint  Patient presents with  . Shoulder Pain    (Consider location/radiation/quality/duration/timing/severity/associated sxs/prior treatment) HPI Comments: Patient is a 62 year old female who presents for right shoulder pain with onset 2 nights ago. Patient states that she was rolling over in bed and noticed a sudden onset, sharp pain in her right shoulder. Patient states that pain radiates from her anterior shoulder joint to her superior shoulder joint. Patient states that movement and palpation make the pain worse. She has tried oxycodone but is unsure if it alleviated her pain because it just made her drowsy. Patient denies fevers, trauma or injury, color change or pallor, numbness or tingling in her extremities, and extremity weakness.  Patient is a 62 y.o. female presenting with shoulder pain. The history is provided by the patient. No language interpreter was used.  Shoulder Pain This is a new problem. Episode onset: 2 days ago. The problem occurs intermittently. The problem has been unchanged. Associated symptoms include arthralgias. Pertinent negatives include no fever, numbness or weakness. Exacerbated by: Movement and palpation. Treatments tried: Oxycodone. Improvement on treatment: Unknown as patient states it just made her sleepy.    Past Medical History  Diagnosis Date  . Hypertension   . Gall bladder stones     Past Surgical History  Procedure Laterality Date  . Cesarean section    . Appendectomy    . I&d extremity  09/21/2012    Procedure: IRRIGATION AND DEBRIDEMENT EXTREMITY;  Surgeon: Nadara Mustard, MD;  Location: [facility] OR;  Service: Orthopedics;  Laterality: Left;  Excisional Debridement Left Thigh, apply wound vac  . Knee surgery    . Cholecystectomy  11/19/2012    Procedure: LAPAROSCOPIC CHOLECYSTECTOMY WITH INTRAOPERATIVE  CHOLANGIOGRAM;  Surgeon: Velora Heckler, MD;  Location: [facility] OR;  Service: General;  Laterality: N/A;    History reviewed. No pertinent family history.  History  Substance Use Topics  . Smoking status: Never Smoker   . Smokeless tobacco: Never Used  . Alcohol Use: No    OB History   Grav Para Term Preterm Abortions TAB SAB Ect Mult Living                  Review of Systems  Constitutional: Negative for fever.  Musculoskeletal: Positive for arthralgias. Negative for back pain.  Skin: Negative for color change, pallor and wound.  Neurological: Negative for weakness and numbness.  All other systems reviewed and are negative.    Allergies  Morphine and related  Home Medications   Current Outpatient Rx  Name  Route  Sig  Dispense  Refill  . atenolol (TENORMIN) 50 MG tablet   Oral   Take 50 mg by mouth daily.         . traMADol (ULTRAM) 50 MG tablet   Oral   Take 1 tablet (50 mg total) by mouth every 6 (six) hours as needed for pain.   15 tablet   0   . triamterene-hydrochlorothiazide (MAXZIDE-25) 37.5-25 MG per tablet   Oral   Take 1 tablet by mouth daily.             BP 158/66  Pulse 66  Temp(Src) 97.8 F (36.6 C) (Oral)  Resp 16  SpO2 99%  Physical Exam  Nursing note and vitals reviewed. Constitutional: She is oriented to person, place, and  time. She appears well-developed and well-nourished. No distress.  HENT:  Head: Normocephalic and atraumatic.  Eyes: Conjunctivae and EOM are normal. No scleral icterus.  Neck: Normal range of motion. Neck supple.  Cardiovascular: Normal rate, regular rhythm and intact distal pulses.   Distal radial pulses 2+ bilaterally. Capillary refill normal  Pulmonary/Chest: Effort normal. No respiratory distress.  Musculoskeletal:       Right shoulder: She exhibits decreased range of motion (Secondary to pain), tenderness and bony tenderness. She exhibits no swelling, no effusion, no crepitus, no deformity, normal pulse and  normal strength.       Cervical back: Normal.       Right upper arm: Normal.  Neurological: She is alert and oriented to person, place, and time.  No sensory or motor deficits appreciated. DTRs normal and symmetric.  Skin: Skin is warm and dry. No rash noted. She is not diaphoretic. No erythema. No pallor.  Psychiatric: She has a normal mood and affect. Her behavior is normal.    ED Course  Procedures (including critical care time)  Labs Reviewed - No data to display Dg Shoulder Right  05/05/2013   *RADIOLOGY REPORT*  Clinical Data: Right shoulder pain without injury  RIGHT SHOULDER - 2+ VIEW  Comparison: None.  Findings: Mild degenerative changes of the acromioclavicular joint are seen.  Some inferior subluxation of the humeral head with respect to the glenoid is noted although no true dislocation is seen.  No acute fracture is noted.  IMPRESSION: Apparent laxity in the shoulder joint with some inferior subluxation of the humeral head.  Degenerative changes of the acromioclavicular joint.   Original Report Authenticated By: Alcide Clever, M.D.    1. Shoulder pain, acute, right   2. Disorder of rotator cuff, right     MDM  Uncomplicated laxity or rotator cuff. Patient neurovascularly intact without sensory or motor deficits. Xray of shoulder shows laxity of shoulder joint with subluxation of humeral head without fracture or dislocation. Shoulder sling applied in ED for comfort. Patient appropriate for d/c with orthopedic follow up for further evaluation of symptoms. Rx for tramadol given for pain control. Indications for return discussed with patient who verbalizes comfort and understanding with plan without any unaddressed concerns.        Antony Madura, PA-C 05/10/13 1627

## 2013-05-05 NOTE — ED Notes (Signed)
Per pt sts over night she has started having right shoulder pain. sts hurts to move and she is unable to perform ADL. Denies injury

## 2013-05-10 ENCOUNTER — Other Ambulatory Visit (HOSPITAL_COMMUNITY): Payer: Self-pay | Admitting: Orthopedic Surgery

## 2013-05-10 DIAGNOSIS — M25511 Pain in right shoulder: Secondary | ICD-10-CM

## 2013-05-11 NOTE — ED Provider Notes (Signed)
Medical screening examination/treatment/procedure(s) were performed by non-physician practitioner and as supervising physician I was immediately available for consultation/collaboration.   Carleene Cooper III, MD 05/11/13 [phone]

## 2013-05-17 ENCOUNTER — Ambulatory Visit (HOSPITAL_COMMUNITY): Admission: RE | Admit: 2013-05-17 | Payer: No Typology Code available for payment source | Source: Ambulatory Visit

## 2013-05-17 ENCOUNTER — Other Ambulatory Visit (HOSPITAL_COMMUNITY): Payer: No Typology Code available for payment source

## 2013-09-23 ENCOUNTER — Other Ambulatory Visit: Payer: Self-pay

## 2013-10-19 ENCOUNTER — Ambulatory Visit: Payer: Self-pay

## 2013-10-27 ENCOUNTER — Other Ambulatory Visit: Payer: Self-pay | Admitting: Internal Medicine

## 2013-11-24 ENCOUNTER — Other Ambulatory Visit: Payer: Self-pay | Admitting: *Deleted

## 2013-11-24 MED ORDER — ATENOLOL 50 MG PO TABS: 50.0000 mg | ORAL_TABLET | Freq: Every day | ORAL | Status: DC

## 2013-11-24 MED ORDER — TRIAMTERENE-HCTZ 37.5-25 MG PO TABS: 1.0000 | ORAL_TABLET | Freq: Every day | ORAL | Status: DC

## 2013-12-23 ENCOUNTER — Ambulatory Visit (INDEPENDENT_AMBULATORY_CARE_PROVIDER_SITE_OTHER): Payer: Self-pay | Admitting: Internal Medicine

## 2013-12-23 ENCOUNTER — Encounter: Payer: Self-pay | Admitting: Internal Medicine

## 2013-12-23 VITALS — BP 152/74 | HR 56 | Temp 97.8°F | Ht 59.5 in | Wt 228.0 lb

## 2013-12-23 DIAGNOSIS — Z23 Encounter for immunization: Secondary | ICD-10-CM

## 2013-12-23 DIAGNOSIS — E785 Hyperlipidemia, unspecified: Secondary | ICD-10-CM

## 2013-12-23 DIAGNOSIS — I10 Essential (primary) hypertension: Secondary | ICD-10-CM

## 2013-12-23 LAB — LIPID PANEL
CHOL/HDL RATIO: 3.8 ratio
Cholesterol: 211 mg/dL — ABNORMAL HIGH (ref 0–200)
HDL: 56 mg/dL (ref >39)
LDL CALC: 123 mg/dL — AB (ref 0–99)
Triglycerides: 162 mg/dL — ABNORMAL HIGH (ref <150)
VLDL: 32 mg/dL (ref 0–40)

## 2013-12-23 LAB — BASIC METABOLIC PANEL WITH GFR
BUN: 17 mg/dL (ref 6–23)
CHLORIDE: 97 meq/L (ref 96–112)
CO2: 32 meq/L (ref 19–32)
CREATININE: 0.82 mg/dL (ref 0.50–1.10)
Calcium: 9.4 mg/dL (ref 8.4–10.5)
GFR, EST NON AFRICAN AMERICAN: 77 mL/min
GFR, Est African American: 89 mL/min
GLUCOSE: 98 mg/dL (ref 70–99)
POTASSIUM: 3.6 meq/L (ref 3.5–5.3)
Sodium: 143 mEq/L (ref 135–145)

## 2013-12-23 MED ORDER — HYDROCHLOROTHIAZIDE 25 MG PO TABS: 25.0000 mg | ORAL_TABLET | Freq: Every day | ORAL | Status: DC

## 2013-12-23 MED ORDER — LISINOPRIL 10 MG PO TABS: 10.0000 mg | ORAL_TABLET | Freq: Every day | ORAL | Status: DC

## 2013-12-23 NOTE — Assessment & Plan Note (Signed)
Assessment: BMI ~40. Patient states that she is eager to lose weight,  Plan: Will send her to weight loss classes at [facility] once she receives her [facility] card Will send nutritional consult with the clinic educator.

## 2013-12-23 NOTE — Patient Instructions (Signed)
1. Will start you on HCTZ 25 mg daily Will start ou on lisinopril 10 mg po daily  2. Check you BP daily at home and bring the readings to the clinic  3. Follow up in 2 weeks.   4. See donna for nutritional class next OV.   Calorie Counting Diet A calorie counting diet requires you to eat the number of calories that are right for you in a day. Calories are the measurement of how much energy you get from the food you eat. Eating the right amount of calories is important for staying at a healthy weight. If you eat too many calories, your body will store them as fat and you may gain weight. If you eat too few calories, you may lose weight. Counting the number of calories you eat during a day will help you know if you are eating the right amount. A Registered Dietitian can determine how many calories you need in a day. The amount of calories needed varies from person to person. If your goal is to lose weight, you will need to eat fewer calories. Losing weight can benefit you if you are overweight or have health problems such as heart disease, high blood pressure, or diabetes. If your goal is to gain weight, you will need to eat more calories. Gaining weight may be necessary if you have a certain health problem that causes your body to need more energy. TIPS Whether you are increasing or decreasing the number of calories you eat during a day, it may be hard to get used to changes in what you eat and drink. The following are tips to help you keep track of the number of calories you eat.  Measure foods at home with measuring cups. This helps you know the amount of food and number of calories you are eating.  Restaurants often serve food in amounts that are larger than 1 serving. While eating out, estimate how many servings of a food you are given. For example, a serving of cooked rice is  cup or about the size of half of a fist. Knowing serving sizes will help you be aware of how much food you are eating  at restaurants.  Ask for smaller portion sizes or child-size portions at restaurants.  Plan to eat half of a meal at a restaurant. Take the rest home or share the other half with a friend.  Read the Nutrition Facts panel on food labels for calorie content and serving size. You can find out how many servings are in a package, the size of a serving, and the number of calories each serving has.  For example, a package might contain 3 cookies. The Nutrition Facts panel on that package says that 1 serving is 1 cookie. Below that, it will say there are 3 servings in the container. The calories section of the Nutrition Facts label says there are 90 calories. This means there are 90 calories in 1 cookie (1 serving). If you eat 1 cookie you have eaten 90 calories. If you eat all 3 cookies, you have eaten 270 calories (3 servings x 90 calories = 270 calories). The list below tells you how big or small some common portion sizes are.  1 oz.........4 stacked dice.  3 oz........Marland KitchenDeck of cards.  1 tsp.......Marland KitchenTip of little finger.  1 tbs......Marland KitchenMarland KitchenThumb.  2 tbs.......Marland KitchenGolf ball.   cup......Marland KitchenHalf of a fist.  1 cup.......Marland KitchenA fist. KEEP A FOOD LOG Write down every food item you eat, the amount  you eat, and the number of calories in each food you eat during the day. At the end of the day, you can add up the total number of calories you have eaten. It may help to keep a list like the one below. Find out the calorie information by reading the Nutrition Facts panel on food labels. Breakfast  Bran cereal (1 cup, 110 calories).  Fat-free milk ( cup, 45 calories). Snack  Apple (1 medium, 80 calories). Lunch  Spinach (1 cup, 20 calories).  Tomato ( medium, 20 calories).  Chicken breast strips (3 oz, 165 calories).  Shredded cheddar cheese ( cup, 110 calories).  Light [place] dressing (2 tbs, 60 calories).  Whole-wheat bread (1 slice, 80 calories).  Tub margarine (1 tsp, 35  calories).  Vegetable soup (1 cup, 160 calories). Dinner  Pork chop (3 oz, 190 calories).  Brown rice (1 cup, 215 calories).  Steamed broccoli ( cup, 20 calories).  Strawberries (1  cup, 65 calories).  Whipped cream (1 tbs, 50 calories). Daily Calorie Total: 7035 Document Released: 11/04/2005 Document Revised: 01/27/2012 Document Reviewed: 05/01/2007 [facility] Patient Information 2014 [place].
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
## 2013-12-23 NOTE — Assessment & Plan Note (Addendum)
BP Readings from Last 3 Encounters:  12/23/13 150/84  05/05/13 158/66  04/20/13 160/69    Lab Results  Component Value Date   Jennifer [name] 141 04/20/2013   K 4.1 04/20/2013   CREATININE 0.80 04/20/2013    Assessment: Blood pressure control: mildly elevated Progress toward BP goal:  unchanged Patient is noted to have mild to moderate BP on current regimen. Her HR is 60.  Plan: 1. Medications:     Given her resting HR of 60, I would not increase BB dosage.  I will start her on ACEI--lisinopril 10 daily   Will change her Maxize-25 to HCTZ 25 mg po daily.  Instruct pt to check her BP daily and follow up in 2 weeks.   2. Educational resources provided:     Importance of medical compliance and daily BP monitoring discussed with patient.   3. Self management tools provided:     Patient is instructed to check her BP at home.   4. Will check her BMP and Lipid panel today. Follow up BMP in 2-3 weeks

## 2013-12-23 NOTE — Addendum Note (Signed)
Addended byNicoletta Dress, Brinson Tozzi on: 12/23/2013 04:59 PM   Modules accepted: Level of Service

## 2013-12-24 DIAGNOSIS — E785 Hyperlipidemia, unspecified: Secondary | ICD-10-CM | POA: Insufficient documentation

## 2013-12-24 NOTE — Assessment & Plan Note (Signed)
Addendum Lipid Panel     Component Value Date/Time   CHOL 211* 12/23/2013 1515   TRIG 162* 12/23/2013 1515   HDL 56 12/23/2013 1515   CHOLHDL 3.8 12/23/2013 1515   VLDL 32 12/23/2013 1515   LDLCALC 123* 12/23/2013 1515    Plan: - LDL 123.  -Life style change discussed with patient. She agrees with the plan.

## 2013-12-27 NOTE — Progress Notes (Signed)
Case discussed with Dr. Li at the time of the visit.  We reviewed the resident's history and exam and pertinent patient test results.  I agree with the assessment, diagnosis, and plan of care documented in the resident's note. 

## 2014-01-04 ENCOUNTER — Encounter: Payer: Self-pay | Admitting: Internal Medicine

## 2014-01-13 ENCOUNTER — Encounter: Payer: Self-pay | Admitting: Dietician

## 2014-01-13 ENCOUNTER — Encounter: Payer: Self-pay | Admitting: Internal Medicine

## 2014-01-19 NOTE — Addendum Note (Signed)
Addended by: Hulan Fray on: 01/19/2014 05:14 PM   Modules accepted: Orders

## 2014-03-15 LAB — HEMOCCULT SLIDES (X 3 CARDS)

## 2014-12-14 ENCOUNTER — Encounter: Payer: Self-pay | Admitting: Internal Medicine

## 2014-12-14 ENCOUNTER — Ambulatory Visit (INDEPENDENT_AMBULATORY_CARE_PROVIDER_SITE_OTHER): Payer: No Typology Code available for payment source | Admitting: Internal Medicine

## 2014-12-14 VITALS — BP 159/56 | HR 60 | Temp 98.2°F | Ht 59.0 in | Wt 215.8 lb

## 2014-12-14 DIAGNOSIS — G5603 Carpal tunnel syndrome, bilateral upper limbs: Secondary | ICD-10-CM

## 2014-12-14 DIAGNOSIS — Z0000 Encounter for general adult medical examination without abnormal findings: Secondary | ICD-10-CM

## 2014-12-14 DIAGNOSIS — I10 Essential (primary) hypertension: Secondary | ICD-10-CM

## 2014-12-14 DIAGNOSIS — G5600 Carpal tunnel syndrome, unspecified upper limb: Secondary | ICD-10-CM | POA: Insufficient documentation

## 2014-12-14 DIAGNOSIS — G5602 Carpal tunnel syndrome, left upper limb: Secondary | ICD-10-CM

## 2014-12-14 DIAGNOSIS — G5601 Carpal tunnel syndrome, right upper limb: Secondary | ICD-10-CM

## 2014-12-14 LAB — BASIC METABOLIC PANEL WITH GFR
BUN: 15 mg/dL (ref 6–23)
CHLORIDE: 99 meq/L (ref 96–112)
CO2: 34 meq/L — AB (ref 19–32)
Calcium: 9.2 mg/dL (ref 8.4–10.5)
Creat: 0.81 mg/dL (ref 0.50–1.10)
GFR, EST AFRICAN AMERICAN: 89 mL/min
GFR, Est Non African American: 78 mL/min
Glucose, Bld: 98 mg/dL (ref 70–99)
POTASSIUM: 3.6 meq/L (ref 3.5–5.3)
Sodium: 140 mEq/L (ref 135–145)

## 2014-12-14 MED ORDER — TRIAMTERENE-HCTZ 37.5-25 MG PO TABS: 2.0000 | ORAL_TABLET | Freq: Every day | ORAL | Status: DC

## 2014-12-14 MED ORDER — ATENOLOL 50 MG PO TABS: 50.0000 mg | ORAL_TABLET | Freq: Every day | ORAL | Status: DC

## 2014-12-14 NOTE — Assessment & Plan Note (Signed)
With a likely contribution of arthritis.  Bracing has helped and ibuprofen appears to be helping with the arthritis given no proven efficacy in CTS.  She may benefit from steroid injections.  She has discussed surgery in the past, but she has declined. -Refer to sport medicine for possible steroid injections. -Continue ibuprofen PRN for arthritis.

## 2014-12-14 NOTE — Patient Instructions (Signed)
Thank you for coming to clinic today Ms Jennifer [name].  General instructions: -Increase your Maxide to 2 tablets once a day. -I referred you to sports medicine for your carpal tunnel syndrome. -Continue to use your braces and ibuprofen as needed for your pain. -We will call you with your mammogram appointment. -I would like to check some labs today. I'll let you know if anything is abnormal. -Please make a follow up appointment to return to clinic in 1 month to recheck your blood pressure and perform a Pap smear.  Thank you for bringing your medicines today. This helps [place] keep you safe from mistakes.

## 2014-12-14 NOTE — Assessment & Plan Note (Addendum)
Has been using FOBT cards for colon cancer screening, which was negative last year. -Ordered mammogram. -Gave patient FOBT cards to bring back at next appointment. -Return in 1 month for pap smear. -Refused flu shot.

## 2014-12-14 NOTE — Progress Notes (Signed)
Subjective:    Patient ID: Jennifer [name], female    DOB: 09-19-1951, 64 y.o.   MRN: [id number]  HPI Jennifer [name] is a 64 year old woman with history of hypertension, gallstones, and pancreatitis who presents for routine follow-up.  She reports doing well overall.  She does reports some pain and numbness in her hands.  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  She wears braces on both of her arms at night, and she takes Advil to help with the pain.  She has never had steroid injections or other procedures performed.  She is planning to retire from her job in a year and a half.  Review of Systems  Constitutional: Negative for fever, chills and fatigue.  HENT: Negative for congestion, rhinorrhea and sore throat.   Eyes: Negative for visual disturbance.  Respiratory: Negative for cough and shortness of breath.   Cardiovascular: Negative for chest pain.  Gastrointestinal: Positive for constipation (Takes Mg citrate.). Negative for nausea, vomiting, abdominal pain and diarrhea.  Genitourinary: Negative for dysuria and difficulty urinating.  Musculoskeletal: Positive for myalgias and arthralgias. Negative for back pain.  Skin: Negative for rash.  Neurological: Negative for dizziness, weakness, numbness and headaches.       Objective:   Physical Exam  Constitutional: She is oriented to person, place, and time. She appears well-developed and well-nourished. No distress.  Obese.  HENT:  Head: Normocephalic and atraumatic.  Mouth/Throat: No oropharyngeal exudate.  Eyes: Conjunctivae and EOM are normal. Pupils are equal, round, and reactive to light. No scleral icterus.  Neck: Normal range of motion. Neck supple.  Cardiovascular: Normal rate, regular rhythm and normal heart sounds.   Pulmonary/Chest: Effort normal and breath sounds normal. No respiratory distress. She has no wheezes.    Abdominal: Soft. Bowel sounds are normal. She exhibits no distension. There is no tenderness.  Musculoskeletal: Normal range of motion. She exhibits tenderness (Palpation of radial side of wrist bilaterally, Phalen's test positive.). She exhibits no edema.  Neurological: She is alert and oriented to person, place, and time. No cranial nerve deficit. She exhibits normal muscle tone.  Skin: Skin is warm and dry. No rash noted. No erythema.          Assessment & Plan:  Please see problem-based assessment and plan.

## 2014-12-14 NOTE — Assessment & Plan Note (Signed)
BP Readings from Last 3 Encounters:  12/14/14 159/56  12/23/13 152/74  05/05/13 158/66    Lab Results  Component Value Date   NA 143 12/23/2013   K 3.6 12/23/2013   CREATININE 0.82 12/23/2013    Assessment: Blood pressure control: moderately elevated Progress toward BP goal:  unchanged Comments: Switched back to old regimen after previous appointment (Maxzide 37.5-25 mg, atenolol 50 mg daily) because she had palpitations on lisinopril and HCTZ.  BP still elevated as it was then.  Plan: Medications:  Continue atenolol 50 mg daily, increase Maxzide to 75-50 mg daily. Educational resources provided: handout Self management tools provided: home blood pressure logbook Other plans: Will check BMP today for K given increasing Maxzide.  Return to clinic in 1 month for BP recheck and repeat BMP.

## 2014-12-20 NOTE — Progress Notes (Signed)
Internal Medicine Clinic Attending  Case discussed with Dr. Moding at the time of the visit.  We reviewed the resident's history and exam and pertinent patient test results.  I agree with the assessment, diagnosis, and plan of care documented in the resident's note. 

## 2014-12-27 ENCOUNTER — Encounter: Payer: Self-pay | Admitting: Internal Medicine

## 2015-01-05 ENCOUNTER — Ambulatory Visit: Payer: No Typology Code available for payment source | Admitting: Sports Medicine

## 2015-01-05 NOTE — Addendum Note (Signed)
Addended by: Hulan Fray on: 01/05/2015 07:09 PM   Modules accepted: Orders

## 2015-01-23 ENCOUNTER — Encounter: Payer: Self-pay | Admitting: *Deleted

## 2015-01-25 ENCOUNTER — Encounter: Payer: Self-pay | Admitting: Internal Medicine

## 2015-03-06 LAB — HM PAP SMEAR: HM Pap smear: NEGATIVE

## 2015-03-17 ENCOUNTER — Encounter: Payer: Self-pay | Admitting: *Deleted

## 2015-03-18 ENCOUNTER — Encounter: Payer: Self-pay | Admitting: Internal Medicine

## 2015-04-19 ENCOUNTER — Encounter: Payer: Self-pay | Admitting: Internal Medicine

## 2015-07-27 ENCOUNTER — Encounter: Payer: Self-pay | Admitting: Internal Medicine

## 2015-09-30 ENCOUNTER — Ambulatory Visit
Admission: EM | Admit: 2015-09-30 | Discharge: 2015-09-30 | Disposition: A | Discharge status: Home / Self Care | Payer: No Typology Code available for payment source | Attending: Family Medicine | Admitting: Family Medicine

## 2015-09-30 DIAGNOSIS — B350 Tinea barbae and tinea capitis: Secondary | ICD-10-CM | POA: Diagnosis not present

## 2015-09-30 MED ORDER — GRISEOFULVIN MICROSIZE 500 MG PO TABS: 500.0000 mg | ORAL_TABLET | Freq: Every day | ORAL | Status: AC

## 2015-09-30 NOTE — ED Provider Notes (Signed)
CSN: [id number]     Arrival date & time 09/30/15  [id number] History   First MD Initiated Contact with Patient 09/30/15 [phone]     Chief Complaint  Patient presents with  . Rash    Round patch on back of head noticed it about a month ago. Works at [employer] and thinks she may have picked up something there. No pain, no itching.    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  PMHx hypertension, leg fracture  PSHx broken leg, hematoma leg, appendectomy, cholecystectomy  FHx: MI, cancer, HTN  The history is provided by the patient.    Past Medical History  Diagnosis Date  . Hypertension   . Gall bladder stones    Past Surgical History  Procedure Laterality Date  . Cesarean section    . Appendectomy    . I&d extremity  09/21/2012    Procedure: IRRIGATION AND DEBRIDEMENT EXTREMITY;  Surgeon: Newt Minion, MD;  Location: [place];  Service: Orthopedics;  Laterality: Left;  Excisional Debridement Left Thigh, apply wound vac  . Knee surgery    . Cholecystectomy  11/19/2012    Procedure: LAPAROSCOPIC CHOLECYSTECTOMY WITH INTRAOPERATIVE CHOLANGIOGRAM;  Surgeon: Earnstine Regal, MD;  Location: [place];  Service: General;  Laterality: N/A;  . Orthopedic surgery     History reviewed. No pertinent family history. Social History  Substance Use Topics  . Smoking status: Never Smoker   . Smokeless tobacco: Never Used  . Alcohol Use: No   OB History    No data available     Review of Systems  Constitutional: Negative for fever and chills.  HENT: Negative for congestion, ear pain and sore throat.   Eyes: Negative for  pain and discharge.  Respiratory: Negative for cough and wheezing.   Cardiovascular: Negative for chest pain and leg swelling.  Gastrointestinal: Negative for nausea, vomiting, diarrhea, constipation and blood in stool.  Genitourinary: Negative for dysuria, hematuria and difficulty urinating.  Musculoskeletal: Negative for myalgias, back pain and arthralgias.  Skin: Positive for color change and rash. Negative for pallor and wound.  Allergic/Immunologic: Negative for environmental allergies and food allergies.  Neurological: Negative for headaches.  Hematological: Negative for adenopathy. Does not bruise/bleed easily.  Psychiatric/Behavioral: Negative for confusion and agitation. The patient is not nervous/anxious.     Allergies  Morphine and related and Epinephrine  Home Medications   Prior to Admission medications   Medication Sig Start Date End Date Taking? Authorizing Provider  atenolol (TENORMIN) 50 MG tablet Take 1 tablet (50 mg total) by mouth daily. 12/14/14   Langley Gauss Moding, MD  BIOTIN PO Take 1 tablet by mouth daily.    Historical Provider, MD  co-enzyme Q-10 30 MG capsule Take 50 mg by mouth 2 (two) times daily.    Historical Provider, MD  griseofulvin (GRIFULVIN V) 500 MG tablet Take 1 tablet (500 mg total) by mouth daily. 09/30/15 11/11/15  Olen Cordial, NP  MAGNESIUM CITRATE PO Take 1 tablet by mouth at bedtime.    Historical Provider, MD  triamterene-hydrochlorothiazide (MAXZIDE-25) 37.5-25 MG per tablet Take 2 tablets by mouth daily. 12/14/14   Charlesetta Shanks, MD  vitamin C (  ASCORBIC ACID) 500 MG tablet Take 500 mg by mouth 2 (two) times daily.    Historical Provider, MD   Meds Ordered and Administered this Visit  Medications - No data to display  BP 168/68 mmHg  Pulse 61  Temp(Src) 98.1 F (36.7 C) (Oral)  Resp 18  Ht 5' (1.524 m)  Wt 225 lb (102.059 kg)  BMI 43.94 kg/m2  SpO2 97% No data found.   Physical Exam  Constitutional: She is oriented to  person, place, and time. She appears well-developed and well-nourished. She is active and cooperative.  Non-toxic appearance. She does not have a sickly appearance. She does not appear ill. No distress.  HENT:  Head: Normocephalic and atraumatic.  Right Ear: Hearing, external ear and ear canal normal. A middle ear effusion is present.  Left Ear: Hearing, external ear and ear canal normal. A middle ear effusion is present.  Nose: No mucosal edema, rhinorrhea, nose lacerations, sinus tenderness, nasal deformity, septal deviation or nasal septal hematoma. No epistaxis.  No foreign bodies. Right sinus exhibits no maxillary sinus tenderness and no frontal sinus tenderness. Left sinus exhibits no maxillary sinus tenderness and no frontal sinus tenderness.  Mouth/Throat: Uvula is midline and mucous membranes are normal. Mucous membranes are not pale, not dry and not cyanotic. She does not have dentures. No oral lesions. No trismus in the jaw. Normal dentition. No dental abscesses, uvula swelling, lacerations or dental caries. Posterior oropharyngeal edema and posterior oropharyngeal erythema present. No oropharyngeal exudate or tonsillar abscesses.  Cobblestoning posterior pharynx and bilateral TMs with air fluid level clear  Eyes: Conjunctivae, EOM and lids are normal. Pupils are equal, round, and reactive to light. Right eye exhibits no chemosis, no discharge, no exudate and no hordeolum. No foreign body present in the right eye. Left eye exhibits no chemosis, no discharge, no exudate and no hordeolum. No foreign body present in the left eye. Right conjunctiva is not injected. Right conjunctiva has no hemorrhage. Left conjunctiva is not injected. Left conjunctiva has no hemorrhage. No scleral icterus. Right eye exhibits normal extraocular motion and no nystagmus. Left eye exhibits normal extraocular motion and no nystagmus. Right pupil is round and reactive. Left pupil is round and reactive. Pupils are equal.    Neck: Trachea normal and normal range of motion. Neck supple. No tracheal tenderness, no spinous process tenderness and no muscular tenderness present. No rigidity. No tracheal deviation, no edema, no erythema and normal range of motion present. No thyroid mass and no thyromegaly present.  Cardiovascular: Normal rate, regular rhythm, S1 normal, S2 normal, normal heart sounds and intact distal pulses.  PMI is not displaced.  Exam reveals no gallop and no friction rub.   No murmur heard. Pulmonary/Chest: Effort normal and breath sounds normal. No accessory muscle usage or stridor. No respiratory distress. She has no decreased breath sounds. She has no wheezes. She has no rhonchi. She has no rales. She exhibits no tenderness.  Abdominal: Soft. She exhibits no distension.  Musculoskeletal: Normal range of motion. She exhibits no edema or tenderness.       Right shoulder: Normal.       Left shoulder: Normal.       Right hip: Normal.       Left hip: Normal.       Right knee: Normal.       Left knee: Normal.       Cervical back: Normal.       Thoracic back: Normal.  Right hand: Normal.       Left hand: Normal.  Lymphadenopathy:       Head (right side): No submental, no submandibular, no tonsillar, no preauricular, no posterior auricular and no occipital adenopathy present.       Head (left side): No submental, no submandibular, no tonsillar, no preauricular, no posterior auricular and no occipital adenopathy present.    She has no cervical adenopathy.       Right cervical: No superficial cervical, no deep cervical and no posterior cervical adenopathy present.      Left cervical: No superficial cervical, no deep cervical and no posterior cervical adenopathy present.  Neurological: She is alert and oriented to person, place, and time. She has normal strength. She is not disoriented. She displays no atrophy and no tremor. No cranial nerve deficit or sensory deficit. She exhibits normal muscle  tone. She displays no seizure activity. Coordination and gait normal. GCS eye subscore is 4. GCS verbal subscore is 5. GCS motor subscore is 6.  Skin: Skin is warm, dry and intact. Rash noted. No abrasion, no bruising, no burn, no ecchymosis, no laceration, no lesion, no petechiae and no purpura noted. Rash is maculopapular. Rash is not macular, not papular, not nodular, not pustular, not vesicular and not urticarial. She is not diaphoretic. No cyanosis or erythema. No pallor. Nails show no clubbing.     2cm nummular white scaley erythema raised lesion scalp  Woods lamp fluoresced white  Psychiatric: She has a normal mood and affect. Her speech is normal and behavior is normal. Judgment and thought content normal. Cognition and memory are normal.  Nursing note and vitals reviewed.   ED Course  Procedures (including critical care time)  Labs Review Labs Reviewed - No data to display  Imaging Review No results found.   MDM   1. Tinea capitis    Medication as directed failed topical starting Rx griseofulvin 500mg  po daily x 6 weeks.  If does not heal recommend follow up with dermatology.  Shower after work.  Wash sheets/blankets/towels in hot water at least weekly if not more frequent.    Exitcare handout on tinea capitis given to patient.   Reoccurrence of condition common and may require retreatment.  Patient verbalized agreement and understanding of treatment plan and had no further questions at this time.   P2:  Avoidance and hand washing.    Olen Cordial, NP 10/01/15 1810

## 2015-09-30 NOTE — Discharge Instructions (Signed)
Scalp Ringworm, Pediatric Scalp ringworm (tinea capitis) is a fungal infection of the skin on the scalp. This condition is easily spread from person to person (contagious). It can also be spread from animals to humans. HOME CARE  Give or apply over-the-counter and prescription medicines only as told by your child's doctor. This may include giving medicine for up to 6-8 weeks to kill the fungus.  Check your household members and your pets, if this applies, for ringworm. Do this often to make sure they do not get the condition.  Do not let your child share:  Brushes.  Combs.  Barrettes.  Hats.  Towels.   Clean and disinfect all combs, brushes, and hats that your child wears or uses. Throw away any natural bristle brushes.  Do not give your child a short haircut or shave his or her head while he or she is being treated.  Do not let your child go back to school until the doctor says it is okay.  Keep all follow-up visits as told by your child's doctor. This is important. GET HELP IF:  Your child's rash gets worse.  Your child's rash spreads.  Your child's rash comes back after treatment is done.  Your child's rash does not get better with treatment.  Your child has a fever.  Your child's rash is painful and medicine does not help the pain.  Your child's rash becomes red, warm, tender, and swollen. GET HELP RIGHT AWAY IF:  Your child has yellowish-white fluid (pus) coming from the rash.  Your child who is younger than 3 months has a temperature of 100F (38C) or higher.   This information is not intended to replace advice given to you by your health care provider. Make sure you discuss any questions you have with your health care provider.   Document Released: 10/23/2009 Document Revised: 07/26/2015 Document Reviewed: 04/12/2015 Elsevier Interactive Patient Education [employer].

## 2015-10-03 ENCOUNTER — Encounter: Payer: Self-pay | Admitting: Student

## 2015-10-13 ENCOUNTER — Telehealth: Payer: Self-pay | Admitting: Family Medicine

## 2015-10-13 MED ORDER — TERBINAFINE HCL 250 MG PO TABS
250.0000 mg | ORAL_TABLET | Freq: Every day | ORAL | Status: AC
Start: 2015-10-13 — End: 2015-10-26

## 2015-10-13 NOTE — Telephone Encounter (Signed)
Patient contacted clinic having nausea with griseofulvin use x 4 days stopped medications and symptoms resolved started topical antifungal on head again and no improvement in rash on scalp.  Wants an alternative therapy prescribed.  Discussed lamisil 250mg  po daily x 2 weeks.  Follow up with PCM if no improvement or worsening of condition for re-evaluation consider dermatology referral.  Patient verbalized understanding of information/instructions, agreed with plan of care and had no further questions at this time.

## 2015-11-07 ENCOUNTER — Telehealth: Payer: Self-pay | Admitting: Family Medicine

## 2015-11-07 NOTE — Telephone Encounter (Signed)
Attempted to reach patient as requested refill of medication could not tolerate griseofulvin and put on lamisil 2 week course.  If still symptomatic needs follow up evaluation by a provider to determine if continue current treatment or alternative required.

## 2015-11-21 ENCOUNTER — Telehealth: Payer: Self-pay | Admitting: Family Medicine

## 2015-11-21 NOTE — Telephone Encounter (Signed)
Telephone message left for patient lamisil refill requires follow up for labs and to check improvement of scalp lesion.  Fax sent to [facility] also annotating follow up required.

## 2016-03-21 ENCOUNTER — Other Ambulatory Visit (HOSPITAL_COMMUNITY)
Admission: RE | Admit: 2016-03-21 | Discharge: 2016-03-21 | Disposition: A | Discharge status: Home / Self Care | Payer: BLUE CROSS/BLUE SHIELD | Source: Ambulatory Visit | Attending: Internal Medicine | Admitting: Internal Medicine

## 2016-03-21 ENCOUNTER — Ambulatory Visit (INDEPENDENT_AMBULATORY_CARE_PROVIDER_SITE_OTHER): Payer: BLUE CROSS/BLUE SHIELD | Admitting: Internal Medicine

## 2016-03-21 ENCOUNTER — Encounter: Payer: Self-pay | Admitting: Internal Medicine

## 2016-03-21 VITALS — BP 177/68 | HR 62 | Temp 98.1°F | Ht 59.5 in | Wt 228.9 lb

## 2016-03-21 DIAGNOSIS — D229 Melanocytic nevi, unspecified: Secondary | ICD-10-CM

## 2016-03-21 DIAGNOSIS — D225 Melanocytic nevi of trunk: Secondary | ICD-10-CM

## 2016-03-21 DIAGNOSIS — I10 Essential (primary) hypertension: Secondary | ICD-10-CM | POA: Diagnosis not present

## 2016-03-21 DIAGNOSIS — Z66 Do not resuscitate: Secondary | ICD-10-CM

## 2016-03-21 DIAGNOSIS — L989 Disorder of the skin and subcutaneous tissue, unspecified: Secondary | ICD-10-CM | POA: Diagnosis not present

## 2016-03-21 DIAGNOSIS — B079 Viral wart, unspecified: Secondary | ICD-10-CM | POA: Diagnosis not present

## 2016-03-21 DIAGNOSIS — R238 Other skin changes: Secondary | ICD-10-CM

## 2016-03-21 DIAGNOSIS — C44310 Basal cell carcinoma of skin of unspecified parts of face: Secondary | ICD-10-CM | POA: Insufficient documentation

## 2016-03-21 DIAGNOSIS — B078 Other viral warts: Secondary | ICD-10-CM

## 2016-03-21 DIAGNOSIS — C44319 Basal cell carcinoma of skin of other parts of face: Secondary | ICD-10-CM | POA: Diagnosis not present

## 2016-03-21 MED ORDER — HYDROCHLOROTHIAZIDE 25 MG PO TABS: 25.0000 mg | ORAL_TABLET | Freq: Every day | ORAL | Status: DC

## 2016-03-21 NOTE — Assessment & Plan Note (Addendum)
She has an large verrucous plaque on her posterior scalp that I believe is most likely either a common wart or seborrheic keratosis, but the lesion is atypical because it is not well-demarcated and oddly improved with oral terbinafine. This does not appear to be tinea capitis nor kerion, and an in-office KOH did not show fungal hyphae. 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 She also has very thin hair and extensive sun exposure, so squamous cell carcinoma is another thought. I've punch biopsied this today to clear it up.  Addendum: Biopsy showed this was indeed a simple common wart and fortunately nothing more. We can offer a deep freeze at the next visit.

## 2016-03-21 NOTE — Progress Notes (Signed)
Patient ID: Jennifer [name], female   DOB: 1951/10/29, 65 y.o.   MRN: [id number] [facility] Subjective:   Patient ID: Jennifer [name] female   DOB: June 01, 1951 65 y.o.   MRN: [id number]  HPI: Jennifer [name] is a very friendly 65 y.o. female with hypertension here for hypertension follow-up and evaluation of two skin lesions.  Hypertension: She hasn't been taking her medications for the last few months. Instead she's been taking her husband's ACE inhibitor and amlodipine. She's unsure of the doses, and she hasn't been checking her blood pressures at home.  Left temple papule: She's had a small bump on her left anterior temple that does not bleed. She's had a lot of sun exposure over the years.  Posterior scalp verrucous papule: For the last 3 years, she's had a scaly bump on the back of her head that sometimes bleeds and is bothersome. She was told this was a fungal infection; it improved slightly with oral terbinafine. She'd like to have a definitive answer.  She does not smoke and I have reviewed her medications with her today.  Review of Systems  Constitutional: Negative for fever, chills, weight loss and malaise/fatigue.  Respiratory: Negative for shortness of breath.   Cardiovascular: Negative for chest pain and leg swelling.  Skin: Positive for rash.  Neurological: Negative for dizziness and headaches.   Objective:  Physical Exam: Filed Vitals:   03/21/16 1055  BP: 177/68  Pulse: 62  Temp: 98.1 F (36.7 C)  TempSrc: Oral  Height: 4' 11.5" (1.511 m)  Weight: 228 lb 14.4 oz (103.828 kg)  SpO2: 97%   General: very friendly lady resting in bed comfortably, appropriately conversational Cardiac: regular rate and rhythm, no rubs, murmurs or gallops Pulm: breathing well, clear to auscultation bilaterally  Abd: bowel sounds normal, soft, nondistended, non-tender Ext: warm and well perfused, without pedal edema Lymph: no cervical or supraclavicular  lymphadenopathy Skin: 1. On the posterior scalp, there is an irregular 2x3cm verrucous plaque with dried blood 2. On the left anterior hairline, there is a 3mm pearly papule with subtle telangiectasis Neuro: alert and oriented X3, cranial nerves II-XII grossly intact, moving all extremities well  Left posterior scalp punch biopsy: After obtaining informed consent, the area was prepped and draped in the usual fashion.  Anesthesia was obtained with 1% lidocaine with epinephrine. A full thickness punch biopsy was obtained with a 4mm punch. The wound closed with one simple interrupted suture of 4-0 Ethilon. Sutures will be removed in 2 weeks. Wound care was discussed and the specimen was sent for dermatopathology.  Left anterior hairline shave biopsy:  After obtaining informed consent, the area was prepped and draped in the usual fashion. Anesthesia was obtained with 1% lidocaine with epinephrine. The superficial layers of the lesion were removed by shave biopsy. Hemostasis obtained with pressure and aluminum chloride. Wound care was discussed, and the specimen was sent for dermatopathology.  Assessment & Plan:  Case discussed with Dr. Heide Spark  Hypertension She was hypertensive today to 170/70 on her husband's unknown medications. We'll start her an antihypertensive regimen from scratch by starting hydrochlorothiazide 25mg  daily today. We'll check her blood pressure in 2 weeks. If she remains above goal of 140/90, I think adding lisinopril 20mg  daily would be a good addition.  Verrucous skin lesion She has an large verrucous plaque on her posterior scalp that I believe is most likely either a common wart or seborrheic keratosis, but the lesion is atypical because it  is not well-demarcated and oddly improved with oral terbinafine. This does not appear to be tinea capitis nor kerion, and an in-office KOH did not show fungal hyphae. 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 She also has very thin hair and extensive sun exposure, so squamous cell carcinoma is another thought. I've punch biopsied this today to clear it up.  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 I've shave biopsied this today. If positive, I'll refer her to Mohs for definitive excision.  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 I didn't see any ugly ducking lesions today that I felt warranted excisional biopsy. We'll set her up with [facility] clinic if possible for a more thorough evaluation.  DNR (do not resuscitate) She would like to be DNR because she's seen elderly people pass away. We discussed what this meant and she confirmed she would like this. We filled out a DNR form today.   Medications Ordered Meds ordered this encounter  Medications  . hydrochlorothiazide (HYDRODIURIL) 25 MG tablet    Sig: Take 1 tablet (25 mg total) by mouth daily.    Dispense:  90 tablet    Refill:  3   Other Orders Orders Placed This Encounter  Procedures  . Ambulatory referral to Dermatology    Referral Priority:  Routine    Referral Type:  Consultation    Referral Reason:  Specialty Services Required    Requested Specialty:  Dermatology    Number of Visits Requested:  1   Follow Up: Return in about 2 weeks (around 04/04/2016) for suture removal and blood pressure re-check.

## 2016-03-21 NOTE — Assessment & Plan Note (Signed)
She was hypertensive today to 170/70 on her husband's unknown medications. We'll start her an antihypertensive regimen from scratch by starting hydrochlorothiazide 25mg  daily today. We'll check her blood pressure in 2 weeks. If she remains above goal of 140/90, I think adding lisinopril 20mg  daily would be a good addition.

## 2016-03-21 NOTE — Progress Notes (Signed)
Internal Medicine Clinic Attending  Case discussed with Dr. Flores at the time of the visit.  We reviewed the resident's history and exam and pertinent patient test results.  I agree with the assessment, diagnosis, and plan of care documented in the resident's note. 

## 2016-03-21 NOTE — Assessment & Plan Note (Signed)
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 I didn't see any ugly ducking lesions today that I felt warranted excisional biopsy. We'll set her up with [facility] clinic if possible for a more thorough evaluation.

## 2016-03-21 NOTE — Patient Instructions (Signed)
[name],  It was a pleasure meeting you today!  I'll call you with the results of your biopsies.  We've started a new blood pressure medication and I'll see you in 2 weeks to re-check your blood pressure.  Take care, Dr. Melburn Hake

## 2016-03-21 NOTE — Assessment & Plan Note (Signed)
She would like to be DNR because she's seen elderly people pass away. We discussed what this meant and she confirmed she would like this. We filled out a DNR form today.

## 2016-03-21 NOTE — Assessment & Plan Note (Addendum)
She has a pink pearly papule on her left anterior forehead that clinically appears to be a basal cell carcinoma. I've shave biopsied this today. If positive, I'll refer her to Mohs for definitive excision.  Addendum: Biopsy showed this was indeed a basal cell; I've referred her to Mohs for excision.

## 2016-03-25 NOTE — Addendum Note (Signed)
Addended by: Carlota Raspberry on: 03/25/2016 12:02 PM   Modules accepted: Orders

## 2016-03-28 ENCOUNTER — Encounter: Payer: Self-pay | Admitting: Internal Medicine

## 2016-03-28 ENCOUNTER — Ambulatory Visit (INDEPENDENT_AMBULATORY_CARE_PROVIDER_SITE_OTHER): Payer: BLUE CROSS/BLUE SHIELD | Admitting: Internal Medicine

## 2016-03-28 VITALS — BP 176/66 | HR 84 | Temp 98.4°F | Wt 229.0 lb

## 2016-03-28 DIAGNOSIS — B079 Viral wart, unspecified: Secondary | ICD-10-CM | POA: Diagnosis not present

## 2016-03-28 DIAGNOSIS — C44310 Basal cell carcinoma of skin of unspecified parts of face: Secondary | ICD-10-CM

## 2016-03-28 DIAGNOSIS — I10 Essential (primary) hypertension: Secondary | ICD-10-CM | POA: Diagnosis not present

## 2016-03-28 DIAGNOSIS — C44319 Basal cell carcinoma of skin of other parts of face: Secondary | ICD-10-CM | POA: Diagnosis not present

## 2016-03-28 DIAGNOSIS — B078 Other viral warts: Secondary | ICD-10-CM

## 2016-03-28 MED ORDER — LISINOPRIL 10 MG PO TABS: 10.0000 mg | ORAL_TABLET | Freq: Every day | ORAL | Status: DC

## 2016-03-28 NOTE — Assessment & Plan Note (Addendum)
I was concerned this verrucous plaque could've been something strange like a squamous cell carcinoma or less likely blastomycosis; fortunate, a punch biopsy proved this was a simple verruca. Today, I performed a deep freeze and removed her sutures from the biopsy site. She consented to the procedure, there were no complications, and the attending physician was in the room for the procedure. I'm hoping we will get an adequate blister to induce an immune response to facilitate clearance. I'll see her again in one week for another freeze cycle.

## 2016-03-28 NOTE — Progress Notes (Addendum)
Patient ID: Jennifer [name], female   DOB: February 17, 1951, 65 y.o.   MRN: [id number] [facility] Subjective:   Patient ID: Jennifer [name] female   DOB: 01-Apr-1951 65 y.o.   MRN: [id number]  HPI: Ms.Faylynn Elmon Kirschner is a 65 y.o. female with hypertension here for follow-up of hypertension and evaluation of a wart.  Hypertension: She has been taking HCTZ 25mg  daily as prescribed but has not been checking her pressures at home.  Wart: She has a wart on the scalp of her head that becomes irritated and is bothersome; she would like to have frozen off today.  She is not smoking and I have reviewed her medications with her today.  Review of Systems  Constitutional: Negative for fever and chills.  Cardiovascular: Negative for chest pain.  Skin: Positive for rash. Negative for itching.  Neurological: Negative for dizziness.    Objective:  Physical Exam: Filed Vitals:   03/28/16 1058  BP: 176/66  Pulse: 84  Temp: 98.4 F (36.9 C)  TempSrc: Oral  Weight: 229 lb (103.874 kg)  SpO2: 97%   General: very friendly lady resting in chair comfortably, appropriately conversational Cardiac: regular rate and rhythm, no rubs, murmurs or gallops Pulm: breathing well, clear to auscultation bilaterally Ext: warm and well perfused, without pedal edema Skin:  1. Well-healing wound on left upper temple from shave biopsy last week 2. Verrucous plaque on posterior scalp with well-healed punch biopsy site Neuro: alert and oriented X3, cranial nerves II-XII grossly intact, moving all extremities well  Assessment & Plan:  Case discussed with Dr. Dareen Piano  Hypertension She continues to be hypertensive today at 176/66 on HCTZ 25 mg daily. I have added lisinopril 10 mg daily today. At the next visit, she remains to be above goal of 140/90, we can increase the lisinopril dose to 20 mg daily.  Verrucae vulgaris I was concerned this verrucous plaque could've been something strange like a  squamous cell carcinoma or less likely blastomycosis; fortunate, a punch biopsy proved this was a simple verruca. Today, I performed a deep freeze and removed her sutures from the biopsy site. She consented to the procedure, there were no complications, and the attending physician was in the room for the procedure. I'm hoping we will get an adequate blister to induce an immune response to facilitate clearance. I'll see her again in one week for another freeze cycle.  Basal cell carcinoma of face At the last visit, I biopsied a pearly pink nodule on her left upper temple; this shows this was indeed a basal cell carcinoma. I've referred her to Mohs surgery for definitive excision.   Medications Ordered Meds ordered this encounter  Medications  . lisinopril (PRINIVIL,ZESTRIL) 10 MG tablet    Sig: Take 1 tablet (10 mg total) by mouth daily.    Dispense:  90 tablet    Refill:  3   Follow Up: Return in about 11 days (around 04/08/2016) for at 145 with Dr. Melburn Hake for blood pressure follow-up and wart freeze.

## 2016-03-28 NOTE — Progress Notes (Signed)
Internal Medicine Clinic Attending  I saw and evaluated the patient.  I personally confirmed the key portions of the history and exam documented by Dr. Flores and I reviewed pertinent patient test results.  The assessment, diagnosis, and plan were formulated together and I agree with the documentation in the resident's note.  

## 2016-03-28 NOTE — Assessment & Plan Note (Signed)
She continues to be hypertensive today at 176/66 on HCTZ 25 mg daily. I have added lisinopril 10 mg daily today. At the next visit, she remains to be above goal of 140/90, we can increase the lisinopril dose to 20 mg daily.

## 2016-03-28 NOTE — Assessment & Plan Note (Signed)
At the last visit, I biopsied a pearly pink nodule on her left upper temple; this shows this was indeed a basal cell carcinoma. I've referred her to Mohs surgery for definitive excision.

## 2016-03-29 ENCOUNTER — Encounter: Payer: Self-pay | Admitting: Internal Medicine

## 2016-03-29 NOTE — Telephone Encounter (Signed)
Pt emails and states she

## 2016-04-01 ENCOUNTER — Encounter: Payer: BLUE CROSS/BLUE SHIELD | Admitting: Internal Medicine

## 2016-04-05 ENCOUNTER — Telehealth: Payer: Self-pay | Admitting: Internal Medicine

## 2016-04-05 NOTE — Telephone Encounter (Signed)
APT. REMINDER CALL, LMTCB °

## 2016-04-08 ENCOUNTER — Encounter: Payer: Self-pay | Admitting: Internal Medicine

## 2016-04-08 ENCOUNTER — Ambulatory Visit (INDEPENDENT_AMBULATORY_CARE_PROVIDER_SITE_OTHER): Payer: BLUE CROSS/BLUE SHIELD | Admitting: Internal Medicine

## 2016-04-08 DIAGNOSIS — I10 Essential (primary) hypertension: Secondary | ICD-10-CM

## 2016-04-08 DIAGNOSIS — Z6841 Body Mass Index (BMI) 40.0 and over, adult: Secondary | ICD-10-CM

## 2016-04-08 DIAGNOSIS — C44310 Basal cell carcinoma of skin of unspecified parts of face: Secondary | ICD-10-CM | POA: Diagnosis not present

## 2016-04-08 MED ORDER — ATENOLOL 50 MG PO TABS: 50.0000 mg | ORAL_TABLET | Freq: Every day | ORAL | Status: DC

## 2016-04-08 MED ORDER — AMLODIPINE BESYLATE 5 MG PO TABS
5.0000 mg | ORAL_TABLET | Freq: Every day | ORAL | Status: DC
Start: 2016-04-08 — End: 2018-01-30

## 2016-04-08 NOTE — Assessment & Plan Note (Signed)
She continues to be hypertensive today at 160/80. 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 This is quite b1-selective, so she's agreed to starting amlodipine 5mg  daily as well. At the next visit, if she is above-goal of 140/90, we can consider increasing the amlodipine to 10mg  daily.

## 2016-04-08 NOTE — Assessment & Plan Note (Signed)
Her basal cell had clear margins after the shave-biopsy so no further excision was needed. The wound has healed and she has no other suspicious lesions on her face. I re-emphasized the importance of sunscreen of SPF 30.

## 2016-04-08 NOTE — Progress Notes (Signed)
Patient ID: Jennifer [name], female   DOB: Apr 25, 1951, 65 y.o.   MRN: [id number] [facility] Subjective:   Patient ID: Jennifer [name] female   DOB: 21-Mar-1951 65 y.o.   MRN: [id number]  HPI: [name] is a 65 y.o. female with a hypertension and basal cell carcinoma here for follow-up of both medical problems in addition to weight loss.  Hypertension: She has only been taking HCTZ 25mg  daily; she stopped the lisinopril 10mg  daily because it made her feel "jittery," but she denied palpitations nor lightheadedness. She's requesting to start taking atenolol again because it helped calm her nerves when she was grooming dogs.  Basal cell carcinoma: She saw Dermatology who notified her that the shave biopsy had clear margins so Mohs excision was not necessary.  Weight loss: Her BMI is 40 and she would like to lose weight. She is walking about 15 minutes twice weekly, does not drink sodas, and "knows what to eat" after going to [employer]. She had succesfully lost 50 pounds in the past but put this weight back on after her cholecystectomy. She'd interested in bariatric surgery and would like to get more information.  She is not smoking and I have reviewed her medications with her today.  Review of Systems  Constitutional: Negative for fever and chills.  Respiratory: Negative for shortness of breath.   Cardiovascular: Negative for chest pain and leg swelling.  Musculoskeletal: Negative for myalgias and falls.  Skin: Negative for rash.  Neurological: Negative for dizziness, loss of consciousness and headaches.  Psychiatric/Behavioral: Negative for depression and substance abuse. The patient is nervous/anxious.     Objective:  Physical Exam: Filed Vitals:   04/08/16 1338  BP: 159/78  Pulse: 71  Temp: 97.8 F (36.6 C)  TempSrc: Oral  Height: 4\' 11"  (1.499 m)  Weight: 230 lb 8 oz (104.554 kg)  SpO2: 99%   General: friendly lady resting in chair  comfortably, appropriately conversational, joking around HEENT: no scleral icterus, extra-ocular muscles intact, oropharynx without lesions Cardiac: regular rate and rhythm, no rubs, murmurs or gallops Pulm: breathing well, clear to auscultation bilaterally Abd: bowel sounds normal, soft, nondistended, non-tender Ext: warm and well perfused, without pedal edema Lymph: no cervical or supraclavicular lymphadenopathy Skin: no rash, hair, or nail changes Neuro: alert and oriented X3, cranial nerves II-XII grossly intact, moving all extremities well  Assessment & Plan:  Case discussed with Dr. Oswaldo Done  Hypertension She continues to be hypertensive today at 160/80. 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 This is quite b1-selective, so she's agreed to starting amlodipine 5mg  daily as well. At the next visit, if she is above-goal of 140/90, we can consider increasing the amlodipine to 10mg  daily.  Basal cell carcinoma of face Her basal cell had clear margins after the shave-biopsy so no further excision was needed. The wound has healed and she has no other suspicious lesions on her face. I re-emphasized the importance of sunscreen of SPF 30.  Severe obesity (BMI >= 40) She likely qualifies for weight loss surgery given her BMI is greater than 40 so I have provided her information today. In the meantime, she's agreed to try to walk 30 minutes daily 5 days a week. Her diet seems to be quite healthy. We'll address this at the next visit.   Medications Ordered Meds ordered this encounter  Medications  . atenolol (TENORMIN) 50 MG tablet  Sig: Take 1 tablet (50 mg total) by mouth daily.    Dispense:  90 tablet    Refill:  3  . amLODipine (NORVASC) 5 MG tablet    Sig: Take 1 tablet (5 mg total) by mouth daily.    Dispense:  90 tablet    Refill:  3  . doxycycline (VIBRAMYCIN) 100 MG capsule    Sig:      Refill:  0   Follow Up: Return in about 4 weeks (around 05/06/2016) for follow-up blood pressure.

## 2016-04-08 NOTE — Assessment & Plan Note (Signed)
She likely qualifies for weight loss surgery given her BMI is greater than 40 so I have provided her information today. In the meantime, she's agreed to try to walk 30 minutes daily 5 days a week. Her diet seems to be quite healthy. We'll address this at the next visit.

## 2016-04-10 NOTE — Progress Notes (Signed)
Internal Medicine Clinic Attending  Case discussed with Dr. Flores at the time of the visit.  We reviewed the resident's history and exam and pertinent patient test results.  I agree with the assessment, diagnosis, and plan of care documented in the resident's note. 

## 2016-04-25 ENCOUNTER — Other Ambulatory Visit: Payer: Self-pay | Admitting: Internal Medicine

## 2016-04-25 NOTE — Addendum Note (Signed)
Addended by: Hulan Fray on: 04/25/2016 05:42 PM   Modules accepted: Orders

## 2016-07-01 ENCOUNTER — Other Ambulatory Visit: Payer: Self-pay

## 2016-07-01 NOTE — Telephone Encounter (Signed)
Requesting HCTZ to be filled @ [facility] on [address].

## 2016-07-01 NOTE — Telephone Encounter (Signed)
Pt has refills at [place]- confirmed, pt aware

## 2016-12-06 DIAGNOSIS — Z23 Encounter for immunization: Secondary | ICD-10-CM | POA: Diagnosis not present

## 2016-12-25 ENCOUNTER — Encounter: Payer: BLUE CROSS/BLUE SHIELD | Admitting: Internal Medicine

## 2017-01-26 ENCOUNTER — Other Ambulatory Visit: Payer: Self-pay | Admitting: Internal Medicine

## 2017-01-26 DIAGNOSIS — I10 Essential (primary) hypertension: Secondary | ICD-10-CM

## 2017-03-04 ENCOUNTER — Other Ambulatory Visit: Payer: Self-pay | Admitting: *Deleted

## 2017-03-04 DIAGNOSIS — I10 Essential (primary) hypertension: Secondary | ICD-10-CM

## 2017-03-04 MED ORDER — HYDROCHLOROTHIAZIDE 25 MG PO TABS: 25.0000 mg | ORAL_TABLET | Freq: Every day | ORAL | 0 refills | Status: DC

## 2017-03-12 ENCOUNTER — Encounter: Payer: BLUE CROSS/BLUE SHIELD | Admitting: Internal Medicine

## 2017-03-25 ENCOUNTER — Other Ambulatory Visit: Payer: Self-pay | Admitting: Internal Medicine

## 2017-03-25 DIAGNOSIS — I10 Essential (primary) hypertension: Secondary | ICD-10-CM

## 2017-03-25 NOTE — Telephone Encounter (Signed)
Call made to patient-no answer, left message on recorder to schedule an appt to be seen.[name] [name]5/8/20189:23 AM

## 2017-04-09 ENCOUNTER — Other Ambulatory Visit: Payer: Self-pay | Admitting: Internal Medicine

## 2017-04-14 ENCOUNTER — Other Ambulatory Visit: Payer: Self-pay | Admitting: Internal Medicine

## 2017-05-14 ENCOUNTER — Other Ambulatory Visit: Payer: Self-pay | Admitting: Internal Medicine

## 2017-05-14 ENCOUNTER — Telehealth: Payer: Self-pay | Admitting: Internal Medicine

## 2017-05-14 DIAGNOSIS — I10 Essential (primary) hypertension: Secondary | ICD-10-CM

## 2017-05-14 NOTE — Telephone Encounter (Signed)
CALLED PATIENT AT NURSE REQUEST, PATIENT CAN NOT AFFORD COPAY FOR DOCTOR APPT HERE. AFTER TALKING WITH PATIENT ADVISED HER TO GO APPLY FOR MEDICAID, FOOD STAMPS, AND LEAP PROGRAM. PATIENT INCOME IS LOW AND SHE WOULD RECEIVE THIS ASSISTANCE. PATIENT ALSO STATED SHE WAS GOING TO URGENT CARE IN HER AREA FOR HER REFILLS.

## 2017-05-14 NOTE — Telephone Encounter (Signed)
p is not very cooperative or agreeable, ask to schedule an appt, offered for tomorrow, has been more than 1 year, she informed me she was not a drug addict and she should get her refills because she knows what she needs and doesn't need to see a doctor to know, "just give me my refills, I dont have money to throw away, informed her the [facility] would gladly try to assist her and will speak w/ deborahh. About her financial situation also w/ margaretj. And dr Maudie Mercury, she states she has transportation of her own and has no problem getting to where she wants to go but cannot afford office visits. Informed her triage will call her back and assist her.

## 2017-05-14 NOTE — Telephone Encounter (Signed)
Patient needs appt before refills, spoke to family member & asked for patient to give [place] a call when available.

## 2017-05-29 ENCOUNTER — Encounter: Payer: Self-pay | Admitting: Family Medicine

## 2017-05-29 ENCOUNTER — Other Ambulatory Visit: Payer: Self-pay | Admitting: Family Medicine

## 2017-05-29 ENCOUNTER — Ambulatory Visit (INDEPENDENT_AMBULATORY_CARE_PROVIDER_SITE_OTHER): Payer: Medicare Other | Admitting: Family Medicine

## 2017-05-29 VITALS — BP 150/70 | HR 62 | Temp 97.8°F | Ht 59.0 in | Wt 224.5 lb

## 2017-05-29 DIAGNOSIS — Z1159 Encounter for screening for other viral diseases: Secondary | ICD-10-CM | POA: Diagnosis not present

## 2017-05-29 DIAGNOSIS — E785 Hyperlipidemia, unspecified: Secondary | ICD-10-CM | POA: Insufficient documentation

## 2017-05-29 DIAGNOSIS — Z1239 Encounter for other screening for malignant neoplasm of breast: Secondary | ICD-10-CM

## 2017-05-29 DIAGNOSIS — Z862 Personal history of diseases of the blood and blood-forming organs and certain disorders involving the immune mechanism: Secondary | ICD-10-CM

## 2017-05-29 DIAGNOSIS — R739 Hyperglycemia, unspecified: Secondary | ICD-10-CM | POA: Diagnosis not present

## 2017-05-29 DIAGNOSIS — Z1231 Encounter for screening mammogram for malignant neoplasm of breast: Secondary | ICD-10-CM | POA: Diagnosis not present

## 2017-05-29 DIAGNOSIS — L918 Other hypertrophic disorders of the skin: Secondary | ICD-10-CM | POA: Diagnosis not present

## 2017-05-29 DIAGNOSIS — I10 Essential (primary) hypertension: Secondary | ICD-10-CM

## 2017-05-29 DIAGNOSIS — E2839 Other primary ovarian failure: Secondary | ICD-10-CM

## 2017-05-29 LAB — COMPREHENSIVE METABOLIC PANEL
ALBUMIN: 4.1 g/dL (ref 3.5–5.2)
ALT: 48 U/L — AB (ref 0–35)
AST: 40 U/L — AB (ref 0–37)
Alkaline Phosphatase: 57 U/L (ref 39–117)
BILIRUBIN TOTAL: 0.5 mg/dL (ref 0.2–1.2)
BUN: 14 mg/dL (ref 6–23)
CALCIUM: 9.7 mg/dL (ref 8.4–10.5)
CHLORIDE: 95 meq/L — AB (ref 96–112)
CO2: 34 mEq/L — ABNORMAL HIGH (ref 19–32)
CREATININE: 0.84 mg/dL (ref 0.40–1.20)
GFR: 72.15 mL/min (ref >60.00)
Glucose, Bld: 169 mg/dL — ABNORMAL HIGH (ref 70–99)
Potassium: 2.9 mEq/L — ABNORMAL LOW (ref 3.5–5.1)
SODIUM: 137 meq/L (ref 135–145)
TOTAL PROTEIN: 7.9 g/dL (ref 6.0–8.3)

## 2017-05-29 LAB — CBC
HCT: 42 % (ref 36.0–46.0)
HEMOGLOBIN: 14.6 g/dL (ref 12.0–15.0)
MCHC: 34.8 g/dL (ref 30.0–36.0)
MCV: 91.1 fl (ref 78.0–100.0)
PLATELETS: 258 10*3/uL (ref 150.0–400.0)
RBC: 4.61 Mil/uL (ref 3.87–5.11)
RDW: 13.9 % (ref 11.5–15.5)
WBC: 6.2 10*3/uL (ref 4.0–10.5)

## 2017-05-29 LAB — LIPID PANEL
CHOLESTEROL: 215 mg/dL — AB (ref 0–200)
HDL: 53.9 mg/dL (ref >39.00)
LDL CALC: 137 mg/dL — AB (ref 0–99)
NonHDL: 161.12
TRIGLYCERIDES: 121 mg/dL (ref 0.0–149.0)
Total CHOL/HDL Ratio: 4
VLDL: 24.2 mg/dL (ref 0.0–40.0)

## 2017-05-29 LAB — HEMOGLOBIN A1C: Hgb A1c MFr Bld: 6.5 % (ref 4.6–6.5)

## 2017-05-29 MED ORDER — ATENOLOL 50 MG PO TABS: 50.0000 mg | ORAL_TABLET | Freq: Every day | ORAL | 3 refills | Status: DC

## 2017-05-29 MED ORDER — POTASSIUM CHLORIDE CRYS ER 20 MEQ PO TBCR: 20.0000 meq | EXTENDED_RELEASE_TABLET | Freq: Two times a day (BID) | ORAL | 0 refills | Status: DC

## 2017-05-29 MED ORDER — HYDROCHLOROTHIAZIDE 25 MG PO TABS: 25.0000 mg | ORAL_TABLET | Freq: Every day | ORAL | 3 refills | Status: DC

## 2017-05-29 NOTE — Patient Instructions (Signed)
We will call with the referrals.  We will also call your lab results.  Please be sure to get her mammogram.  Follow-up in 3 months regarding her blood pressure.  Take care  Dr. Lacinda Axon

## 2017-05-29 NOTE — Assessment & Plan Note (Signed)
New problem. Patient has numerous skin tags and seborrheic keratoses as well as nevi. Has a history of skin cancer. Referring to dermatology.

## 2017-05-29 NOTE — Assessment & Plan Note (Signed)
Ago. Needs weight loss. Continue amlodipine, HCTZ, atenolol. Refilled atenolol and HCTZ today. Labs today.

## 2017-05-29 NOTE — Progress Notes (Signed)
Subjective:  Patient ID: Jennifer [name], female    DOB: [birth year]  Age: 66 y.o. MRN: [id number]  CC: Establish care  HPI Jennifer [name] is a 66 y.o. female presents to the clinic today to establish care. Issues are below.  HTN  Blood pressure elevated today.  She states that she often misses doses of her medications.  She is currently on amlodipine 5 mg daily, atenolol 50 mg daily, and HCTZ 25 mg daily. She needs refills on her atenolol and HCTZ.  Needs labs today.  Hyperlipidemia  Uncontrolled and untreated.  Needs lipid panel today.  Skin tags/skin lesions  Patient states that she has several skin tags and moles/skin lesions around her neck.  They're quite troublesome and irritating.  She has a history of skin cancer as well.  She would like to see dermatology.  Preventative healthcare  Pap smear up-to-date.  In need of mammogram. Will order.  In need of bone density scan. Will order.  Needs colon cancer screening. Refuses colonoscopy. Okay with cologuard.  Hep C antibody today.  Declined HIV screening.  Declines immunizations.  PMH, Surgical Hx, Family Hx, Social History reviewed and updated as below.  Past Medical History:  Diagnosis Date  . History of skin cancer   . Hyperlipidemia   . Hypertension    Past Surgical History:  Procedure Laterality Date  . APPENDECTOMY    . CESAREAN SECTION    . CHOLECYSTECTOMY  11/19/2012   Procedure: LAPAROSCOPIC CHOLECYSTECTOMY WITH INTRAOPERATIVE CHOLANGIOGRAM;  Surgeon: Velora Heckler, MD;  Location: [facility] OR;  Service: General;  Laterality: N/A;  . I&D EXTREMITY  09/21/2012   Procedure: IRRIGATION AND DEBRIDEMENT EXTREMITY;  Surgeon: Nadara Mustard, MD;  Location: [facility] OR;  Service: Orthopedics;  Laterality: Left;  Excisional Debridement Left Thigh, apply wound vac  . KNEE SURGERY    . ORTHOPEDIC SURGERY     Family History  Problem Relation Age of Onset  . Hypertension Mother   . Heart disease Father     Social History  Substance Use Topics  . Smoking status: Never Smoker  . Smokeless tobacco: Never Used  . Alcohol use No   Review of Systems  Skin:       Skin tags/skin lesions.  All other systems reviewed and are negative.   Objective:   Today's Vitals: BP (!) 150/70 (BP Location: Left Arm, Patient Position: Sitting, Cuff Size: Large)   Pulse 62   Temp 97.8 F (36.6 C) (Oral)   Ht 4\' 11"  (1.499 m)   Wt 224 lb 8 oz (101.8 kg)   SpO2 97%   BMI 45.34 kg/m   Physical Exam  Constitutional: She is oriented to person, place, and time. She appears well-developed. No distress.  Morbidly obese.  HENT:  Head: Normocephalic and atraumatic.  Mouth/Throat: Oropharynx is clear and moist.  Poor dentition.  Eyes: Pupils are equal, round, and reactive to light. Conjunctivae are normal. No scleral icterus.  Neck: Neck supple. No thyromegaly present.  Cardiovascular: Normal rate and regular rhythm.   No murmur heard. Pulmonary/Chest: Effort normal and breath sounds normal. She has no wheezes. She has no rales.  Abdominal: Soft. She exhibits no distension. There is no tenderness.  Musculoskeletal: Normal range of motion.  Lymphadenopathy:    She has no cervical adenopathy.  Neurological: She is alert and oriented to person, place, and time.  Skin:  Numerous skin tags noted around the neck. Patient has seborrheic keratoses as well. Numerous nevi.  Psychiatric:  She has a normal mood and affect.  Vitals reviewed.  Assessment & Plan:   Problem List Items Addressed This Visit      Cardiovascular and Mediastinum   Essential hypertension - Primary    Ago. Needs weight loss. Continue amlodipine, HCTZ, atenolol. Refilled atenolol and HCTZ today. Labs today.      Relevant Medications   atenolol (TENORMIN) 50 MG tablet   hydrochlorothiazide (HYDRODIURIL) 25 MG tablet   Other Relevant Orders   Comprehensive metabolic panel     Musculoskeletal and Integument   Skin tags, multiple  acquired    New problem. Patient has numerous skin tags and seborrheic keratoses as well as nevi. Has a history of skin cancer. Referring to dermatology.      Relevant Orders   Ambulatory referral to Dermatology     Other   Hyperlipidemia    Uncontrolled. Lipid panel today.       Relevant Medications   atenolol (TENORMIN) 50 MG tablet   hydrochlorothiazide (HYDRODIURIL) 25 MG tablet   Other Relevant Orders   Lipid panel    Other Visit Diagnoses    Screening mammogram, encounter for       Morbid obesity (HCC)       Breast cancer screening       Relevant Orders   MM Digital Screening   Estrogen deficiency       Relevant Orders   DG BONE DENSITY (DXA)   Need for hepatitis C screening test       Relevant Orders   Hepatitis C Antibody   History of anemia       Relevant Orders   CBC   Blood glucose elevated       Relevant Orders   Hemoglobin A1c      Meds ordered this encounter  Medications  . atenolol (TENORMIN) 50 MG tablet    Sig: Take 1 tablet (50 mg total) by mouth daily.    Dispense:  90 tablet    Refill:  3  . hydrochlorothiazide (HYDRODIURIL) 25 MG tablet    Sig: Take 1 tablet (25 mg total) by mouth daily.    Dispense:  90 tablet    Refill:  3    Follow-up: Return in about 3 months (around 08/29/2017) for HTN follow up.  Everlene Other DO [facility]

## 2017-05-29 NOTE — Assessment & Plan Note (Signed)
Uncontrolled. Lipid panel today. 

## 2017-05-30 ENCOUNTER — Telehealth: Payer: Self-pay | Admitting: Family Medicine

## 2017-05-30 LAB — HEPATITIS C ANTIBODY: HCV Ab: NEGATIVE

## 2017-05-30 NOTE — Telephone Encounter (Signed)
Returned call patient advised of lab results ,see documentation under lab results.

## 2017-05-30 NOTE — Telephone Encounter (Signed)
Pt called back returning your call. Please advise, thank you!  Call pt @ [phone]

## 2017-06-03 ENCOUNTER — Telehealth: Payer: Self-pay | Admitting: Family Medicine

## 2017-06-03 NOTE — Telephone Encounter (Signed)
Gabby from [facility] called and stated that they need a new order put in for bone density. There was not DOB, dx code, or signature on the one they received. Please advise, thank you!  Fax @ [phone]

## 2017-06-03 NOTE — Telephone Encounter (Signed)
Order faxed to [facility] 

## 2017-06-05 DIAGNOSIS — L821 Other seborrheic keratosis: Secondary | ICD-10-CM | POA: Diagnosis not present

## 2017-06-05 DIAGNOSIS — L570 Actinic keratosis: Secondary | ICD-10-CM | POA: Diagnosis not present

## 2017-06-05 DIAGNOSIS — L918 Other hypertrophic disorders of the skin: Secondary | ICD-10-CM | POA: Diagnosis not present

## 2017-06-13 ENCOUNTER — Ambulatory Visit (INDEPENDENT_AMBULATORY_CARE_PROVIDER_SITE_OTHER): Payer: Medicare Other | Admitting: Family Medicine

## 2017-06-13 VITALS — BP 130/66 | HR 65 | Temp 98.2°F | Resp 12 | Wt 226.0 lb

## 2017-06-13 DIAGNOSIS — R7989 Other specified abnormal findings of blood chemistry: Secondary | ICD-10-CM

## 2017-06-13 DIAGNOSIS — E119 Type 2 diabetes mellitus without complications: Secondary | ICD-10-CM

## 2017-06-13 DIAGNOSIS — R945 Abnormal results of liver function studies: Secondary | ICD-10-CM

## 2017-06-13 DIAGNOSIS — E876 Hypokalemia: Secondary | ICD-10-CM | POA: Diagnosis not present

## 2017-06-13 NOTE — Patient Instructions (Signed)
Continue your meds.  Follow up in 3 months.  Take care  Dr. Rhys Anchondo  

## 2017-06-14 ENCOUNTER — Other Ambulatory Visit: Payer: Self-pay | Admitting: Family Medicine

## 2017-06-14 LAB — COMPREHENSIVE METABOLIC PANEL
ALT: 41 U/L — AB (ref 6–29)
AST: 32 U/L (ref 10–35)
Albumin: 3.9 g/dL (ref 3.6–5.1)
Alkaline Phosphatase: 65 U/L (ref 33–130)
BILIRUBIN TOTAL: 0.3 mg/dL (ref 0.2–1.2)
BUN: 16 mg/dL (ref 7–25)
CALCIUM: 9.1 mg/dL (ref 8.6–10.4)
CO2: 25 mmol/L (ref 20–31)
Chloride: 98 mmol/L (ref 98–110)
Creat: 1 mg/dL — ABNORMAL HIGH (ref 0.50–0.99)
Glucose, Bld: 96 mg/dL (ref 65–99)
Potassium: 3.3 mmol/L — ABNORMAL LOW (ref 3.5–5.3)
SODIUM: 139 mmol/L (ref 135–146)
Total Protein: 7.2 g/dL (ref 6.1–8.1)

## 2017-06-14 MED ORDER — POTASSIUM CHLORIDE CRYS ER 20 MEQ PO TBCR: 20.0000 meq | EXTENDED_RELEASE_TABLET | Freq: Two times a day (BID) | ORAL | 0 refills | Status: DC

## 2017-06-15 DIAGNOSIS — E876 Hypokalemia: Secondary | ICD-10-CM | POA: Insufficient documentation

## 2017-06-15 DIAGNOSIS — R7989 Other specified abnormal findings of blood chemistry: Secondary | ICD-10-CM | POA: Insufficient documentation

## 2017-06-15 DIAGNOSIS — R945 Abnormal results of liver function studies: Secondary | ICD-10-CM

## 2017-06-15 NOTE — Assessment & Plan Note (Signed)
New problem. Suspect Hepatic steatosis. CMP today.

## 2017-06-15 NOTE — Assessment & Plan Note (Signed)
New problem. New diagnosis. No pharmacotherapy at this time. Advised dietary changes and weight loss.

## 2017-06-15 NOTE — Assessment & Plan Note (Signed)
Lab today. Continue off HCTZ.

## 2017-06-15 NOTE — Progress Notes (Signed)
Subjective:  Patient ID: Jennifer [name], female    DOB: [birth year]  Age: 66 y.o. MRN: [id number]  CC: Follow up  HPI:  66 year old female with hypertension, hyperlipidemia, morbid obesity presents for follow-up.  Hypokalemia  Patient recently found to be hypokalemic. HCTZ was stopped. Her potassium was repleted orally.  She states that her blood pressure is well controlled despite being off HCTZ.  She feels well otherwise  She needs a metabolic panel today.  DM 2  New diagnosis.  A1c found to be 6.5.  We need to discuss treatment options regarding her diabetes today.  Elevated LFTs  New problem.  Discovered on Labs.  Needs repeat today.  Social Hx   Social History   Social History  . Marital status: Divorced    Spouse name: N/A  . Number of children: N/A  . Years of education: N/A   Social History Main Topics  . Smoking status: Never Smoker  . Smokeless tobacco: Never Used  . Alcohol use No  . Drug use: No  . Sexual activity: Yes    Birth control/ protection: Post-menopausal   Other Topics Concern  . Not on file   Social History Narrative  . No narrative on file    Review of Systems  Respiratory: Negative.   Cardiovascular: Negative.   Endocrine: Negative.    Objective:  BP 130/66 (BP Location: Left Arm, Patient Position: Sitting, Cuff Size: Large)   Pulse 65   Temp 98.2 F (36.8 C) (Oral)   Resp 12   Wt 226 lb (102.5 kg)   SpO2 98%   BMI 45.65 kg/m   BP/Weight 06/13/2017 05/29/2017 04/08/2016  Systolic BP 130 150 159  Diastolic BP 66 70 78  Wt. (Lbs) 226 224.5 230.5  BMI 45.65 45.34 46.53    Physical Exam  Constitutional: She is oriented to person, place, and time.  Morbidly obese female in NAD.   Cardiovascular: Normal rate and regular rhythm.   No murmur heard. Pulmonary/Chest: Effort normal. She has no wheezes. She has no rales.  Neurological: She is alert and oriented to person, place, and time.  Psychiatric: She has a  normal mood and affect.  Vitals reviewed.   Lab Results  Component Value Date   WBC 6.2 05/29/2017   HGB 14.6 05/29/2017   HCT 42.0 05/29/2017   PLT 258.0 05/29/2017   GLUCOSE 96 06/13/2017   CHOL 215 (H) 05/29/2017   TRIG 121.0 05/29/2017   HDL 53.90 05/29/2017   LDLCALC 137 (H) 05/29/2017   ALT 41 (H) 06/13/2017   AST 32 06/13/2017   NA 139 06/13/2017   K 3.3 (L) 06/13/2017   CL 98 06/13/2017   CREATININE 1.00 (H) 06/13/2017   BUN 16 06/13/2017   CO2 25 06/13/2017   INR 1.07 09/20/2012   HGBA1C 6.5 05/29/2017    Assessment & Plan:   Problem List Items Addressed This Visit      Endocrine   DM (diabetes mellitus), type 2 (HCC)    New problem. New diagnosis. No pharmacotherapy at this time. Advised dietary changes and weight loss.         Other   Elevated LFTs    New problem. Suspect Hepatic steatosis. CMP today.      Relevant Orders   Comprehensive metabolic panel (Completed)   Hypokalemia - Primary    Lab today. Continue off HCTZ.      Relevant Orders   Comprehensive metabolic panel (Completed)     Follow-up: 3 months  Everlene Other DO [facility]

## 2017-06-23 NOTE — Telephone Encounter (Signed)
See note from patient . Thanks.

## 2018-01-30 ENCOUNTER — Telehealth: Payer: Self-pay | Admitting: Family Medicine

## 2018-01-30 ENCOUNTER — Other Ambulatory Visit: Payer: Self-pay | Admitting: Internal Medicine

## 2018-01-30 DIAGNOSIS — I10 Essential (primary) hypertension: Secondary | ICD-10-CM

## 2018-01-30 MED ORDER — ATENOLOL 50 MG PO TABS: 50.0000 mg | ORAL_TABLET | Freq: Every day | ORAL | 0 refills | Status: DC

## 2018-01-30 MED ORDER — AMLODIPINE BESYLATE 5 MG PO TABS: 5.0000 mg | ORAL_TABLET | Freq: Every day | ORAL | 0 refills | Status: DC

## 2018-01-30 NOTE — Telephone Encounter (Signed)
Filled norvasc 5 mg qd and atenolol 50 mg qd  Next time doses of medication need to be clarified and frequency  And  Which pharmacy  Please clarify above is what she was taking   [place]

## 2018-01-30 NOTE — Telephone Encounter (Signed)
Jennifer [name], I spoke to Jennifer [name] who needs some medication refills but has not transferred to another provider since Dr. Lacinda Axon relocated. She is planning on transferring to Dr. Caryl Bis. She is scheduled to see Denisa 02/19/18 and has ran out of her BP medications. Could you please call pt and discuss? She would like to know if she can get refills until her appt with Dr. Caryl Bis is scheduled. I was unable to schedule that appt due to not knowing how Dr. Caryl Bis likes his transferred pt's scheduled.   Best number to reach pt is [phone]  Thank you, Ebony Hail

## 2018-01-30 NOTE — Telephone Encounter (Addendum)
Patient has an appointment to transfer care from Dr. Lacinda Axon with PCP on 02/26/18 , ok to fill BP medications til appointment.

## 2018-02-02 NOTE — Telephone Encounter (Signed)
Medications filled.  

## 2018-02-19 ENCOUNTER — Ambulatory Visit (INDEPENDENT_AMBULATORY_CARE_PROVIDER_SITE_OTHER): Payer: Medicare Other

## 2018-02-19 VITALS — BP 132/72 | HR 61 | Temp 98.3°F | Resp 15 | Ht 60.0 in | Wt 221.8 lb

## 2018-02-19 DIAGNOSIS — Z0000 Encounter for general adult medical examination without abnormal findings: Secondary | ICD-10-CM

## 2018-02-19 NOTE — Patient Instructions (Addendum)
  Jennifer [name] , Thank you for taking time to come for your Medicare Wellness Visit. I appreciate your ongoing commitment to your health goals. Please review the following plan we discussed and let me know if I can assist you in the future.   These are the goals we discussed: Goals    . Increase physical activity     Walk for exercise    . Low carb diet     Vegetables, fruits, lean meats.  Educational material provided.        This is a list of the screening recommended for you and due dates:  Health Maintenance  Topic Date Due  . Complete foot exam   08/14/1961  . Eye exam for diabetics  08/14/1961  . Urine Protein Check  08/14/1961  . Mammogram  08/14/2001  . Cologuard (Stool DNA test)  08/14/2001  . DEXA scan (bone density measurement)  08/14/2016  . Pneumonia vaccines (1 of 2 - PCV13) 08/14/2016  . Hemoglobin A1C  11/29/2017  . Flu Shot  06/18/2018  . Tetanus Vaccine  11/19/2019  .  Hepatitis C: One time screening is recommended by Center for Disease Control  (CDC) for  adults born from [birth year] through 1965.   Completed

## 2018-02-19 NOTE — Progress Notes (Signed)
Subjective:   JORIE ZEE is a 67 y.o. female who presents for an Initial Medicare Annual Wellness Visit.  Review of Systems    No ROS.  Medicare Wellness Visit. Additional risk factors are reflected in the social history.  Cardiac Risk Factors include: advanced age (>64men, >95 women);obesity (BMI >30kg/m2);diabetes mellitus;hypertension     Objective:    Today's Vitals   02/19/18 0912  BP: 132/72  Pulse: 61  Resp: 15  Temp: 98.3 F (36.8 C)  TempSrc: Oral  SpO2: 97%  Weight: 221 lb 12.8 oz (100.6 kg)  Height: 5' (1.524 m)   Body mass index is 43.32 kg/m.  Advanced Directives 02/19/2018 03/28/2016 03/21/2016 12/14/2014 11/19/2012 11/16/2012 09/23/2012  Does Patient Have a Medical Advance Directive? Yes No No No Patient does not have advance directive Patient does not have advance directive Patient does not have advance directive  Type of Advance Directive Out of facility DNR (pink MOST or yellow form) - - - - - -  Does patient want to make changes to medical advance directive? No - Patient declined - - - - - -  Would patient like information on creating a medical advance directive? - No - patient declined information Yes - Scientist, clinical (histocompatibility and immunogenetics) given Yes - Scientist, clinical (histocompatibility and immunogenetics) given - - -  Pre-existing out of facility DNR order (yellow form or pink MOST form) - - - - No No No    Current Medications (verified) Outpatient Encounter Medications as of 02/19/2018  Medication Sig  . amLODipine (NORVASC) 5 MG tablet Take 1 tablet (5 mg total) by mouth daily for 30 doses.  Marland Kitchen atenolol (TENORMIN) 50 MG tablet Take 1 tablet (50 mg total) by mouth daily.  Marland Kitchen co-enzyme Q-10 30 MG capsule Take 50 mg by mouth 2 (two) times daily.  . potassium chloride SA (K-DUR,KLOR-CON) 20 MEQ tablet Take 1 tablet (20 mEq total) by mouth 2 (two) times daily. For 3 days.   No facility-administered encounter medications on file as of 02/19/2018.     Allergies (verified) Morphine and related and Epinephrine    History: Past Medical History:  Diagnosis Date  . History of skin cancer   . Hyperlipidemia   . Hypertension    Past Surgical History:  Procedure Laterality Date  . APPENDECTOMY    . CESAREAN SECTION    . CHOLECYSTECTOMY  11/19/2012   Procedure: LAPAROSCOPIC CHOLECYSTECTOMY WITH INTRAOPERATIVE CHOLANGIOGRAM;  Surgeon: Earnstine Regal, MD;  Location: [facility];  Service: General;  Laterality: N/A;  . I&D EXTREMITY  09/21/2012   Procedure: IRRIGATION AND DEBRIDEMENT EXTREMITY;  Surgeon: Newt Minion, MD;  Location: [facility];  Service: Orthopedics;  Laterality: Left;  Excisional Debridement Left Thigh, apply wound vac  . KNEE SURGERY    . ORTHOPEDIC SURGERY     Family History  Problem Relation Age of Onset  . Hypertension Mother   . Heart disease Father   . Diabetes Sister   . Hypertension Sister   . Hypertension Son   . Hypertension Son    Social History   Socioeconomic History  . Marital status: Divorced    Spouse name: Not on file  . Number of children: Not on file  . Years of education: Not on file  . Highest education level: Not on file  Occupational History  . Not on file  Social Needs  . Financial resource strain: Not on file  . Food insecurity:    Worry: Never true    Inability: Never true  .  Transportation needs:    Medical: No    Non-medical: No  Tobacco Use  . Smoking status: Never Smoker  . Smokeless tobacco: Never Used  Substance and Sexual Activity  . Alcohol use: No    Alcohol/week: 0.0 oz  . Drug use: No  . Sexual activity: Not Currently    Birth control/protection: Post-menopausal  Lifestyle  . Physical activity:    Days per week: 0 days    Minutes per session: Not on file  . Stress: Only a little  Relationships  . Social connections:    Talks on phone: Not on file    Gets together: Not on file    Attends religious service: Not on file    Active member of club or organization: Not on file    Attends meetings of clubs or organizations: Not on  file    Relationship status: Not on file  Other Topics Concern  . Not on file  Social History Narrative  . Not on file    Tobacco Counseling Counseling given: Not Answered   Clinical Intake:  Pre-visit preparation completed: Yes  Pain : No/denies pain     Nutritional Status: BMI > 30  Obese Diabetes: Yes(Followed by PCP)  How often do you need to have someone help you when you read instructions, pamphlets, or other written materials from your doctor or pharmacy?: 1 - Never  Interpreter Needed?: No      Activities of Daily Living In your present state of health, do you have any difficulty performing the following activities: 02/19/2018  Hearing? N  Vision? N  Difficulty concentrating or making decisions? Y  Comment Difficulty with long-term memory  Walking or climbing stairs? N  Dressing or bathing? N  Doing errands, shopping? N  Preparing Food and eating ? N  Using the Toilet? N  In the past six months, have you accidently leaked urine? N  Do you have problems with loss of bowel control? N  Managing your Medications? N  Managing your Finances? N  Housekeeping or managing your Housekeeping? N  Some recent data might be hidden     Immunizations and Health Maintenance Immunization History  Administered Date(s) Administered  . Influenza Split 09/20/2012  . Influenza,inj,Quad PF,6+ Mos 12/23/2013   Health Maintenance Due  Topic Date Due  . FOOT EXAM  08/14/1961  . OPHTHALMOLOGY EXAM  08/14/1961  . URINE MICROALBUMIN  08/14/1961  . MAMMOGRAM  08/14/2001  . Fecal DNA (Cologuard)  08/14/2001  . DEXA SCAN  08/14/2016  . PNA vac Low Risk Adult (1 of 2 - PCV13) 08/14/2016  . HEMOGLOBIN A1C  11/29/2017    Patient Care Team: McLean-Scocuzza, Nino Glow, MD as PCP - General (Internal Medicine)  Indicate any recent Medical Services you may have received from other than [facility] providers in the past year (date may be approximate).     Assessment:   This is a routine  wellness examination for Ada.  The goal of the wellness visit is to assist the patient how to close the gaps in care and create a preventative care plan for the patient.   The roster of all physicians providing medical care to patient is listed in the Snapshot section of the chart.  Osteoporosis risk reviewed.    Safety issues reviewed; Smoke and carbon monoxide detectors in the home. No firearms or firearms locked in a safe within the home. Wears seatbelts when driving or riding with others. No violence in the home.  They do not  have excessive sun exposure.  Discussed the need for sun protection: hats, long sleeves and the use of sunscreen if there is significant sun exposure.  Patient is alert, normal appearance, oriented to person/place/and time.  Correctly identified the president of the [place] and recalls of 1/3 words. Performs simple calculations and can read correct time from watch face. Displays appropriate judgement.  No new identified risk were noted.  No failures at ADL's or IADL's.    BMI- discussed the importance of a healthy diet, water intake and the benefits of aerobic exercise. Educational material provided.   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  Eye- Visual acuity not assessed per patient preference since they have regular follow up with the ophthalmologist.  Wears corrective lenses.  Sleep patterns- Sleeps 6 hours at night.  Wakes feeling rested.  Prevnar 13 vaccine deferred per patient request.  Educational material provided.    Diabetes- Last A1c 6.5, diet controlled.  Cologuard discussed- states her last was 2 years ago.    TDAP vaccine deferred per patient preference.  Follow up with insurance.  Educational material provided.  Patient Concerns: None at this time. Follow up with PCP as needed.  Hearing/Vision screen Hearing Screening Comments: Patient is able to hear conversational tones without  difficulty.    Vision Screening Comments: Followed by [facility] in [place] Wears corrective lenses Last OV 2018 Visual acuity not assessed per patient preference since they have regular follow up with the ophthalmologist  Dietary issues and exercise activities discussed: Current Exercise Habits: The patient does not participate in regular exercise at present  Goals    . Increase physical activity     Walk for exercise    . Low carb diet     Vegetables, fruits, lean meats.  Educational material provided.       Depression Screen PHQ 2/9 Scores 02/19/2018 05/29/2017 04/08/2016 03/28/2016 03/21/2016 12/14/2014 12/23/2013  PHQ - 2 Score 0 0 0 0 0 0 0  PHQ- 9 Score - 3 - - - - -    Fall Risk Fall Risk  02/19/2018 05/29/2017 04/08/2016 03/28/2016 03/21/2016  Falls in the past year? No No No No No  Comment - - - - -  Number falls in past yr: - - - - -  Injury with Fall? - - - - -  Comment - - - - -  Risk for fall due to : - - - - -   MMSE - [place] Exam 02/19/2018  Orientation to time 5  Orientation to Place 5  Registration 3  Attention/ Calculation 5  Recall 1  Language- name 2 objects 2  Language- repeat 1  Language- follow 3 step command 3  Language- read & follow direction 1  Write a sentence 1  Copy design 1  Total score 28        Screening Tests Health Maintenance  Topic Date Due  . FOOT EXAM  08/14/1961  . OPHTHALMOLOGY EXAM  08/14/1961  . URINE MICROALBUMIN  08/14/1961  . MAMMOGRAM  08/14/2001  . Fecal DNA (Cologuard)  08/14/2001  . DEXA SCAN  08/14/2016  . PNA vac Low Risk Adult (1 of 2 - PCV13) 08/14/2016  . HEMOGLOBIN A1C  11/29/2017  . INFLUENZA VACCINE  06/18/2018  . TETANUS/TDAP  11/19/2019  . Hepatitis C Screening  Completed     Plan:    End of life planning; Advance aging; Advanced directives discussed. Copy  of current HCPOA/Living Will/DNR requested.    I have personally reviewed and noted the following in the patient's chart:   . Medical  and social history . Use of alcohol, tobacco or illicit drugs  . Current medications and supplements . Functional ability and status . Nutritional status . Physical activity . Advanced directives . List of other physicians . Hospitalizations, surgeries, and ER visits in previous 12 months . Vitals . Screenings to include cognitive, depression, and falls . Referrals and appointments  In addition, I have reviewed and discussed with patient certain preventive protocols, quality metrics, and best practice recommendations. A written personalized care plan for preventive services as well as general preventive health recommendations were provided to patient.     Varney Biles, LPN   07/22/7124

## 2018-02-26 ENCOUNTER — Encounter: Payer: Self-pay | Admitting: Internal Medicine

## 2018-02-26 ENCOUNTER — Ambulatory Visit (INDEPENDENT_AMBULATORY_CARE_PROVIDER_SITE_OTHER): Payer: Medicare Other | Admitting: Internal Medicine

## 2018-02-26 VITALS — BP 142/90 | HR 62 | Temp 98.2°F | Ht 60.0 in | Wt 221.6 lb

## 2018-02-26 DIAGNOSIS — Z1329 Encounter for screening for other suspected endocrine disorder: Secondary | ICD-10-CM | POA: Diagnosis not present

## 2018-02-26 DIAGNOSIS — R7989 Other specified abnormal findings of blood chemistry: Secondary | ICD-10-CM

## 2018-02-26 DIAGNOSIS — K089 Disorder of teeth and supporting structures, unspecified: Secondary | ICD-10-CM

## 2018-02-26 DIAGNOSIS — R140 Abdominal distension (gaseous): Secondary | ICD-10-CM

## 2018-02-26 DIAGNOSIS — R945 Abnormal results of liver function studies: Secondary | ICD-10-CM

## 2018-02-26 DIAGNOSIS — I10 Essential (primary) hypertension: Secondary | ICD-10-CM | POA: Diagnosis not present

## 2018-02-26 DIAGNOSIS — E559 Vitamin D deficiency, unspecified: Secondary | ICD-10-CM | POA: Diagnosis not present

## 2018-02-26 DIAGNOSIS — Z1231 Encounter for screening mammogram for malignant neoplasm of breast: Secondary | ICD-10-CM

## 2018-02-26 DIAGNOSIS — Z1389 Encounter for screening for other disorder: Secondary | ICD-10-CM

## 2018-02-26 DIAGNOSIS — R7303 Prediabetes: Secondary | ICD-10-CM | POA: Insufficient documentation

## 2018-02-26 LAB — URINALYSIS, ROUTINE W REFLEX MICROSCOPIC
BILIRUBIN URINE: NEGATIVE
HGB URINE DIPSTICK: NEGATIVE
Ketones, ur: NEGATIVE
Leukocytes, UA: NEGATIVE
Nitrite: NEGATIVE
Specific Gravity, Urine: <=1.005 — AB (ref 1.000–1.030)
TOTAL PROTEIN, URINE-UPE24: NEGATIVE
UROBILINOGEN UA: 0.2 (ref 0.0–1.0)
Urine Glucose: NEGATIVE
pH: 6 (ref 5.0–8.0)

## 2018-02-26 LAB — LIPID PANEL
CHOL/HDL RATIO: 4
Cholesterol: 193 mg/dL (ref 0–200)
HDL: 48.9 mg/dL (ref >39.00)
LDL Cholesterol: 123 mg/dL — ABNORMAL HIGH (ref 0–99)
NONHDL: 143.93
Triglycerides: 105 mg/dL (ref 0.0–149.0)
VLDL: 21 mg/dL (ref 0.0–40.0)

## 2018-02-26 LAB — COMPREHENSIVE METABOLIC PANEL
ALK PHOS: 78 U/L (ref 39–117)
ALT: 23 U/L (ref 0–35)
AST: 25 U/L (ref 0–37)
Albumin: 4.1 g/dL (ref 3.5–5.2)
BILIRUBIN TOTAL: 0.5 mg/dL (ref 0.2–1.2)
BUN: 16 mg/dL (ref 6–23)
CO2: 26 meq/L (ref 19–32)
Calcium: 9.4 mg/dL (ref 8.4–10.5)
Chloride: 97 mEq/L (ref 96–112)
Creatinine, Ser: 0.84 mg/dL (ref 0.40–1.20)
GFR: 71.98 mL/min (ref >60.00)
GLUCOSE: 108 mg/dL — AB (ref 70–99)
POTASSIUM: 3.5 meq/L (ref 3.5–5.1)
SODIUM: 136 meq/L (ref 135–145)
TOTAL PROTEIN: 7.7 g/dL (ref 6.0–8.3)

## 2018-02-26 LAB — HEMOGLOBIN A1C: HEMOGLOBIN A1C: 6.3 % (ref 4.6–6.5)

## 2018-02-26 LAB — VITAMIN D 25 HYDROXY (VIT D DEFICIENCY, FRACTURES): VITD: 17.86 ng/mL — ABNORMAL LOW (ref 30.00–100.00)

## 2018-02-26 LAB — TSH: TSH: 1.35 u[IU]/mL (ref 0.35–4.50)

## 2018-02-26 MED ORDER — ATENOLOL 50 MG PO TABS: 50.0000 mg | ORAL_TABLET | Freq: Every day | ORAL | 0 refills | Status: DC

## 2018-02-26 MED ORDER — AMLODIPINE BESYLATE 5 MG PO TABS: 5.0000 mg | ORAL_TABLET | Freq: Every day | ORAL | 0 refills | Status: DC

## 2018-02-26 NOTE — Progress Notes (Signed)
Pre visit review using our clinic review tool, if applicable. No additional management support is needed unless otherwise documented below in the visit note. 

## 2018-02-26 NOTE — Patient Instructions (Signed)
F/u in 6 months sooner if needed  [place] [phone]  Hypertension Hypertension, commonly called high blood pressure, is when the force of blood pumping through the arteries is too strong. The arteries are the blood vessels that carry blood from the heart throughout the body. Hypertension forces the heart to work harder to pump blood and may cause arteries to become narrow or stiff. Having untreated or uncontrolled hypertension can cause heart attacks, strokes, kidney disease, and other problems. A blood pressure reading consists of a higher number over a lower number. Ideally, your blood pressure should be below 120/80. The first ("top") number is called the systolic pressure. It is a measure of the pressure in your arteries as your heart beats. The second ("bottom") number is called the diastolic pressure. It is a measure of the pressure in your arteries as the heart relaxes. What are the causes? The cause of this condition is not known. What increases the risk? Some risk factors for high blood pressure are under your control. Others are not. Factors you can change  Smoking.  Having type 2 diabetes mellitus, high cholesterol, or both.  Not getting enough exercise or physical activity.  Being overweight.  Having too much fat, sugar, calories, or salt (sodium) in your diet.  Drinking too much alcohol. Factors that are difficult or impossible to change  Having chronic kidney disease.  Having a family history of high blood pressure.  Age. Risk increases with age.  Race. You may be at higher risk if you are African-American.  Gender. Men are at higher risk than women before age 46. After age 13, women are at higher risk than men.  Having obstructive sleep apnea.  Stress. What are the signs or symptoms? Extremely high blood pressure (hypertensive crisis) may cause:  Headache.  Anxiety.  Shortness of breath.  Nosebleed.  Nausea and  vomiting.  Severe chest pain.  Jerky movements you cannot control (seizures).  How is this diagnosed? This condition is diagnosed by measuring your blood pressure while you are seated, with your arm resting on a surface. The cuff of the blood pressure monitor will be placed directly against the skin of your upper arm at the level of your heart. It should be measured at least twice using the same arm. Certain conditions can cause a difference in blood pressure between your right and left arms. Certain factors can cause blood pressure readings to be lower or higher than normal (elevated) for a short period of time:  When your blood pressure is higher when you are in a health care provider's office than when you are at home, this is called white coat hypertension. Most people with this condition do not need medicines.  When your blood pressure is higher at home than when you are in a health care provider's office, this is called masked hypertension. Most people with this condition may need medicines to control blood pressure.  If you have a high blood pressure reading during one visit or you have normal blood pressure with other risk factors:  You may be asked to return on a different day to have your blood pressure checked again.  You may be asked to monitor your blood pressure at home for 1 week or longer.  If you are diagnosed with hypertension, you may have other blood or imaging tests to help your health care provider understand your overall risk for other conditions. How is this treated? This condition is treated by  making healthy lifestyle changes, such as eating healthy foods, exercising more, and reducing your alcohol intake. Your health care provider may prescribe medicine if lifestyle changes are not enough to get your blood pressure under control, and if:  Your systolic blood pressure is above 130.  Your diastolic blood pressure is above 80.  Your personal target blood pressure  may vary depending on your medical conditions, your age, and other factors. Follow these instructions at home: Eating and drinking  Eat a diet that is high in fiber and potassium, and low in sodium, added sugar, and fat. An example eating plan is called the DASH (Dietary Approaches to Stop Hypertension) diet. To eat this way: ? Eat plenty of fresh fruits and vegetables. Try to fill half of your plate at each meal with fruits and vegetables. ? Eat whole grains, such as whole wheat pasta, brown rice, or whole grain bread. Fill about one quarter of your plate with whole grains. ? Eat or drink low-fat dairy products, such as skim milk or low-fat yogurt. ? Avoid fatty cuts of meat, processed or cured meats, and poultry with skin. Fill about one quarter of your plate with lean proteins, such as fish, chicken without skin, beans, eggs, and tofu. ? Avoid premade and processed foods. These tend to be higher in sodium, added sugar, and fat.  Reduce your daily sodium intake. Most people with hypertension should eat less than 1,500 mg of sodium a day.  Limit alcohol intake to no more than 1 drink a day for nonpregnant women and 2 drinks a day for men. One drink equals 12 oz of beer, 5 oz of wine, or 1 oz of hard liquor. Lifestyle  Work with your health care provider to maintain a healthy body weight or to lose weight. Ask what an ideal weight is for you.  Get at least 30 minutes of exercise that causes your heart to beat faster (aerobic exercise) most days of the week. Activities may include walking, swimming, or biking.  Include exercise to strengthen your muscles (resistance exercise), such as pilates or lifting weights, as part of your weekly exercise routine. Try to do these types of exercises for 30 minutes at least 3 days a week.  Do not use any products that contain nicotine or tobacco, such as cigarettes and e-cigarettes. If you need help quitting, ask your health care provider.  Monitor your  blood pressure at home as told by your health care provider.  Keep all follow-up visits as told by your health care provider. This is important. Medicines  Take over-the-counter and prescription medicines only as told by your health care provider. Follow directions carefully. Blood pressure medicines must be taken as prescribed.  Do not skip doses of blood pressure medicine. Doing this puts you at risk for problems and can make the medicine less effective.  Ask your health care provider about side effects or reactions to medicines that you should watch for. Contact a health care provider if:  You think you are having a reaction to a medicine you are taking.  You have headaches that keep coming back (recurring).  You feel dizzy.  You have swelling in your ankles.  You have trouble with your vision. Get help right away if:  You develop a severe headache or confusion.  You have unusual weakness or numbness.  You feel faint.  You have severe pain in your chest or abdomen.  You vomit repeatedly.  You have trouble breathing. Summary  Hypertension is  when the force of blood pumping through your arteries is too strong. If this condition is not controlled, it may put you at risk for serious complications.  Your personal target blood pressure may vary depending on your medical conditions, your age, and other factors. For most people, a normal blood pressure is less than 120/80.  Hypertension is treated with lifestyle changes, medicines, or a combination of both. Lifestyle changes include weight loss, eating a healthy, low-sodium diet, exercising more, and limiting alcohol. This information is not intended to replace advice given to you by your health care provider. Make sure you discuss any questions you have with your health care provider. Document Released: 11/04/2005 Document Revised: 10/02/2016 Document Reviewed: 10/02/2016 Elsevier Interactive Patient Education  [employer].

## 2018-03-01 ENCOUNTER — Encounter: Payer: Self-pay | Admitting: Internal Medicine

## 2018-03-01 ENCOUNTER — Other Ambulatory Visit: Payer: Self-pay | Admitting: Internal Medicine

## 2018-03-01 DIAGNOSIS — E559 Vitamin D deficiency, unspecified: Secondary | ICD-10-CM

## 2018-03-01 MED ORDER — CHOLECALCIFEROL 1.25 MG (50000 UT) PO CAPS: 50000.0000 [IU] | ORAL_CAPSULE | ORAL | 1 refills | Status: DC
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
## 2018-03-05 ENCOUNTER — Ambulatory Visit: Payer: Medicare Other | Admitting: Internal Medicine

## 2018-03-05 ENCOUNTER — Other Ambulatory Visit: Payer: Self-pay | Admitting: Internal Medicine

## 2018-03-05 DIAGNOSIS — R140 Abdominal distension (gaseous): Secondary | ICD-10-CM

## 2018-03-05 DIAGNOSIS — R945 Abnormal results of liver function studies: Secondary | ICD-10-CM

## 2018-03-05 DIAGNOSIS — R7989 Other specified abnormal findings of blood chemistry: Secondary | ICD-10-CM

## 2018-03-05 DIAGNOSIS — K838 Other specified diseases of biliary tract: Secondary | ICD-10-CM

## 2018-03-13 ENCOUNTER — Ambulatory Visit
Admission: RE | Admit: 2018-03-13 | Discharge: 2018-03-13 | Disposition: A | Discharge status: Home / Self Care | Payer: Medicare Other | Source: Ambulatory Visit | Attending: Internal Medicine | Admitting: Internal Medicine

## 2018-03-13 DIAGNOSIS — R140 Abdominal distension (gaseous): Secondary | ICD-10-CM | POA: Insufficient documentation

## 2018-03-13 DIAGNOSIS — R945 Abnormal results of liver function studies: Secondary | ICD-10-CM | POA: Insufficient documentation

## 2018-03-13 DIAGNOSIS — K760 Fatty (change of) liver, not elsewhere classified: Secondary | ICD-10-CM | POA: Diagnosis not present

## 2018-03-13 DIAGNOSIS — Z9049 Acquired absence of other specified parts of digestive tract: Secondary | ICD-10-CM | POA: Insufficient documentation

## 2018-03-13 DIAGNOSIS — R7989 Other specified abnormal findings of blood chemistry: Secondary | ICD-10-CM

## 2018-03-13 DIAGNOSIS — K838 Other specified diseases of biliary tract: Secondary | ICD-10-CM | POA: Insufficient documentation

## 2018-04-08 ENCOUNTER — Other Ambulatory Visit: Payer: Self-pay | Admitting: Internal Medicine

## 2018-04-08 DIAGNOSIS — I10 Essential (primary) hypertension: Secondary | ICD-10-CM

## 2018-04-08 MED ORDER — AMLODIPINE BESYLATE 5 MG PO TABS: 5.0000 mg | ORAL_TABLET | Freq: Every day | ORAL | 0 refills | Status: DC

## 2018-04-23 ENCOUNTER — Telehealth: Payer: Self-pay

## 2018-04-23 NOTE — Telephone Encounter (Signed)
Copied from [place] [id number]. Topic: General - Other >> Apr 23, 2018  1:57 PM Ivar Drape wrote: Reason for CRM:   Patient stated when she was in to see her provider, she was told about a Dentist that took patients on what their income.  She would like to know who this dentist is.

## 2018-04-24 NOTE — Telephone Encounter (Signed)
Please advise 

## 2018-04-24 NOTE — Telephone Encounter (Signed)
Called patient and gave her the name of the dentist that she was requesting and sent the information via mychart.

## 2018-04-24 NOTE — Telephone Encounter (Signed)
[facility] [phone]  [phone]   [place]

## 2018-05-26 ENCOUNTER — Other Ambulatory Visit: Payer: Self-pay | Admitting: Internal Medicine

## 2018-05-26 DIAGNOSIS — I10 Essential (primary) hypertension: Secondary | ICD-10-CM

## 2018-05-26 MED ORDER — AMLODIPINE BESYLATE 5 MG PO TABS: 5.0000 mg | ORAL_TABLET | Freq: Every day | ORAL | 1 refills | Status: DC

## 2018-07-30 ENCOUNTER — Other Ambulatory Visit: Payer: Self-pay | Admitting: Internal Medicine

## 2018-07-30 DIAGNOSIS — I10 Essential (primary) hypertension: Secondary | ICD-10-CM

## 2018-07-30 MED ORDER — ATENOLOL 50 MG PO TABS: 50.0000 mg | ORAL_TABLET | Freq: Every day | ORAL | 0 refills | Status: DC

## 2018-09-01 ENCOUNTER — Other Ambulatory Visit: Payer: Self-pay | Admitting: Internal Medicine

## 2018-09-01 DIAGNOSIS — I10 Essential (primary) hypertension: Secondary | ICD-10-CM

## 2018-09-01 MED ORDER — ATENOLOL 50 MG PO TABS: 50.0000 mg | ORAL_TABLET | Freq: Every day | ORAL | 0 refills | Status: DC

## 2018-10-29 DIAGNOSIS — Z23 Encounter for immunization: Secondary | ICD-10-CM | POA: Diagnosis not present

## 2018-12-02 ENCOUNTER — Other Ambulatory Visit: Payer: Self-pay | Admitting: Family Medicine

## 2018-12-02 DIAGNOSIS — I10 Essential (primary) hypertension: Secondary | ICD-10-CM

## 2018-12-23 ENCOUNTER — Ambulatory Visit (INDEPENDENT_AMBULATORY_CARE_PROVIDER_SITE_OTHER): Payer: Medicare Other | Admitting: Internal Medicine

## 2018-12-23 ENCOUNTER — Ambulatory Visit (INDEPENDENT_AMBULATORY_CARE_PROVIDER_SITE_OTHER): Payer: Medicare Other

## 2018-12-23 ENCOUNTER — Encounter: Payer: Self-pay | Admitting: Internal Medicine

## 2018-12-23 VITALS — BP 150/88 | HR 67 | Temp 98.4°F | Ht 60.0 in | Wt 218.8 lb

## 2018-12-23 DIAGNOSIS — M25562 Pain in left knee: Secondary | ICD-10-CM

## 2018-12-23 DIAGNOSIS — R062 Wheezing: Secondary | ICD-10-CM

## 2018-12-23 DIAGNOSIS — G8929 Other chronic pain: Secondary | ICD-10-CM | POA: Diagnosis not present

## 2018-12-23 DIAGNOSIS — R0602 Shortness of breath: Secondary | ICD-10-CM | POA: Diagnosis not present

## 2018-12-23 DIAGNOSIS — M1712 Unilateral primary osteoarthritis, left knee: Secondary | ICD-10-CM | POA: Diagnosis not present

## 2018-12-23 DIAGNOSIS — T7840XD Allergy, unspecified, subsequent encounter: Secondary | ICD-10-CM

## 2018-12-23 DIAGNOSIS — E559 Vitamin D deficiency, unspecified: Secondary | ICD-10-CM | POA: Diagnosis not present

## 2018-12-23 DIAGNOSIS — E785 Hyperlipidemia, unspecified: Secondary | ICD-10-CM

## 2018-12-23 DIAGNOSIS — I10 Essential (primary) hypertension: Secondary | ICD-10-CM | POA: Diagnosis not present

## 2018-12-23 DIAGNOSIS — E041 Nontoxic single thyroid nodule: Secondary | ICD-10-CM | POA: Diagnosis not present

## 2018-12-23 DIAGNOSIS — Z1329 Encounter for screening for other suspected endocrine disorder: Secondary | ICD-10-CM

## 2018-12-23 DIAGNOSIS — R7303 Prediabetes: Secondary | ICD-10-CM

## 2018-12-23 DIAGNOSIS — Z1389 Encounter for screening for other disorder: Secondary | ICD-10-CM | POA: Diagnosis not present

## 2018-12-23 MED ORDER — ATENOLOL 50 MG PO TABS: 50.0000 mg | ORAL_TABLET | Freq: Every day | ORAL | 1 refills | Status: DC

## 2018-12-23 MED ORDER — TRIAMTERENE-HCTZ 37.5-25 MG PO TABS: 1.0000 | ORAL_TABLET | Freq: Every day | ORAL | 1 refills | Status: DC

## 2018-12-23 MED ORDER — MONTELUKAST SODIUM 10 MG PO TABS: 10.0000 mg | ORAL_TABLET | Freq: Every day | ORAL | 3 refills | Status: DC

## 2018-12-23 MED ORDER — AMLODIPINE BESYLATE 5 MG PO TABS: 5.0000 mg | ORAL_TABLET | Freq: Every day | ORAL | 1 refills | Status: DC

## 2018-12-23 NOTE — Patient Instructions (Signed)
Bronchospasm, Adult  Bronchospasm is a tightening of the airways going into the lungs. During an episode, it may be harder to breathe. You may cough, and you may make a whistling sound when you breathe (wheeze). This condition often affects people with asthma. What are the causes? This condition is caused by swelling and irritation in the airways. It can be triggered by:  An infection (common).  Seasonal allergies.  An allergic reaction.  Exercise.  Irritants. These include pollution, cigarette smoke, strong odors, aerosol sprays, and paint fumes.  Weather changes. Winds increase molds and pollens in the air. Cold air may cause swelling.  Stress and emotional upset. What are the signs or symptoms? Symptoms of this condition include:  Wheezing. If the episode was triggered by an allergy, wheezing may start right away or hours later.  Nighttime coughing.  Frequent or severe coughing with a simple cold.  Chest tightness.  Shortness of breath.  Decreased ability to exercise. How is this diagnosed? This condition is usually diagnosed with a review of your medical history and a physical exam. Tests, such as lung function tests, are sometimes done to look for other conditions. The need for a chest X-ray depends on where the wheezing occurs and whether it is the first time you have wheezed. How is this treated? This condition may be treated with:  Inhaled medicines. These open up the airways and help you breathe. They can be taken with an inhaler or a nebulizer device.  Corticosteroid medicines. These may be given for severe bronchospasm, usually when it is associated with asthma.  Avoiding triggers, such as irritants, infection, or allergies. Follow these instructions at home: Medicines  Take over-the-counter and prescription medicines only as told by your health care provider.  If you need to use an inhaler or nebulizer to take your medicine, ask your health care provider  to explain how to use it correctly. If you were given a spacer, always use it with your inhaler. Lifestyle  Reduce the number of triggers in your home. To do this: ? Change your heating and air conditioning filter at least once a month. ? Limit your use of fireplaces and wood stoves. ? Do not smoke. Do not allow smoking in your home. ? Avoid using perfumes and fragrances. ? Get rid of pests, such as roaches and mice, and their droppings. ? Remove any mold from your home. ? Keep your house clean and dust free. Use unscented cleaning products. ? Replace carpet with wood, tile, or vinyl flooring. Carpet can trap dander and dust. ? Use allergy-proof pillows, mattress covers, and box spring covers. ? Wash bed sheets and blankets every week in hot water. Dry them in a dryer. ? Use blankets that are made of polyester or cotton. ? Wash your hands often. ? Do not allow pets in your bedroom.  Avoid breathing in cold air when you exercise. General instructions  Have a plan for seeking medical care. Know when to call your health care provider and local emergency services, and where to get emergency care.  Stay up to date on your immunizations.  When you have an episode of bronchospasm, stay calm. Try to relax and breathe more slowly.  If you have asthma, make sure you have an asthma action plan.  Keep all follow-up visits as told by your health care provider. This is important. Contact a health care provider if:  You have muscle aches.  You have chest pain.  The mucus that you cough up (  If you have asthma, make sure you have an asthma action plan.  · Keep all follow-up visits as told by your health care provider. This is important.  Contact a health care provider if:  · You have muscle aches.  · You have chest pain.  · The mucus that you cough up (sputum) changes from clear or white to yellow, green, gray, or bloody.  · You have a fever.  · Your sputum gets thicker.  Get help right away if:  · Your wheezing and coughing get worse, even after you take your prescribed medicines.  · It gets even harder to breathe.  · You develop severe chest pain.  Summary  · Bronchospasm is a tightening of the airways going into the lungs.  · During an episode of  bronchospasm, you may have a harder time breathing. You may cough and make a whistling sound when you breathe (wheeze).  · Avoid exposure to triggers such as smoke, dust, mold, animal dander, and fragrances.  · When you have an episode of bronchospasm, stay calm. Try to relax and breathe more slowly.  This information is not intended to replace advice given to you by your health care provider. Make sure you discuss any questions you have with your health care provider.  Document Released: 11/07/2003 Document Revised: 10/31/2016 Document Reviewed: 10/31/2016  Elsevier Interactive Patient Education © 2019 [employer] Inc[employer]
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
## 2018-12-23 NOTE — Progress Notes (Signed)
Pre visit review using our clinic review tool, if applicable. No additional management support is needed unless otherwise documented below in the visit note. 

## 2018-12-24 ENCOUNTER — Encounter: Payer: Self-pay | Admitting: Internal Medicine

## 2018-12-24 DIAGNOSIS — E041 Nontoxic single thyroid nodule: Secondary | ICD-10-CM | POA: Insufficient documentation

## 2018-12-24 DIAGNOSIS — M25562 Pain in left knee: Secondary | ICD-10-CM

## 2018-12-24 DIAGNOSIS — G8929 Other chronic pain: Secondary | ICD-10-CM | POA: Insufficient documentation

## 2019-01-22 ENCOUNTER — Telehealth: Payer: Self-pay | Admitting: Internal Medicine

## 2019-01-22 ENCOUNTER — Encounter: Payer: Self-pay | Admitting: Internal Medicine

## 2019-01-22 ENCOUNTER — Other Ambulatory Visit: Payer: Self-pay | Admitting: Internal Medicine

## 2019-01-22 ENCOUNTER — Ambulatory Visit (INDEPENDENT_AMBULATORY_CARE_PROVIDER_SITE_OTHER): Payer: Medicare Other | Admitting: Internal Medicine

## 2019-01-22 VITALS — BP 130/68 | HR 70 | Temp 97.8°F | Ht 60.0 in | Wt 220.8 lb

## 2019-01-22 DIAGNOSIS — M25562 Pain in left knee: Secondary | ICD-10-CM

## 2019-01-22 DIAGNOSIS — E2839 Other primary ovarian failure: Secondary | ICD-10-CM | POA: Diagnosis not present

## 2019-01-22 DIAGNOSIS — G8929 Other chronic pain: Secondary | ICD-10-CM | POA: Diagnosis not present

## 2019-01-22 DIAGNOSIS — J309 Allergic rhinitis, unspecified: Secondary | ICD-10-CM

## 2019-01-22 DIAGNOSIS — I10 Essential (primary) hypertension: Secondary | ICD-10-CM | POA: Diagnosis not present

## 2019-01-22 DIAGNOSIS — K649 Unspecified hemorrhoids: Secondary | ICD-10-CM | POA: Diagnosis not present

## 2019-01-22 DIAGNOSIS — Z1283 Encounter for screening for malignant neoplasm of skin: Secondary | ICD-10-CM | POA: Diagnosis not present

## 2019-01-22 DIAGNOSIS — Z1231 Encounter for screening mammogram for malignant neoplasm of breast: Secondary | ICD-10-CM | POA: Diagnosis not present

## 2019-01-22 DIAGNOSIS — Z85828 Personal history of other malignant neoplasm of skin: Secondary | ICD-10-CM

## 2019-01-22 DIAGNOSIS — R0981 Nasal congestion: Secondary | ICD-10-CM

## 2019-01-22 MED ORDER — TRIAMTERENE-HCTZ 37.5-25 MG PO TABS: 1.0000 | ORAL_TABLET | Freq: Every day | ORAL | 3 refills | Status: DC

## 2019-01-22 MED ORDER — ATENOLOL 50 MG PO TABS: 50.0000 mg | ORAL_TABLET | Freq: Every day | ORAL | 3 refills | Status: DC

## 2019-01-22 MED ORDER — FLUTICASONE PROPIONATE 50 MCG/ACT NA SUSP: 1.0000 | Freq: Every day | NASAL | 11 refills | Status: DC

## 2019-01-22 MED ORDER — AMLODIPINE BESYLATE 5 MG PO TABS: 5.0000 mg | ORAL_TABLET | Freq: Every day | ORAL | 3 refills | Status: DC
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
## 2019-01-22 NOTE — Progress Notes (Signed)
Pre visit review using our clinic review tool, if applicable. No additional management support is needed unless otherwise documented below in the visit note. 

## 2019-01-22 NOTE — Patient Instructions (Addendum)
Consider otc allegra, clartin, zyrtec, xyzal as needed with flonase  Schedule nurse visit for prevnar  sch lab visit fasting   Pneumococcal Conjugate Vaccine suspension for injection What is this medicine? PNEUMOCOCCAL VACCINE (NEU mo KOK al vak SEEN) is a vaccine used to prevent pneumococcus bacterial infections. These bacteria can cause serious infections like pneumonia, meningitis, and blood infections. This vaccine will lower your chance of getting pneumonia. If you do get pneumonia, it can make your symptoms milder and your illness shorter. This vaccine will not treat an infection and will not cause infection. This vaccine is recommended for infants and young children, adults with certain medical conditions, and adults 31 years or older. This medicine may be used for other purposes; ask your health care provider or pharmacist if you have questions. COMMON BRAND NAME(S): Prevnar, Prevnar 13 What should I tell my health care provider before I take this medicine? They need to know if you have any of these conditions: -bleeding problems -fever -immune system problems -an unusual or allergic reaction to pneumococcal vaccine, diphtheria toxoid, other vaccines, latex, other medicines, foods, dyes, or preservatives -pregnant or trying to get pregnant -breast-feeding How should I use this medicine? This vaccine is for injection into a muscle. It is given by a health care professional. A copy of Vaccine Information Statements will be given before each vaccination. Read this sheet carefully each time. The sheet may change frequently. Talk to your pediatrician regarding the use of this medicine in children. While this drug may be prescribed for children as young as 84 weeks old for selected conditions, precautions do apply. Overdosage: If you think you have taken too much of this medicine contact a poison control center or emergency room at once. NOTE: This medicine is only for you. Do not share this  medicine with others. What if I miss a dose? It is important not to miss your dose. Call your doctor or health care professional if you are unable to keep an appointment. What may interact with this medicine? -medicines for cancer chemotherapy -medicines that suppress your immune function -steroid medicines like prednisone or cortisone This list may not describe all possible interactions. Give your health care provider a list of all the medicines, herbs, non-prescription drugs, or dietary supplements you use. Also tell them if you smoke, drink alcohol, or use illegal drugs. Some items may interact with your medicine. What should I watch for while using this medicine? Mild fever and pain should go away in 3 days or less. Report any unusual symptoms to your doctor or health care professional. What side effects may I notice from receiving this medicine? Side effects that you should report to your doctor or health care professional as soon as possible: -allergic reactions like skin rash, itching or hives, swelling of the face, lips, or tongue -breathing problems -confused -fast or irregular heartbeat -fever over 102 degrees F -seizures -unusual bleeding or bruising -unusual muscle weakness Side effects that usually do not require medical attention (report to your doctor or health care professional if they continue or are bothersome): -aches and pains -diarrhea -fever of 102 degrees F or less -headache -irritable -loss of appetite -pain, tender at site where injected -trouble sleeping This list may not describe all possible side effects. Call your doctor for medical advice about side effects. You may report side effects to FDA at 1-800-FDA-1088. Where should I keep my medicine? This does not apply. This vaccine is given in a clinic, pharmacy, doctor's office, or other  health care setting and will not be stored at home. NOTE: This sheet is a summary. It may not cover all possible  information. If you have questions about this medicine, talk to your doctor, pharmacist, or health care provider.  2019 Elsevier/Gold Standard (2014-08-11 10:27:27)

## 2019-01-22 NOTE — Telephone Encounter (Signed)
Pt saw Dr. Marigene Ehlers today. Please be aware that all long term meds go to [facility] and all should be 90 day supply.

## 2019-02-15 ENCOUNTER — Encounter: Payer: Self-pay | Admitting: Internal Medicine

## 2019-02-22 ENCOUNTER — Ambulatory Visit: Payer: Medicare Other

## 2019-02-23 ENCOUNTER — Other Ambulatory Visit: Payer: TRICARE For Life (TFL)

## 2019-04-02 ENCOUNTER — Telehealth: Payer: Self-pay | Admitting: Internal Medicine

## 2019-04-02 NOTE — Telephone Encounter (Signed)
Call pt is she going to do cologuard stool test for colon cancer?  If so the order expires 01/26/2020 needs to be done on a Monday and before this date     [place]

## 2019-04-02 NOTE — Telephone Encounter (Signed)
Left message for patient to return call back. [facility] may give and obtain information.  

## 2019-06-22 ENCOUNTER — Telehealth: Payer: Self-pay

## 2019-06-22 NOTE — Telephone Encounter (Signed)
Appt cancelled for 06/25/19.

## 2019-06-22 NOTE — Telephone Encounter (Signed)
Copied from [place] [id number]. Topic: Quick Communication - Appointment Cancellation >> Jun 22, 2019  1:42 PM Rainey [place] A wrote: Patient called to cancel appointment scheduled for 06/25/2019 Patient has bone density and mammogram scheduled and will call back to reschedule

## 2019-06-24 DIAGNOSIS — M8589 Other specified disorders of bone density and structure, multiple sites: Secondary | ICD-10-CM | POA: Diagnosis not present

## 2019-06-24 DIAGNOSIS — Z1231 Encounter for screening mammogram for malignant neoplasm of breast: Secondary | ICD-10-CM | POA: Diagnosis not present

## 2019-06-24 DIAGNOSIS — Z780 Asymptomatic menopausal state: Secondary | ICD-10-CM | POA: Diagnosis not present

## 2019-06-24 LAB — HM DEXA SCAN

## 2019-06-24 LAB — HM MAMMOGRAPHY

## 2019-06-25 ENCOUNTER — Ambulatory Visit: Payer: TRICARE For Life (TFL) | Admitting: Internal Medicine

## 2019-06-29 ENCOUNTER — Encounter: Payer: Self-pay | Admitting: Internal Medicine

## 2019-06-29 ENCOUNTER — Telehealth: Payer: Self-pay | Admitting: Internal Medicine

## 2019-06-29 NOTE — Telephone Encounter (Signed)
Osteopenia + bone density [facility] -we need to schedule fasting labs and also check vitamin D, schedule this   rec vitamin D3 5000 IU daily over the counter with calcium 600 mg 1-2 x per day

## 2019-07-01 ENCOUNTER — Ambulatory Visit: Payer: Self-pay

## 2019-07-01 NOTE — Telephone Encounter (Signed)
Left message for patient to return call back. [facility] may give results and obtain information.  

## 2019-07-01 NOTE — Telephone Encounter (Signed)
Call pt and schedule appt fasting labs  Thanks [place]

## 2019-07-01 NOTE — Telephone Encounter (Signed)
Provided recommendations of Dr, Karlyn Agee -Scocuzza Patient voices understanding.  Will call back to make fasting lab appointment.  Will start medications tomorrow am.

## 2019-07-05 ENCOUNTER — Encounter: Payer: Self-pay | Admitting: Internal Medicine

## 2019-07-09 ENCOUNTER — Telehealth: Payer: Self-pay | Admitting: Internal Medicine

## 2019-07-09 NOTE — Telephone Encounter (Signed)
I called pt and left vm to call ofc to sch fasting labs.

## 2019-07-12 LAB — HM DIABETES EYE EXAM

## 2019-07-21 ENCOUNTER — Encounter: Payer: Self-pay | Admitting: Internal Medicine

## 2019-07-21 ENCOUNTER — Other Ambulatory Visit: Payer: Self-pay | Admitting: Internal Medicine

## 2019-07-21 ENCOUNTER — Telehealth: Payer: Self-pay | Admitting: Internal Medicine

## 2019-07-21 DIAGNOSIS — H269 Unspecified cataract: Secondary | ICD-10-CM

## 2019-07-21 DIAGNOSIS — I10 Essential (primary) hypertension: Secondary | ICD-10-CM

## 2019-07-21 DIAGNOSIS — H547 Unspecified visual loss: Secondary | ICD-10-CM

## 2019-07-21 MED ORDER — AMLODIPINE BESYLATE 5 MG PO TABS: 5.0000 mg | ORAL_TABLET | Freq: Every day | ORAL | 3 refills | Status: DC

## 2019-07-21 MED ORDER — TRIAMTERENE-HCTZ 37.5-25 MG PO TABS: 1.0000 | ORAL_TABLET | Freq: Every day | ORAL | 3 refills | Status: DC

## 2019-07-21 MED ORDER — ATENOLOL 50 MG PO TABS: 50.0000 mg | ORAL_TABLET | Freq: Every day | ORAL | 3 refills | Status: DC

## 2019-07-21 NOTE — Telephone Encounter (Signed)
Left message for patient to return call back. [facility] may give and obtain information.  

## 2019-07-21 NOTE — Telephone Encounter (Signed)
Does she need blood pressure medication triamterine-hctz 37.5-25 mg sent to [facility] or is she getting from mail order last sent to mail order 01/2019 for 1 year supply   [place]

## 2019-07-21 NOTE — Telephone Encounter (Signed)
Called patient.  No answer.  LMTCB. 

## 2019-07-21 NOTE — Telephone Encounter (Signed)
Copied from [place] [phone]. Topic: Quick Communication - Rx Refill/Question >> Jul 21, 2019  2:11 PM Izola Price, [place] A wrote: Medication: triamterine-hctz 37.5-25 mg (Patient contacted pharmacy and they do not have the 1 year supply on file. Patient needs medication sent back to pharmacy and is also requesting a callback once meds have been sent to pharmacy.)  Has the patient contacted their pharmacy? Yes (Agent: If no, request that the patient contact the pharmacy for the refill.) (Agent: If yes, when and what did the pharmacy advise?)Contact PCP  Preferred Pharmacy (with phone number or street name): [id number] [place], [place] [phone] (Phone) [phone] (Fax)    Agent: Please be advised that RX refills may take up to 3 business days. We ask that you follow-up with your pharmacy.

## 2019-07-21 NOTE — Telephone Encounter (Signed)
Requested medication (s) are due for refill today: yes  Requested medication (s) are on the active medication list: yes  Last refill:  01/2019  Future visit scheduled: no  Notes to clinic:  Review for refill 90 day    Requested Prescriptions  Pending Prescriptions Disp Refills   triamterene-hydrochlorothiazide (MAXZIDE-25) 37.5-25 MG tablet 90 tablet 3    Sig: Take 1 tablet by mouth daily.     Cardiovascular: Diuretic Combos Failed - 07/21/2019  2:25 PM      Failed - K in normal range and within 360 days    Potassium  Date Value Ref Range Status  02/26/2018 3.5 3.5 - 5.1 mEq/L Final         Failed - Na in normal range and within 360 days    Sodium  Date Value Ref Range Status  02/26/2018 136 135 - 145 mEq/L Final         Failed - Cr in normal range and within 360 days    Creat  Date Value Ref Range Status  06/13/2017 1.00 (H) 0.50 - 0.99 mg/dL Final    Comment:      For patients > or = 68 years of age: The upper reference limit for Creatinine is approximately 13% higher for people identified as African-American.      Creatinine, Ser  Date Value Ref Range Status  02/26/2018 0.84 0.40 - 1.20 mg/dL Final         Failed - Ca in normal range and within 360 days    Calcium  Date Value Ref Range Status  02/26/2018 9.4 8.4 - 10.5 mg/dL Final         Passed - Last BP in normal range    BP Readings from Last 1 Encounters:  01/22/19 130/68         Passed - Valid encounter within last 6 months    Recent Outpatient Visits          6 months ago Essential hypertension   [facility]-Scocuzza, Nino Glow, MD   7 months ago Essential hypertension   [facility] [place] McLean-Scocuzza, Nino Glow, MD   1 year ago Essential hypertension   [facility]  McLean-Scocuzza, Nino Glow, MD   2 years ago Hypokalemia   [facility] [place], [place], [place]   2 years ago Essential hypertension   [facility], [place], [place]

## 2019-08-04 DIAGNOSIS — H2513 Age-related nuclear cataract, bilateral: Secondary | ICD-10-CM | POA: Diagnosis not present

## 2019-08-28 ENCOUNTER — Encounter: Payer: Self-pay | Admitting: Internal Medicine

## 2019-09-19 DIAGNOSIS — U071 COVID-19: Secondary | ICD-10-CM

## 2019-09-19 HISTORY — DX: COVID-19: U07.1

## 2019-10-05 ENCOUNTER — Other Ambulatory Visit: Payer: Self-pay

## 2019-10-05 DIAGNOSIS — Z20828 Contact with and (suspected) exposure to other viral communicable diseases: Secondary | ICD-10-CM | POA: Diagnosis not present

## 2019-10-05 DIAGNOSIS — Z20822 Contact with and (suspected) exposure to covid-19: Secondary | ICD-10-CM

## 2019-10-07 ENCOUNTER — Telehealth: Payer: Self-pay | Admitting: Internal Medicine

## 2019-10-07 ENCOUNTER — Encounter: Payer: Self-pay | Admitting: Internal Medicine

## 2019-10-07 ENCOUNTER — Encounter (INDEPENDENT_AMBULATORY_CARE_PROVIDER_SITE_OTHER): Payer: Medicare Other | Admitting: Internal Medicine

## 2019-10-07 DIAGNOSIS — R0602 Shortness of breath: Secondary | ICD-10-CM

## 2019-10-07 DIAGNOSIS — U071 COVID-19: Secondary | ICD-10-CM | POA: Diagnosis not present

## 2019-10-07 DIAGNOSIS — R05 Cough: Secondary | ICD-10-CM

## 2019-10-07 DIAGNOSIS — R059 Cough, unspecified: Secondary | ICD-10-CM

## 2019-10-07 LAB — NOVEL CORONAVIRUS, NAA: SARS-CoV-2, NAA: DETECTED — AB

## 2019-10-07 NOTE — Telephone Encounter (Signed)
Pt needs appt + covid and report health dept  [place]

## 2019-10-07 NOTE — Telephone Encounter (Signed)
See if can schedule with [place] I will notify [facility]

## 2019-10-07 NOTE — Telephone Encounter (Signed)
We dont have anything, what do you suggest. Also did you already let HD know?

## 2019-10-12 ENCOUNTER — Encounter: Payer: Self-pay | Admitting: Emergency Medicine

## 2019-10-12 ENCOUNTER — Emergency Department
Admission: EM | Admit: 2019-10-12 | Discharge: 2019-10-13 | Disposition: A | Discharge status: Home / Self Care | Payer: Medicare Other | Attending: Emergency Medicine | Admitting: Emergency Medicine

## 2019-10-12 ENCOUNTER — Other Ambulatory Visit: Payer: Self-pay

## 2019-10-12 DIAGNOSIS — R531 Weakness: Secondary | ICD-10-CM | POA: Diagnosis not present

## 2019-10-12 DIAGNOSIS — E876 Hypokalemia: Secondary | ICD-10-CM | POA: Diagnosis not present

## 2019-10-12 DIAGNOSIS — E860 Dehydration: Secondary | ICD-10-CM | POA: Diagnosis not present

## 2019-10-12 DIAGNOSIS — Z79899 Other long term (current) drug therapy: Secondary | ICD-10-CM | POA: Diagnosis not present

## 2019-10-12 DIAGNOSIS — I10 Essential (primary) hypertension: Secondary | ICD-10-CM | POA: Diagnosis not present

## 2019-10-12 DIAGNOSIS — U071 COVID-19: Secondary | ICD-10-CM | POA: Insufficient documentation

## 2019-10-12 LAB — URINALYSIS, COMPLETE (UACMP) WITH MICROSCOPIC
Bilirubin Urine: NEGATIVE
Glucose, UA: NEGATIVE mg/dL
Hgb urine dipstick: NEGATIVE
Ketones, ur: 5 mg/dL — AB
Leukocytes,Ua: NEGATIVE
Nitrite: NEGATIVE
Protein, ur: 100 mg/dL — AB
Specific Gravity, Urine: 1.021 (ref 1.005–1.030)
pH: 6 (ref 5.0–8.0)

## 2019-10-12 LAB — BASIC METABOLIC PANEL
Anion gap: 16 — ABNORMAL HIGH (ref 5–15)
BUN: 15 mg/dL (ref 8–23)
CO2: 26 mmol/L (ref 22–32)
Calcium: 8.6 mg/dL — ABNORMAL LOW (ref 8.9–10.3)
Chloride: 91 mmol/L — ABNORMAL LOW (ref 98–111)
Creatinine, Ser: 0.93 mg/dL (ref 0.44–1.00)
GFR calc Af Amer: >60 mL/min (ref >60)
GFR calc non Af Amer: >60 mL/min (ref >60)
Glucose, Bld: 169 mg/dL — ABNORMAL HIGH (ref 70–99)
Potassium: 2.7 mmol/L — CL (ref 3.5–5.1)
Sodium: 133 mmol/L — ABNORMAL LOW (ref 135–145)

## 2019-10-12 LAB — CBC
HCT: 43.3 % (ref 36.0–46.0)
Hemoglobin: 15 g/dL (ref 12.0–15.0)
MCH: 30.9 pg (ref 26.0–34.0)
MCHC: 34.6 g/dL (ref 30.0–36.0)
MCV: 89.3 fL (ref 80.0–100.0)
Platelets: 156 10*3/uL (ref 150–400)
RBC: 4.85 MIL/uL (ref 3.87–5.11)
RDW: 13 % (ref 11.5–15.5)
WBC: 4.6 10*3/uL (ref 4.0–10.5)
nRBC: 0 % (ref 0.0–0.2)

## 2019-10-12 LAB — TROPONIN I (HIGH SENSITIVITY): Troponin I (High Sensitivity): 9 ng/L (ref <18)

## 2019-10-12 LAB — MAGNESIUM: Magnesium: 1.9 mg/dL (ref 1.7–2.4)

## 2019-10-12 MED ORDER — POTASSIUM CHLORIDE 10 MEQ/100ML IV SOLN
10.0000 meq | INTRAVENOUS | Status: AC
  Administered 2019-10-12 – 2019-10-13 (×3): 10 meq via INTRAVENOUS
  Filled 2019-10-12 (×3): qty 100

## 2019-10-12 MED ORDER — POTASSIUM CHLORIDE 20 MEQ PO PACK
40.0000 meq | PACK | Freq: Once | ORAL | Status: DC
  Filled 2019-10-12: qty 2

## 2019-10-12 MED ORDER — ONDANSETRON 4 MG PO TBDP
4.0000 mg | ORAL_TABLET | Freq: Once | ORAL | Status: AC | PRN
  Administered 2019-10-12: 4 mg via ORAL
  Filled 2019-10-12: qty 1

## 2019-10-12 MED ORDER — ACETAMINOPHEN 325 MG PO TABS
650.0000 mg | ORAL_TABLET | Freq: Once | ORAL | Status: AC | PRN
  Administered 2019-10-12: 650 mg via ORAL
  Filled 2019-10-12: qty 2

## 2019-10-12 MED ORDER — SODIUM CHLORIDE 0.9 % IV BOLUS
1000.0000 mL | Freq: Once | INTRAVENOUS | Status: AC
  Administered 2019-10-12: 1000 mL via INTRAVENOUS

## 2019-10-12 MED ORDER — MAGNESIUM SULFATE 2 GM/50ML IV SOLN
2.0000 g | Freq: Once | INTRAVENOUS | Status: AC
  Administered 2019-10-13: 2 g via INTRAVENOUS
  Filled 2019-10-12: qty 50

## 2019-10-12 MED ORDER — ONDANSETRON HCL 4 MG/2ML IJ SOLN
4.0000 mg | Freq: Once | INTRAMUSCULAR | Status: AC | PRN
  Administered 2019-10-12: 4 mg via INTRAVENOUS
  Filled 2019-10-12: qty 2

## 2019-10-12 NOTE — ED Triage Notes (Signed)
Patient to ER for c/o generalized weakness and nausea. Patient reports she began feeling bad one week ago, had positive Covid test on Thursday (test done through [facility]). Patient states she has been feeling "like I could pass out" today.

## 2019-10-12 NOTE — ED Notes (Signed)
Patient reports continued nausea. Zofran given as ordered per protocol.

## 2019-10-12 NOTE — ED Notes (Signed)
Pt uprite on stretcher in exam room; st cough on Monday, COVID test Tues, results positive on Thursday; since weekend, having weakness and nausea; denies pain, denies any vomiting or diarrhea; +BS, abd soft/nondist/nontender; resp even/unlab,lungs clear

## 2019-10-13 DIAGNOSIS — U071 COVID-19: Secondary | ICD-10-CM | POA: Diagnosis not present

## 2019-10-13 LAB — BASIC METABOLIC PANEL
Anion gap: 13 (ref 5–15)
BUN: 13 mg/dL (ref 8–23)
CO2: 28 mmol/L (ref 22–32)
Calcium: 8.4 mg/dL — ABNORMAL LOW (ref 8.9–10.3)
Chloride: 94 mmol/L — ABNORMAL LOW (ref 98–111)
Creatinine, Ser: 0.85 mg/dL (ref 0.44–1.00)
GFR calc Af Amer: >60 mL/min (ref >60)
GFR calc non Af Amer: >60 mL/min (ref >60)
Glucose, Bld: 128 mg/dL — ABNORMAL HIGH (ref 70–99)
Potassium: 3.9 mmol/L (ref 3.5–5.1)
Sodium: 135 mmol/L (ref 135–145)

## 2019-10-13 LAB — URINE CULTURE: Culture: NO GROWTH

## 2019-10-13 MED ORDER — ONDANSETRON 4 MG PO TBDP: 4.0000 mg | ORAL_TABLET | Freq: Three times a day (TID) | ORAL | 0 refills | Status: DC | PRN

## 2019-10-13 NOTE — ED Notes (Signed)
Pt refuses PO klor-con, stating that she doesn't like the taste; MD notified

## 2019-10-13 NOTE — ED Notes (Signed)
Pt ambulatory to BR to void; no distress or difficulty noted, no c/o; oxim remains 95% on ra with ambulation; MD aware
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
## 2019-10-15 ENCOUNTER — Emergency Department (HOSPITAL_COMMUNITY)
Admission: EM | Admit: 2019-10-15 | Discharge: 2019-10-15 | Disposition: A | Discharge status: Home / Self Care | Payer: Medicare Other | Attending: Emergency Medicine | Admitting: Emergency Medicine

## 2019-10-15 ENCOUNTER — Encounter (HOSPITAL_COMMUNITY): Payer: Self-pay | Admitting: Emergency Medicine

## 2019-10-15 ENCOUNTER — Other Ambulatory Visit: Payer: Self-pay

## 2019-10-15 ENCOUNTER — Emergency Department (HOSPITAL_COMMUNITY): Payer: Medicare Other

## 2019-10-15 DIAGNOSIS — Z79899 Other long term (current) drug therapy: Secondary | ICD-10-CM | POA: Diagnosis not present

## 2019-10-15 DIAGNOSIS — E876 Hypokalemia: Secondary | ICD-10-CM | POA: Insufficient documentation

## 2019-10-15 DIAGNOSIS — I10 Essential (primary) hypertension: Secondary | ICD-10-CM | POA: Diagnosis not present

## 2019-10-15 DIAGNOSIS — R0602 Shortness of breath: Secondary | ICD-10-CM | POA: Diagnosis not present

## 2019-10-15 DIAGNOSIS — Z85828 Personal history of other malignant neoplasm of skin: Secondary | ICD-10-CM | POA: Diagnosis not present

## 2019-10-15 DIAGNOSIS — U071 COVID-19: Secondary | ICD-10-CM | POA: Diagnosis not present

## 2019-10-15 DIAGNOSIS — K029 Dental caries, unspecified: Secondary | ICD-10-CM | POA: Insufficient documentation

## 2019-10-15 DIAGNOSIS — R05 Cough: Secondary | ICD-10-CM | POA: Diagnosis present

## 2019-10-15 DIAGNOSIS — K047 Periapical abscess without sinus: Secondary | ICD-10-CM | POA: Diagnosis not present

## 2019-10-15 LAB — CBC
HCT: 41.9 % (ref 36.0–46.0)
Hemoglobin: 14.4 g/dL (ref 12.0–15.0)
MCH: 31.2 pg (ref 26.0–34.0)
MCHC: 34.4 g/dL (ref 30.0–36.0)
MCV: 90.7 fL (ref 80.0–100.0)
Platelets: 190 10*3/uL (ref 150–400)
RBC: 4.62 MIL/uL (ref 3.87–5.11)
RDW: 13.6 % (ref 11.5–15.5)
WBC: 3.9 10*3/uL — ABNORMAL LOW (ref 4.0–10.5)
nRBC: 0 % (ref 0.0–0.2)

## 2019-10-15 LAB — COMPREHENSIVE METABOLIC PANEL
ALT: 23 U/L (ref 0–44)
AST: 36 U/L (ref 15–41)
Albumin: 3.3 g/dL — ABNORMAL LOW (ref 3.5–5.0)
Alkaline Phosphatase: 63 U/L (ref 38–126)
Anion gap: 14 (ref 5–15)
BUN: 11 mg/dL (ref 8–23)
CO2: 30 mmol/L (ref 22–32)
Calcium: 8.3 mg/dL — ABNORMAL LOW (ref 8.9–10.3)
Chloride: 91 mmol/L — ABNORMAL LOW (ref 98–111)
Creatinine, Ser: 0.88 mg/dL (ref 0.44–1.00)
GFR calc Af Amer: >60 mL/min (ref >60)
GFR calc non Af Amer: >60 mL/min (ref >60)
Glucose, Bld: 123 mg/dL — ABNORMAL HIGH (ref 70–99)
Potassium: 3 mmol/L — ABNORMAL LOW (ref 3.5–5.1)
Sodium: 135 mmol/L (ref 135–145)
Total Bilirubin: 0.8 mg/dL (ref 0.3–1.2)
Total Protein: 7.1 g/dL (ref 6.5–8.1)

## 2019-10-15 MED ORDER — DOXYCYCLINE HYCLATE 100 MG PO TABS
100.0000 mg | ORAL_TABLET | Freq: Once | ORAL | Status: AC
  Administered 2019-10-15: 100 mg via ORAL
  Filled 2019-10-15: qty 1

## 2019-10-15 MED ORDER — POTASSIUM CHLORIDE CRYS ER 20 MEQ PO TBCR
40.0000 meq | EXTENDED_RELEASE_TABLET | Freq: Once | ORAL | Status: AC
  Administered 2019-10-15: 12:00:00 40 meq via ORAL
  Filled 2019-10-15: qty 2

## 2019-10-15 MED ORDER — DOXYCYCLINE HYCLATE 100 MG PO CAPS: 100.0000 mg | ORAL_CAPSULE | Freq: Two times a day (BID) | ORAL | 0 refills | Status: DC

## 2019-10-15 MED ORDER — SODIUM CHLORIDE 0.9 % IV BOLUS
500.0000 mL | Freq: Once | INTRAVENOUS | Status: AC
  Administered 2019-10-15: 12:00:00 500 mL via INTRAVENOUS

## 2019-10-15 MED ORDER — ONDANSETRON HCL 4 MG/2ML IJ SOLN
4.0000 mg | Freq: Once | INTRAMUSCULAR | Status: AC
  Administered 2019-10-15: 4 mg via INTRAVENOUS
  Filled 2019-10-15: qty 2

## 2019-10-15 MED ORDER — POTASSIUM CHLORIDE CRYS ER 20 MEQ PO TBCR: EXTENDED_RELEASE_TABLET | ORAL | 0 refills | Status: DC

## 2019-10-15 NOTE — ED Notes (Signed)
Reordered cmp, first sample clotted

## 2019-10-15 NOTE — ED Notes (Signed)
Placed pt on [address]L [address], Sp02 in the low 90's to upper 80's. Pt states "wearing this mask causes me to feel SHOB".

## 2019-10-15 NOTE — Discharge Instructions (Signed)
It was our pleasure to provide your ER care today - we hope that you feel better.  Take antibiotic as prescribed.  From today's labs, your potassium level is low (3) - eat plenty of fruits and vegetables, take potassium supplement as prescribed, and follow up with primary care doctor in 1 week.  See covid instructions. Take full and deep breaths, stay active. Consider taking zinc and vitamin c supplement.   For dental issues, follow up with dentist in 1 week.  Return to ER if worse, new symptoms, increased difficulty breathing, persistent vomiting, weak/faint, increased mouth and/or facial swelling or redness, or other concern.
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
## 2019-10-15 NOTE — ED Triage Notes (Signed)
Pt here for dental pain, loss of appetite, cough, wheezing, and intermittent fever. Pt states she was to have a tooth extraction but was canceled due to her diagnosis of Covid two weeks ago and believes these symptoms are likely related to her dental issues. Pt in NAD currently.

## 2019-10-18 MED ORDER — BENZONATATE 200 MG PO CAPS: 200.0000 mg | ORAL_CAPSULE | Freq: Three times a day (TID) | ORAL | 0 refills | Status: DC | PRN

## 2019-10-18 MED ORDER — HYDROCOD POLST-CPM POLST ER 10-8 MG/5ML PO SUER: 5.0000 mL | Freq: Every evening | ORAL | 0 refills | Status: DC | PRN

## 2019-10-18 MED ORDER — ALBUTEROL SULFATE HFA 108 (90 BASE) MCG/ACT IN AERS: 1.0000 | INHALATION_SPRAY | Freq: Four times a day (QID) | RESPIRATORY_TRACT | 2 refills | Status: DC | PRN

## 2019-10-18 MED ORDER — DM-GUAIFENESIN ER 60-1200 MG PO TB12: 1.0000 | ORAL_TABLET | Freq: Two times a day (BID) | ORAL | 0 refills | Status: DC

## 2019-10-18 NOTE — Telephone Encounter (Signed)
That's fine        You  Satomi, Worthen J 1 hour ago (10:52 AM)     If we send in any new medication we must bill your insurance for this  Are you agreeable to the fee?   Thank you for the update and I hope you get better soon   [place]        Elpidio Galea T, CMA routed conversation to You 1 hour ago (10:13 AM)    Rolanda Jay, [place]  You 2 hours ago (9:50 AM)     Been in hospital  we twice in 5 days for fluids and potassium but can't stop the cough the health department nurse suggest I ask you for prescription strength suppressant to be called in please        You  Aviella, Kohls 7 days ago     This is not the treatment for covid hydroxychloroquine  If you are getting worse please go to [facility] in [place] and if worse they will transfer you to [facility]  Please go today you may be dehydrated tell them you have COVID 19  This is safest thing to do   [facility] you feel better soon [place]        Elpidio Galea T, CMA routed conversation to You 7 days ago    Grasse, Lenelle Blakeslee  You 7 days ago      Well I thought I was getting better but the last two days my temp has elevated.i am very nausea and can,t .. eat or drink much. Last night I heard wheezing in my chest and can't sleep for more than a couple hours . Do you think if I started on some Hydroxy chloroquine or something similar this would help  .this is scarry stuff thanks Doc        You  Kwan, Berland 11 days ago     Yes please schedule appointment asap to discuss  I rec vitamin D 4000 IU daily  Zinc and vitamin C in a multivitamin I.e nature made  Tea with honey lemon Mucinex green label DM over the counter  You and contacts will need to stay in house x 14 days  Try to eat and drink   If cough getting worse we can have appt for albuterol inhaler and prescription cough agents  Try to do breathing exercises and walk around the house  Sleep on your abdomen or on your left side    TMS         Dorna Leitz, CMA  You 11 days ago   Please advise? You have some same day appointments. Patient was positive.   Message text     Mircale, Cheah  You 11 days ago     I have a question about NOVEL CORONAVIRUS, NAA resulted on 10/07/19 at 8:36 AM. Does this mean positive and if so what do I do now already my hubby and I have quarantined ourself I am taking vitamin c        A/P  COVID 19 + with cough and sob   Rx  Albuterol inhaler  tussionex  mucinex dm  Tessalon perles   Over the counter Recommend vitamin C 500 mg 2x per day  Zinc 75-100 mg daily  Vitamin D3 4000 IU daily  Tea with honey and lemon  Elderberry syrup     My chart message pt agreeable to fee  Mignon Time 5-10 minutes

## 2020-01-24 ENCOUNTER — Telehealth: Payer: Self-pay | Admitting: Internal Medicine

## 2020-01-24 NOTE — Telephone Encounter (Signed)
cologuard order expired does she want me to reorder?  She needs to do this on a Monday  [place]

## 2020-01-24 NOTE — Telephone Encounter (Signed)
Left message to return call 

## 2020-01-26 NOTE — Telephone Encounter (Signed)
No answer, no voicemail.  Mailbox full

## 2020-01-31 ENCOUNTER — Telehealth: Payer: Self-pay | Admitting: Internal Medicine

## 2020-01-31 NOTE — Telephone Encounter (Signed)
Left message to return call. Unable to reach patient 3 times. Sent mychart message along with a letter to address on file.

## 2020-01-31 NOTE — Telephone Encounter (Signed)
Noted  

## 2020-01-31 NOTE — Telephone Encounter (Signed)
Patient wanted to let Dr.Tracy know she is in the middle of have major dental work done. Appointment made in the next 4 weeks.

## 2020-03-24 ENCOUNTER — Encounter: Payer: Self-pay | Admitting: Internal Medicine

## 2020-03-24 ENCOUNTER — Other Ambulatory Visit: Payer: Self-pay

## 2020-03-24 ENCOUNTER — Ambulatory Visit (INDEPENDENT_AMBULATORY_CARE_PROVIDER_SITE_OTHER): Payer: Medicare Other | Admitting: Internal Medicine

## 2020-03-24 VITALS — BP 124/82 | HR 63 | Temp 97.0°F | Ht 59.0 in | Wt 218.4 lb

## 2020-03-24 DIAGNOSIS — H269 Unspecified cataract: Secondary | ICD-10-CM | POA: Diagnosis not present

## 2020-03-24 DIAGNOSIS — Z6841 Body Mass Index (BMI) 40.0 and over, adult: Secondary | ICD-10-CM

## 2020-03-24 DIAGNOSIS — G2581 Restless legs syndrome: Secondary | ICD-10-CM | POA: Diagnosis not present

## 2020-03-24 DIAGNOSIS — Z1329 Encounter for screening for other suspected endocrine disorder: Secondary | ICD-10-CM | POA: Diagnosis not present

## 2020-03-24 DIAGNOSIS — E559 Vitamin D deficiency, unspecified: Secondary | ICD-10-CM

## 2020-03-24 DIAGNOSIS — Z1231 Encounter for screening mammogram for malignant neoplasm of breast: Secondary | ICD-10-CM | POA: Diagnosis not present

## 2020-03-24 DIAGNOSIS — Z85828 Personal history of other malignant neoplasm of skin: Secondary | ICD-10-CM | POA: Insufficient documentation

## 2020-03-24 DIAGNOSIS — E876 Hypokalemia: Secondary | ICD-10-CM | POA: Diagnosis not present

## 2020-03-24 DIAGNOSIS — H547 Unspecified visual loss: Secondary | ICD-10-CM

## 2020-03-24 DIAGNOSIS — Z1283 Encounter for screening for malignant neoplasm of skin: Secondary | ICD-10-CM

## 2020-03-24 DIAGNOSIS — R7303 Prediabetes: Secondary | ICD-10-CM

## 2020-03-24 DIAGNOSIS — I10 Essential (primary) hypertension: Secondary | ICD-10-CM

## 2020-03-24 DIAGNOSIS — Z1389 Encounter for screening for other disorder: Secondary | ICD-10-CM

## 2020-03-24 DIAGNOSIS — D509 Iron deficiency anemia, unspecified: Secondary | ICD-10-CM | POA: Diagnosis not present

## 2020-03-24 LAB — CBC WITH DIFFERENTIAL/PLATELET
Basophils Absolute: 0 10*3/uL (ref 0.0–0.1)
Basophils Relative: 0.7 % (ref 0.0–3.0)
Eosinophils Absolute: 0.1 10*3/uL (ref 0.0–0.7)
Eosinophils Relative: 2.4 % (ref 0.0–5.0)
HCT: 40.6 % (ref 36.0–46.0)
Hemoglobin: 13.6 g/dL (ref 12.0–15.0)
Lymphocytes Relative: 24.3 % (ref 12.0–46.0)
Lymphs Abs: 1.3 10*3/uL (ref 0.7–4.0)
MCHC: 33.6 g/dL (ref 30.0–36.0)
MCV: 92.7 fl (ref 78.0–100.0)
Monocytes Absolute: 0.6 10*3/uL (ref 0.1–1.0)
Monocytes Relative: 11.5 % (ref 3.0–12.0)
Neutro Abs: 3.3 10*3/uL (ref 1.4–7.7)
Neutrophils Relative %: 61.1 % (ref 43.0–77.0)
Platelets: 264 10*3/uL (ref 150.0–400.0)
RBC: 4.38 Mil/uL (ref 3.87–5.11)
RDW: 13.7 % (ref 11.5–15.5)
WBC: 5.4 10*3/uL (ref 4.0–10.5)

## 2020-03-24 LAB — LIPID PANEL
Cholesterol: 226 mg/dL — ABNORMAL HIGH (ref 0–200)
HDL: 49.6 mg/dL (ref >39.00)
LDL Cholesterol: 145 mg/dL — ABNORMAL HIGH (ref 0–99)
NonHDL: 176.71
Total CHOL/HDL Ratio: 5
Triglycerides: 159 mg/dL — ABNORMAL HIGH (ref 0.0–149.0)
VLDL: 31.8 mg/dL (ref 0.0–40.0)

## 2020-03-24 LAB — COMPREHENSIVE METABOLIC PANEL
ALT: 21 U/L (ref 0–35)
AST: 24 U/L (ref 0–37)
Albumin: 4.2 g/dL (ref 3.5–5.2)
Alkaline Phosphatase: 55 U/L (ref 39–117)
BUN: 17 mg/dL (ref 6–23)
CO2: 32 mEq/L (ref 19–32)
Calcium: 9.5 mg/dL (ref 8.4–10.5)
Chloride: 98 mEq/L (ref 96–112)
Creatinine, Ser: 0.87 mg/dL (ref 0.40–1.20)
GFR: 64.63 mL/min (ref >60.00)
Glucose, Bld: 120 mg/dL — ABNORMAL HIGH (ref 70–99)
Potassium: 3.6 mEq/L (ref 3.5–5.1)
Sodium: 137 mEq/L (ref 135–145)
Total Bilirubin: 0.5 mg/dL (ref 0.2–1.2)
Total Protein: 8 g/dL (ref 6.0–8.3)

## 2020-03-24 LAB — MAGNESIUM: Magnesium: 2.1 mg/dL (ref 1.5–2.5)

## 2020-03-24 LAB — HEMOGLOBIN A1C: Hgb A1c MFr Bld: 6.3 % (ref 4.6–6.5)

## 2020-03-24 LAB — TSH: TSH: 1.28 u[IU]/mL (ref 0.35–4.50)

## 2020-03-24 LAB — VITAMIN D 25 HYDROXY (VIT D DEFICIENCY, FRACTURES): VITD: 45.12 ng/mL (ref 30.00–100.00)

## 2020-03-24 MED ORDER — ROPINIROLE HCL 0.25 MG PO TABS: 0.2500 mg | ORAL_TABLET | Freq: Every day | ORAL | 0 refills | Status: DC

## 2020-03-24 NOTE — Addendum Note (Signed)
Addended by: Orland Mustard on: 03/24/2020 05:46 PM   Modules accepted: Orders
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
## 2020-03-24 NOTE — Patient Instructions (Addendum)
Dermatology  [address] Dr. Kellie Moor    Calcium and vitamin D   For immune system Oil of oregano Elderberry  Multivitamin   womens rogaine  Giovannis shamppo tea tree  Skin, hair, nails supplement  Vital proteins collagen from target   Gold Bond Talc free powder and or clotrimazole 1% antifungal for redness (over the counter)   Restless Legs Syndrome Restless legs syndrome is a condition that causes uncomfortable feelings or sensations in the legs, especially while sitting or lying down. The sensations usually cause an overwhelming urge to move the legs. The arms can also sometimes be affected. The condition can range from mild to severe. The symptoms often interfere with a person's ability to sleep. What are the causes? The cause of this condition is not known. What increases the risk? The following factors may make you more likely to develop this condition:  Being older than 50.  Pregnancy.  Being a woman. In general, the condition is more common in women than in men.  A family history of the condition.  Having iron deficiency.  Overuse of caffeine, nicotine, or alcohol.  Certain medical conditions, such as kidney disease, Parkinson's disease, or nerve damage.  Certain medicines, such as those for high blood pressure, nausea, colds, allergies, depression, and some heart conditions. What are the signs or symptoms? The main symptom of this condition is uncomfortable sensations in the legs, such as:  Pulling.  Tingling.  Prickling.  Throbbing.  Crawling.  Burning. Usually, the sensations:  Affect both sides of the body.  Are worse when you sit or lie down.  Are worse at night. These may wake you up or make it difficult to fall asleep.  Make you have a strong urge to move your legs.  Are temporarily relieved by moving your legs. The arms can also be affected, but this is rare. People who have this condition often have tiredness during the day  because of their lack of sleep at night. How is this diagnosed? This condition may be diagnosed based on:  Your symptoms.  Blood tests. In some cases, you may be monitored in a sleep lab by a specialist (a sleep study). This can detect any disruptions in your sleep. How is this treated? This condition is treated by managing the symptoms. This may include:  Lifestyle changes, such as exercising, using relaxation techniques, and avoiding caffeine, alcohol, or tobacco.  Medicines. Anti-seizure medicines may be tried first. Follow these instructions at home:     General instructions  Take over-the-counter and prescription medicines only as told by your health care provider.  Use methods to help relieve the uncomfortable sensations, such as: ? Massaging your legs. ? Walking or stretching. ? Taking a cold or hot bath.  Keep all follow-up visits as told by your health care provider. This is important. Lifestyle  Practice good sleep habits. For example, go to bed and get up at the same time every day. Most adults should get 7-9 hours of sleep each night.  Exercise regularly. Try to get at least 30 minutes of exercise most days of the week.  Practice ways of relaxing, such as yoga or meditation.  Avoid caffeine and alcohol.  Do not use any products that contain nicotine or tobacco, such as cigarettes and e-cigarettes. If you need help quitting, ask your health care provider. Contact a health care provider if:  Your symptoms get worse or they do not improve with treatment. Summary  Restless legs syndrome is  a condition that causes uncomfortable feelings or sensations in the legs, especially while sitting or lying down.  The symptoms often interfere with a person's ability to sleep.  This condition is treated by managing the symptoms. You may need to make lifestyle changes or take medicines. This information is not intended to replace advice given to you by your health care  provider. Make sure you discuss any questions you have with your health care provider. Document Revised: 11/24/2017 Document Reviewed: 11/24/2017 Elsevier Patient Education  [place].  Ropinirole tablets What is this medicine? ROPINIROLE (roe PIN i role) is used to treat the symptoms of Parkinson's disease. It helps to improve muscle control and movement difficulties. It is also used for the treatment of Restless Legs Syndrome. This medicine may be used for other purposes; ask your health care provider or pharmacist if you have questions. COMMON BRAND NAME(S): Requip What should I tell my health care provider before I take this medicine? They need to know if you have any of these conditions:  heart disease  high blood pressure  kidney disease  liver disease  low blood pressure  narcolepsy  sleep apnea  an unusual or allergic reaction to ropinirole, other medicines, foods, dyes, or preservatives  pregnant or trying to get pregnant  breast-feeding How should I use this medicine? Take this medicine by mouth with a glass of water. Follow the directions on the prescription label. You can take it with or without food. If it upsets your stomach, take it with food. Take your doses at regular intervals. Do not take your medicine more often than directed. Do not stop taking this medicine except on your doctor's advice. Stopping this medicine too quickly may cause serious side effects. Talk to your pediatrician regarding the use of this medicine in children. Special care may be needed. Overdosage: If you think you have taken too much of this medicine contact a poison control center or emergency room at once. NOTE: This medicine is only for you. Do not share this medicine with others. What if I miss a dose? If you miss a dose, take it as soon as you can. If it is almost time for your next dose, take only that dose. Do not take double or extra doses. What may interact with this  medicine?  certain medicines for depression, mood, or psychotic disorders  ciprofloxacin  female hormones, like estrogens and birth control pills  fluvoxamine  metoclopramide  mexiletine  norfloxacin  omeprazole  rifampin This list may not describe all possible interactions. Give your health care provider a list of all the medicines, herbs, non-prescription drugs, or dietary supplements you use. Also tell them if you smoke, drink alcohol, or use illegal drugs. Some items may interact with your medicine. What should I watch for while using this medicine? Visit your health care professional for regular checks on your progress. Tell your health care professional if your symptoms do not start to get better or if they get worse. Do not stop taking except on your health care professional's advice. You may develop a severe reaction. Your health care professional will tell you how much medicine to take. You may get drowsy or dizzy. Do not drive, use machinery, or do anything that needs mental alertness until you know how this drug affects you. Do not stand or sit up quickly, especially if you are an older patient. This reduces the risk of dizzy or fainting spells. Alcohol may interfere with the effect of this  medicine. Avoid alcoholic drinks. When taking this medicine, you may fall asleep without notice. You may be doing activities like driving a car, talking, or eating. You may not feel drowsy before it happens. Contact your health care provider right away if this happens to you. There have been reports of increased sexual urges or other strong urges such as gambling while taking this medicine. If you experience any of these while taking this medicine, you should report this to your health care provider as soon as possible. Your mouth may get dry. Chewing sugarless gum or sucking hard candy and drinking plenty of water may help. Contact your health care professional if the problem does not go away  or is severe. You should check your skin often for changes to moles and new growths while taking this medicine. Call your doctor if you notice any of these changes. What side effects may I notice from receiving this medicine? Side effects that you should report to your doctor or health care professional as soon as possible:  allergic reactions like skin rash, itching or hives, swelling of the face, lips, or tongue  breathing problems  changes in emotions or moods  changes in vision  chest pain  confusion  falling asleep during normal activities like driving  fast, irregular heartbeat  hallucinations  joint or muscle pain  loss of bladder control  loss of memory  new or increased gambling urges, sexual urges, uncontrolled spending, binge or compulsive eating, or other urges  pain, tingling, numbness in the hands or feet  signs and symptoms of low blood pressure like dizziness; feeling faint or lightheaded, falls; unusually weak or tired  swelling of the ankles, feet, hands  uncontrollable movements of the arms, face, head, mouth, neck, or upper body  vomiting Side effects that usually do not require medical attention (report to your doctor or health care professional if they continue or are bothersome):  dizziness  drowsiness  headache  increased sweating  nausea This list may not describe all possible side effects. Call your doctor for medical advice about side effects. You may report side effects to FDA at 1-800-FDA-1088. Where should I keep my medicine? Keep out of the reach of children. Store at room temperature between 20 and 25 degrees C (68 and 77 degrees F). Protect from light and moisture. Keep container tightly closed. Throw away any unused medicine after the expiration date. NOTE: This sheet is a summary. It may not cover all possible information. If you have questions about this medicine, talk to your doctor, pharmacist, or health care provider.   2020 Elsevier/Gold Standard (2019-07-08 16:52:05)

## 2020-03-25 LAB — IRON,TIBC AND FERRITIN PANEL
%SAT: 27 % (calc) (ref 16–45)
Ferritin: 69 ng/mL (ref 16–288)
Iron: 101 ug/dL (ref 45–160)
TIBC: 373 mcg/dL (calc) (ref 250–450)

## 2020-04-05 ENCOUNTER — Other Ambulatory Visit: Payer: Self-pay | Admitting: Internal Medicine

## 2020-04-05 DIAGNOSIS — G2581 Restless legs syndrome: Secondary | ICD-10-CM

## 2020-04-10 ENCOUNTER — Telehealth: Payer: Self-pay | Admitting: Internal Medicine

## 2020-04-10 NOTE — Telephone Encounter (Signed)
Left message for patient to call back and schedule Medicare Annual Wellness Visit (AWV) either virtually or audio only.  Last AWV 02/19/18; please schedule at anytime with Denisa O'Brien-Blaney at [facility].

## 2020-04-12 DIAGNOSIS — H2511 Age-related nuclear cataract, right eye: Secondary | ICD-10-CM | POA: Diagnosis not present

## 2020-04-12 DIAGNOSIS — H35373 Puckering of macula, bilateral: Secondary | ICD-10-CM | POA: Diagnosis not present

## 2020-05-29 ENCOUNTER — Encounter: Payer: Self-pay | Admitting: Ophthalmology

## 2020-05-29 ENCOUNTER — Other Ambulatory Visit: Payer: Self-pay

## 2020-06-02 NOTE — Discharge Instructions (Signed)

## 2020-06-06 ENCOUNTER — Ambulatory Visit: Payer: Medicare Other | Admitting: Anesthesiology

## 2020-06-06 ENCOUNTER — Other Ambulatory Visit: Payer: Self-pay

## 2020-06-06 ENCOUNTER — Encounter
Admission: RE | Disposition: A | Discharge status: Home / Self Care | Payer: Self-pay | Source: Home / Self Care | Attending: Ophthalmology

## 2020-06-06 ENCOUNTER — Ambulatory Visit
Admission: RE | Admit: 2020-06-06 | Discharge: 2020-06-06 | Disposition: A | Discharge status: Home / Self Care | Payer: Medicare Other | Attending: Ophthalmology | Admitting: Ophthalmology

## 2020-06-06 DIAGNOSIS — H25812 Combined forms of age-related cataract, left eye: Secondary | ICD-10-CM | POA: Diagnosis not present

## 2020-06-06 DIAGNOSIS — E1136 Type 2 diabetes mellitus with diabetic cataract: Secondary | ICD-10-CM | POA: Insufficient documentation

## 2020-06-06 DIAGNOSIS — H2512 Age-related nuclear cataract, left eye: Secondary | ICD-10-CM | POA: Diagnosis not present

## 2020-06-06 DIAGNOSIS — Z79899 Other long term (current) drug therapy: Secondary | ICD-10-CM | POA: Insufficient documentation

## 2020-06-06 DIAGNOSIS — Z85828 Personal history of other malignant neoplasm of skin: Secondary | ICD-10-CM | POA: Diagnosis not present

## 2020-06-06 DIAGNOSIS — Z6841 Body Mass Index (BMI) 40.0 and over, adult: Secondary | ICD-10-CM | POA: Diagnosis not present

## 2020-06-06 DIAGNOSIS — I10 Essential (primary) hypertension: Secondary | ICD-10-CM | POA: Diagnosis not present

## 2020-06-06 HISTORY — DX: Prediabetes: R73.03

## 2020-06-06 HISTORY — DX: Motion sickness, initial encounter: T75.3XXA

## 2020-06-06 HISTORY — DX: Presence of dental prosthetic device (complete) (partial): Z97.2

## 2020-06-06 HISTORY — DX: Unspecified osteoarthritis, unspecified site: M19.90

## 2020-06-06 HISTORY — PX: CATARACT EXTRACTION W/PHACO: SHX586

## 2020-06-06 SURGERY — PHACOEMULSIFICATION, CATARACT, WITH IOL INSERTION
Anesthesia: Monitor Anesthesia Care | Site: Eye | Laterality: Left

## 2020-06-06 MED ORDER — BRIMONIDINE TARTRATE-TIMOLOL 0.2-0.5 % OP SOLN
OPHTHALMIC | Status: DC | PRN
  Administered 2020-06-06: 1 [drp] via OPHTHALMIC

## 2020-06-06 MED ORDER — LACTATED RINGERS IV SOLN: INTRAVENOUS | Status: DC

## 2020-06-06 MED ORDER — FENTANYL CITRATE (PF) 100 MCG/2ML IJ SOLN
INTRAMUSCULAR | Status: DC | PRN
  Administered 2020-06-06: 50 ug via INTRAVENOUS

## 2020-06-06 MED ORDER — ACETAMINOPHEN 325 MG PO TABS: 325.0000 mg | ORAL_TABLET | Freq: Once | ORAL | Status: DC

## 2020-06-06 MED ORDER — ACETAMINOPHEN 160 MG/5ML PO SOLN: 325.0000 mg | Freq: Once | ORAL | Status: DC

## 2020-06-06 MED ORDER — ARMC OPHTHALMIC DILATING DROPS
1.0000 "application " | OPHTHALMIC | Status: DC | PRN
  Administered 2020-06-06 (×3): 1 via OPHTHALMIC

## 2020-06-06 MED ORDER — LIDOCAINE HCL (PF) 2 % IJ SOLN
INTRAOCULAR | Status: DC | PRN
  Administered 2020-06-06: 2 mL

## 2020-06-06 MED ORDER — EPINEPHRINE PF 1 MG/ML IJ SOLN
INTRAOCULAR | Status: DC | PRN
  Administered 2020-06-06: 33 mL via OPHTHALMIC

## 2020-06-06 MED ORDER — MIDAZOLAM HCL 2 MG/2ML IJ SOLN
INTRAMUSCULAR | Status: DC | PRN
  Administered 2020-06-06: 1 mg via INTRAVENOUS

## 2020-06-06 MED ORDER — TETRACAINE HCL 0.5 % OP SOLN
1.0000 [drp] | OPHTHALMIC | Status: DC | PRN
  Administered 2020-06-06 (×3): 1 [drp] via OPHTHALMIC

## 2020-06-06 MED ORDER — NA CHONDROIT SULF-NA HYALURON 40-17 MG/ML IO SOLN
INTRAOCULAR | Status: DC | PRN
  Administered 2020-06-06: 1 mL via INTRAOCULAR

## 2020-06-06 MED ORDER — MOXIFLOXACIN HCL 0.5 % OP SOLN
OPHTHALMIC | Status: DC | PRN
  Administered 2020-06-06: 0.2 mL via OPHTHALMIC

## 2020-06-06 SURGICAL SUPPLY — 20 items
CANNULA ANT/CHMB 27G (MISCELLANEOUS) ×2 IMPLANT
CANNULA ANT/CHMB 27GA (MISCELLANEOUS) ×6 IMPLANT
GLOVE SURG LX 8.0 MICRO (GLOVE) ×2
GLOVE SURG LX STRL 8.0 MICRO (GLOVE) ×1 IMPLANT
GLOVE SURG TRIUMPH 8.0 PF LTX (GLOVE) ×3 IMPLANT
GOWN STRL REUS W/ TWL LRG LVL3 (GOWN DISPOSABLE) ×2 IMPLANT
GOWN STRL REUS W/TWL LRG LVL3 (GOWN DISPOSABLE) ×6
LENS IOL DIOP 21.5 (Intraocular Lens) ×3 IMPLANT
LENS IOL TECNIS MONO 21.5 (Intraocular Lens) IMPLANT
MARKER SKIN DUAL TIP RULER LAB (MISCELLANEOUS) ×3 IMPLANT
NDL FILTER BLUNT 18X1 1/2 (NEEDLE) ×1 IMPLANT
NEEDLE FILTER BLUNT 18X 1/2SAF (NEEDLE) ×2
NEEDLE FILTER BLUNT 18X1 1/2 (NEEDLE) ×1 IMPLANT
PACK EYE AFTER SURG (MISCELLANEOUS) ×3 IMPLANT
PACK OPTHALMIC (MISCELLANEOUS) ×3 IMPLANT
PACK PORFILIO (MISCELLANEOUS) ×3 IMPLANT
SYR 3ML LL SCALE MARK (SYRINGE) ×3 IMPLANT
SYR TB 1ML LUER SLIP (SYRINGE) ×3 IMPLANT
WATER STERILE IRR 250ML POUR (IV SOLUTION) ×3 IMPLANT
WIPE NON LINTING 3.25X3.25 (MISCELLANEOUS) ×3 IMPLANT

## 2020-06-06 NOTE — Op Note (Signed)
PREOPERATIVE DIAGNOSIS:  Nuclear sclerotic cataract of the left eye.   POSTOPERATIVE DIAGNOSIS:  Nuclear sclerotic cataract of the left eye.   OPERATIVE PROCEDURE:@   SURGEON:  Birder Robson, MD.   ANESTHESIA:  Anesthesiologist: Ronelle Nigh, MD CRNA: Cameron Ali, CRNA  1.      Managed anesthesia care. 2.     0.48ml of Shugarcaine was instilled following the paracentesis   COMPLICATIONS:  None.   TECHNIQUE:   Stop and chop   DESCRIPTION OF PROCEDURE:  The patient was examined and consented in the preoperative holding area where the aforementioned topical anesthesia was applied to the left eye and then brought back to the Operating Room where the left eye was prepped and draped in the usual sterile ophthalmic fashion and a lid speculum was placed. A paracentesis was created with the side port blade and the anterior chamber was filled with viscoelastic. A near clear corneal incision was performed with the steel keratome. A continuous curvilinear capsulorrhexis was performed with a cystotome followed by the capsulorrhexis forceps. Hydrodissection and hydrodelineation were carried out with BSS on a blunt cannula. The lens was removed in a stop and chop  technique and the remaining cortical material was removed with the irrigation-aspiration handpiece. The capsular bag was inflated with viscoelastic and the Technis ZCB00 lens was placed in the capsular bag without complication. The remaining viscoelastic was removed from the eye with the irrigation-aspiration handpiece. The wounds were hydrated. The anterior chamber was flushed with BSS and the eye was inflated to physiologic pressure. 0.104ml Vigamox was placed in the anterior chamber. The wounds were found to be water tight. The eye was dressed with Combigan. The patient was given protective glasses to wear throughout the day and a shield with which to sleep tonight. The patient was also given drops with which to begin a drop regimen today and  will follow-up with me in one day. Implant Name Type Inv. Item Serial No. Manufacturer Lot No. LRB No. Used Action  LENS IOL DIOP 21.5 - [id number] Intraocular Lens LENS IOL DIOP 21.5 [id number] [employer]  Left 1 Implanted    Procedure(s): CATARACT EXTRACTION PHACO AND INTRAOCULAR LENS PLACEMENT (IOC) LEFT 3.38 00:21.0 (Left)  Electronically signed: Birder Robson 06/06/2020 10:01 AM

## 2020-06-06 NOTE — Transfer of Care (Signed)
Immediate Anesthesia Transfer of Care Note  [name]  Procedure(s) Performed: CATARACT EXTRACTION PHACO AND INTRAOCULAR LENS PLACEMENT (IOC) LEFT 3.38 00:21.0 (Left Eye)  Patient Location: PACU  Anesthesia Type: MAC  Level of Consciousness: awake, alert  and patient cooperative  Airway and Oxygen Therapy: Patient Spontanous Breathing and Patient connected to supplemental oxygen  Post-op Assessment: Post-op Vital signs reviewed, Patient's Cardiovascular Status Stable, Respiratory Function Stable, Patent Airway and No signs of Nausea or vomiting  Post-op Vital Signs: Reviewed and stable  Complications: No complications documented.

## 2020-06-06 NOTE — Anesthesia Preprocedure Evaluation (Signed)
Anesthesia Evaluation  Patient identified by MRN, date of birth, ID band Patient awake    Reviewed: Allergy & Precautions, H&P , NPO status , Patient's Chart, lab work & pertinent test results  Airway Mallampati: II  TM Distance: >3 FB Neck ROM: full    Dental  (+) Upper Dentures   Pulmonary    Pulmonary exam normal breath sounds clear to auscultation       Cardiovascular hypertension, Normal cardiovascular exam Rhythm:regular Rate:Normal     Neuro/Psych    GI/Hepatic   Endo/Other  Morbid obesity  Renal/GU      Musculoskeletal   Abdominal   Peds  Hematology   Anesthesia Other Findings   Reproductive/Obstetrics                             Anesthesia Physical Anesthesia Plan  ASA: III  Anesthesia Plan: MAC   Post-op Pain Management:    Induction:   PONV Risk Score and Plan: 2 and Treatment may vary due to age or medical condition, TIVA and Midazolam  Airway Management Planned:   Additional Equipment:   Intra-op Plan:   Post-operative Plan:   Informed Consent: I have reviewed the patients History and Physical, chart, labs and discussed the procedure including the risks, benefits and alternatives for the proposed anesthesia with the patient or authorized representative who has indicated his/her understanding and acceptance.     Dental Advisory Given  Plan Discussed with: CRNA  Anesthesia Plan Comments:         Anesthesia Quick Evaluation

## 2020-06-06 NOTE — H&P (Signed)
All labs reviewed. Abnormal studies sent to patients PCP when indicated.  Previous H&P reviewed, patient examined, there are NO CHANGES.  Jennifer Erby Porfilio7/20/20219:38 AM

## 2020-06-06 NOTE — Anesthesia Procedure Notes (Signed)
Procedure Name: MAC Date/Time: 06/06/2020 9:45 AM Performed by: Cameron Ali, CRNA Pre-anesthesia Checklist: Patient identified, Emergency Drugs available, Suction available, Timeout performed and Patient being monitored Patient Re-evaluated:Patient Re-evaluated prior to induction Oxygen Delivery Method: Nasal cannula Placement Confirmation: positive ETCO2

## 2020-06-06 NOTE — Anesthesia Postprocedure Evaluation (Signed)
Anesthesia Post Note  [name]  Procedure(s) Performed: CATARACT EXTRACTION PHACO AND INTRAOCULAR LENS PLACEMENT (IOC) LEFT 3.38 00:21.0 (Left Eye)     Patient location during evaluation: PACU Anesthesia Type: MAC Level of consciousness: awake and alert and oriented Pain management: satisfactory to patient Vital Signs Assessment: post-procedure vital signs reviewed and stable Respiratory status: spontaneous breathing, nonlabored ventilation and respiratory function stable Cardiovascular status: blood pressure returned to baseline and stable Postop Assessment: Adequate PO intake and No signs of nausea or vomiting Anesthetic complications: no   No complications documented.  Raliegh Ip

## 2020-06-07 ENCOUNTER — Encounter: Payer: Self-pay | Admitting: Ophthalmology

## 2020-06-11 ENCOUNTER — Other Ambulatory Visit: Payer: Self-pay | Admitting: Internal Medicine

## 2020-06-11 DIAGNOSIS — I10 Essential (primary) hypertension: Secondary | ICD-10-CM

## 2020-06-11 MED ORDER — AMLODIPINE BESYLATE 5 MG PO TABS: 5.0000 mg | ORAL_TABLET | Freq: Every day | ORAL | 3 refills | Status: DC

## 2020-06-11 MED ORDER — ATENOLOL 50 MG PO TABS: 50.0000 mg | ORAL_TABLET | Freq: Every day | ORAL | 3 refills | Status: DC

## 2020-06-16 ENCOUNTER — Telehealth: Payer: Self-pay | Admitting: Internal Medicine

## 2020-06-16 DIAGNOSIS — I10 Essential (primary) hypertension: Secondary | ICD-10-CM

## 2020-06-16 MED ORDER — ATENOLOL 50 MG PO TABS: 50.0000 mg | ORAL_TABLET | Freq: Every day | ORAL | 3 refills | Status: DC

## 2020-06-16 MED ORDER — AMLODIPINE BESYLATE 5 MG PO TABS: 5.0000 mg | ORAL_TABLET | Freq: Every day | ORAL | 3 refills | Status: DC

## 2020-06-16 MED ORDER — HYDROCHLOROTHIAZIDE 25 MG PO TABS: 25.0000 mg | ORAL_TABLET | Freq: Every day | ORAL | 1 refills | Status: DC

## 2020-06-16 NOTE — Telephone Encounter (Signed)
Pt needs a refill on atenolol (TENORMIN) 50 MG tablet and amLODipine (NORVASC) 5 MG tablet and hydrochlorothiazide (HYDRODIURIL) 25 MG tablet sent to [facility]. 90 day supplies

## 2020-06-16 NOTE — Telephone Encounter (Signed)
Did the patient need medications  Atenolol  norvasc  hctz   Or any other [facility]  (member ID, name, DOB, address, phone #)   If so we need to and she needs to call [phone] update her information and resubmit all needed Rxs   Call patient and call [facility]   We will need to resubmit all needed Rx's to [facility] once this is done

## 2020-06-20 NOTE — Telephone Encounter (Signed)
Left message to return call 

## 2020-06-21 DIAGNOSIS — H2511 Age-related nuclear cataract, right eye: Secondary | ICD-10-CM | POA: Diagnosis not present

## 2020-06-21 DIAGNOSIS — E119 Type 2 diabetes mellitus without complications: Secondary | ICD-10-CM | POA: Diagnosis not present

## 2020-06-21 NOTE — Telephone Encounter (Signed)
Pt called returning your call 

## 2020-06-23 NOTE — Telephone Encounter (Signed)
Pt returning your call

## 2020-06-23 NOTE — Telephone Encounter (Signed)
Left message to call back  

## 2020-06-26 ENCOUNTER — Encounter: Payer: Self-pay | Admitting: Ophthalmology

## 2020-06-26 ENCOUNTER — Encounter: Payer: Self-pay | Admitting: Internal Medicine

## 2020-06-27 NOTE — Telephone Encounter (Signed)
[facility] and updated all of her information. Confirmed pharmacy switch with the Patient and updated her chart. Medications have been sent in to pharmacy 06/16/20 and receipt was confirmed.

## 2020-06-29 DIAGNOSIS — Z1231 Encounter for screening mammogram for malignant neoplasm of breast: Secondary | ICD-10-CM | POA: Diagnosis not present

## 2020-06-29 LAB — HM MAMMOGRAPHY

## 2020-06-30 ENCOUNTER — Other Ambulatory Visit: Payer: Self-pay | Admitting: Internal Medicine

## 2020-06-30 ENCOUNTER — Other Ambulatory Visit: Payer: Self-pay

## 2020-06-30 ENCOUNTER — Encounter: Payer: Self-pay | Admitting: Internal Medicine

## 2020-06-30 ENCOUNTER — Other Ambulatory Visit
Admission: RE | Admit: 2020-06-30 | Discharge: 2020-06-30 | Disposition: A | Discharge status: Home / Self Care | Payer: Medicare Other | Source: Ambulatory Visit | Attending: Pediatric Dentistry | Admitting: Pediatric Dentistry

## 2020-06-30 DIAGNOSIS — Z01812 Encounter for preprocedural laboratory examination: Secondary | ICD-10-CM | POA: Diagnosis not present

## 2020-06-30 DIAGNOSIS — Z20822 Contact with and (suspected) exposure to covid-19: Secondary | ICD-10-CM | POA: Diagnosis not present

## 2020-06-30 DIAGNOSIS — I10 Essential (primary) hypertension: Secondary | ICD-10-CM

## 2020-06-30 NOTE — Discharge Instructions (Signed)

## 2020-07-01 LAB — SARS CORONAVIRUS 2 (TAT 6-24 HRS): SARS Coronavirus 2: NEGATIVE

## 2020-07-04 ENCOUNTER — Other Ambulatory Visit: Payer: Self-pay

## 2020-07-04 ENCOUNTER — Encounter
Admission: RE | Disposition: A | Discharge status: Home / Self Care | Payer: Self-pay | Source: Home / Self Care | Attending: Ophthalmology

## 2020-07-04 ENCOUNTER — Ambulatory Visit: Payer: Medicare Other | Admitting: Anesthesiology

## 2020-07-04 ENCOUNTER — Encounter: Payer: Self-pay | Admitting: Ophthalmology

## 2020-07-04 ENCOUNTER — Ambulatory Visit
Admission: RE | Admit: 2020-07-04 | Discharge: 2020-07-04 | Disposition: A | Discharge status: Home / Self Care | Payer: Medicare Other | Attending: Ophthalmology | Admitting: Ophthalmology

## 2020-07-04 DIAGNOSIS — Z79899 Other long term (current) drug therapy: Secondary | ICD-10-CM | POA: Insufficient documentation

## 2020-07-04 DIAGNOSIS — H25811 Combined forms of age-related cataract, right eye: Secondary | ICD-10-CM | POA: Diagnosis not present

## 2020-07-04 DIAGNOSIS — I10 Essential (primary) hypertension: Secondary | ICD-10-CM | POA: Diagnosis not present

## 2020-07-04 DIAGNOSIS — Z885 Allergy status to narcotic agent status: Secondary | ICD-10-CM | POA: Insufficient documentation

## 2020-07-04 DIAGNOSIS — M1990 Unspecified osteoarthritis, unspecified site: Secondary | ICD-10-CM | POA: Diagnosis not present

## 2020-07-04 DIAGNOSIS — Z888 Allergy status to other drugs, medicaments and biological substances status: Secondary | ICD-10-CM | POA: Insufficient documentation

## 2020-07-04 DIAGNOSIS — E1136 Type 2 diabetes mellitus with diabetic cataract: Secondary | ICD-10-CM | POA: Diagnosis not present

## 2020-07-04 DIAGNOSIS — Z9842 Cataract extraction status, left eye: Secondary | ICD-10-CM | POA: Insufficient documentation

## 2020-07-04 DIAGNOSIS — Z6841 Body Mass Index (BMI) 40.0 and over, adult: Secondary | ICD-10-CM | POA: Insufficient documentation

## 2020-07-04 DIAGNOSIS — H2511 Age-related nuclear cataract, right eye: Secondary | ICD-10-CM | POA: Diagnosis not present

## 2020-07-04 DIAGNOSIS — Z884 Allergy status to anesthetic agent status: Secondary | ICD-10-CM | POA: Insufficient documentation

## 2020-07-04 HISTORY — PX: CATARACT EXTRACTION W/PHACO: SHX586

## 2020-07-04 SURGERY — PHACOEMULSIFICATION, CATARACT, WITH IOL INSERTION
Anesthesia: Monitor Anesthesia Care | Site: Eye | Laterality: Right

## 2020-07-04 MED ORDER — MOXIFLOXACIN HCL 0.5 % OP SOLN
OPHTHALMIC | Status: DC | PRN
  Administered 2020-07-04: 0.2 mL via OPHTHALMIC

## 2020-07-04 MED ORDER — ACETAMINOPHEN 160 MG/5ML PO SOLN: 325.0000 mg | ORAL | Status: DC | PRN

## 2020-07-04 MED ORDER — ACETAMINOPHEN 325 MG PO TABS: 325.0000 mg | ORAL_TABLET | ORAL | Status: DC | PRN

## 2020-07-04 MED ORDER — MIDAZOLAM HCL 2 MG/2ML IJ SOLN
INTRAMUSCULAR | Status: DC | PRN
  Administered 2020-07-04: 2 mg via INTRAVENOUS

## 2020-07-04 MED ORDER — TETRACAINE HCL 0.5 % OP SOLN
1.0000 [drp] | OPHTHALMIC | Status: DC | PRN
  Administered 2020-07-04 (×3): 1 [drp] via OPHTHALMIC

## 2020-07-04 MED ORDER — NA CHONDROIT SULF-NA HYALURON 40-17 MG/ML IO SOLN
INTRAOCULAR | Status: DC | PRN
  Administered 2020-07-04: 1 mL via INTRAOCULAR

## 2020-07-04 MED ORDER — LACTATED RINGERS IV SOLN: INTRAVENOUS | Status: DC

## 2020-07-04 MED ORDER — LIDOCAINE HCL (PF) 2 % IJ SOLN
INTRAOCULAR | Status: DC | PRN
  Administered 2020-07-04: 2 mL

## 2020-07-04 MED ORDER — EPINEPHRINE PF 1 MG/ML IJ SOLN
INTRAOCULAR | Status: DC | PRN
  Administered 2020-07-04: 45 mL via OPHTHALMIC

## 2020-07-04 MED ORDER — BRIMONIDINE TARTRATE-TIMOLOL 0.2-0.5 % OP SOLN
OPHTHALMIC | Status: DC | PRN
  Administered 2020-07-04: 1 [drp] via OPHTHALMIC

## 2020-07-04 MED ORDER — FENTANYL CITRATE (PF) 100 MCG/2ML IJ SOLN
INTRAMUSCULAR | Status: DC | PRN
  Administered 2020-07-04: 100 ug via INTRAVENOUS

## 2020-07-04 MED ORDER — ARMC OPHTHALMIC DILATING DROPS
1.0000 "application " | OPHTHALMIC | Status: DC | PRN
  Administered 2020-07-04 (×3): 1 via OPHTHALMIC

## 2020-07-04 MED ORDER — ONDANSETRON HCL 4 MG/2ML IJ SOLN: 4.0000 mg | Freq: Once | INTRAMUSCULAR | Status: DC | PRN

## 2020-07-04 SURGICAL SUPPLY — 20 items
CANNULA ANT/CHMB 27G (MISCELLANEOUS) ×2 IMPLANT
CANNULA ANT/CHMB 27GA (MISCELLANEOUS) ×6 IMPLANT
GLOVE SURG LX 8.0 MICRO (GLOVE) ×2
GLOVE SURG LX STRL 8.0 MICRO (GLOVE) ×1 IMPLANT
GLOVE SURG TRIUMPH 8.0 PF LTX (GLOVE) ×3 IMPLANT
GOWN STRL REUS W/ TWL LRG LVL3 (GOWN DISPOSABLE) ×2 IMPLANT
GOWN STRL REUS W/TWL LRG LVL3 (GOWN DISPOSABLE) ×6
LENS IOL DIOP 22.5 (Intraocular Lens) ×3 IMPLANT
LENS IOL TECNIS MONO 22.5 (Intraocular Lens) IMPLANT
MARKER SKIN DUAL TIP RULER LAB (MISCELLANEOUS) ×3 IMPLANT
NDL FILTER BLUNT 18X1 1/2 (NEEDLE) ×1 IMPLANT
NEEDLE FILTER BLUNT 18X 1/2SAF (NEEDLE) ×2
NEEDLE FILTER BLUNT 18X1 1/2 (NEEDLE) ×1 IMPLANT
PACK EYE AFTER SURG (MISCELLANEOUS) ×3 IMPLANT
PACK OPTHALMIC (MISCELLANEOUS) ×3 IMPLANT
PACK PORFILIO (MISCELLANEOUS) ×3 IMPLANT
SYR 3ML LL SCALE MARK (SYRINGE) ×3 IMPLANT
SYR TB 1ML LUER SLIP (SYRINGE) ×3 IMPLANT
WATER STERILE IRR 250ML POUR (IV SOLUTION) ×3 IMPLANT
WIPE NON LINTING 3.25X3.25 (MISCELLANEOUS) ×3 IMPLANT

## 2020-07-04 NOTE — Anesthesia Postprocedure Evaluation (Signed)
Anesthesia Post Note  [name]  Procedure(s) Performed: CATARACT EXTRACTION PHACO AND INTRAOCULAR LENS PLACEMENT (IOC) RIGHT 6.03 00:45.3 (Right Eye)     Patient location during evaluation: PACU Anesthesia Type: MAC Level of consciousness: awake and alert Pain management: pain level controlled Vital Signs Assessment: post-procedure vital signs reviewed and stable Respiratory status: spontaneous breathing, nonlabored ventilation, respiratory function stable and patient connected to nasal cannula oxygen Cardiovascular status: stable and blood pressure returned to baseline Postop Assessment: no apparent nausea or vomiting Anesthetic complications: no   No complications documented.  Sinda Du

## 2020-07-04 NOTE — Op Note (Signed)
PREOPERATIVE DIAGNOSIS:  Nuclear sclerotic cataract of the right eye.   POSTOPERATIVE DIAGNOSIS:  H25.11 Cataract   OPERATIVE PROCEDURE:@   SURGEON:  Birder Robson, MD.   ANESTHESIA:  Anesthesiologist: Sinda Du, MD CRNA: Silvana Newness, CRNA  1.      Managed anesthesia care. 2.      0.56ml of Shugarcaine was instilled in the eye following the paracentesis.   COMPLICATIONS:  None.   TECHNIQUE:   Stop and chop   DESCRIPTION OF PROCEDURE:  The patient was examined and consented in the preoperative holding area where the aforementioned topical anesthesia was applied to the right eye and then brought back to the Operating Room where the right eye was prepped and draped in the usual sterile ophthalmic fashion and a lid speculum was placed. A paracentesis was created with the side port blade and the anterior chamber was filled with viscoelastic. A near clear corneal incision was performed with the steel keratome. A continuous curvilinear capsulorrhexis was performed with a cystotome followed by the capsulorrhexis forceps. Hydrodissection and hydrodelineation were carried out with BSS on a blunt cannula. The lens was removed in a stop and chop  technique and the remaining cortical material was removed with the irrigation-aspiration handpiece. The capsular bag was inflated with viscoelastic and the Technis ZCB00  lens was placed in the capsular bag without complication. The remaining viscoelastic was removed from the eye with the irrigation-aspiration handpiece. The wounds were hydrated. The anterior chamber was flushed with BSS and the eye was inflated to physiologic pressure. 0.23ml of Vigamox was placed in the anterior chamber. The wounds were found to be water tight. The eye was dressed with Combigan. The patient was given protective glasses to wear throughout the day and a shield with which to sleep tonight. The patient was also given drops with which to begin a drop regimen today and will  follow-up with me in one day. Implant Name Type Inv. Item Serial No. Manufacturer Lot No. LRB No. Used Action  LENS IOL DIOP 22.5 - [id number] Intraocular Lens LENS IOL DIOP 22.5 [id number] [employer]  Right 1 Implanted   Procedure(s): CATARACT EXTRACTION PHACO AND INTRAOCULAR LENS PLACEMENT (IOC) RIGHT 6.03 00:45.3 (Right)  Electronically signed: Birder Robson 07/04/2020 11:57 AM

## 2020-07-04 NOTE — Transfer of Care (Signed)
Immediate Anesthesia Transfer of Care Note  [name]  Procedure(s) Performed: CATARACT EXTRACTION PHACO AND INTRAOCULAR LENS PLACEMENT (IOC) RIGHT (Right Eye)  Patient Location: PACU  Anesthesia Type: MAC  Level of Consciousness: awake, alert  and patient cooperative  Airway and Oxygen Therapy: Patient Spontanous Breathing and Patient connected to supplemental oxygen  Post-op Assessment: Post-op Vital signs reviewed, Patient's Cardiovascular Status Stable, Respiratory Function Stable, Patent Airway and No signs of Nausea or vomiting  Post-op Vital Signs: Reviewed and stable  Complications: No complications documented.

## 2020-07-04 NOTE — Anesthesia Preprocedure Evaluation (Signed)
Anesthesia Evaluation  Patient identified by MRN, date of birth, ID band Patient awake    Reviewed: Allergy & Precautions, H&P , NPO status , Patient's Chart, lab work & pertinent test results  Airway Mallampati: II  TM Distance: >3 FB Neck ROM: full    Dental  (+) Upper Dentures   Pulmonary    Pulmonary exam normal breath sounds clear to auscultation       Cardiovascular Exercise Tolerance: Good hypertension, Normal cardiovascular exam Rhythm:regular Rate:Normal     Neuro/Psych    GI/Hepatic   Endo/Other  Morbid obesity  Renal/GU      Musculoskeletal  (+) Arthritis ,   Abdominal   Peds  Hematology   Anesthesia Other Findings   Reproductive/Obstetrics                             Anesthesia Physical  Anesthesia Plan  ASA: III  Anesthesia Plan: MAC   Post-op Pain Management:    Induction:   PONV Risk Score and Plan: 2 and Treatment may vary due to age or medical condition, TIVA and Midazolam  Airway Management Planned: Nasal Cannula and Natural Airway  Additional Equipment:   Intra-op Plan:   Post-operative Plan:   Informed Consent: I have reviewed the patients History and Physical, chart, labs and discussed the procedure including the risks, benefits and alternatives for the proposed anesthesia with the patient or authorized representative who has indicated his/her understanding and acceptance.     Dental Advisory Given  Plan Discussed with: CRNA  Anesthesia Plan Comments:         Anesthesia Quick Evaluation Patient Active Problem List   Diagnosis Date Noted  . RLS (restless legs syndrome) 03/24/2020  . History of skin cancer 03/24/2020  . Morbid obesity with BMI of 40.0-44.9, adult ([place]) 03/24/2020  . COVID-19 virus detected 10/07/2019  . Cataracts, bilateral   . Hemorrhoids 01/22/2019  . Chronic pain of left knee 12/24/2018  . Thyroid nodule 12/24/2018   . Prediabetes 02/26/2018  . Vitamin D deficiency 02/26/2018  . Abdominal bloating 02/26/2018  . Hypokalemia 06/15/2017  . Elevated LFTs 06/15/2017  . Hyperlipidemia 05/29/2017  . Skin tags, multiple acquired 05/29/2017  . Severe obesity (BMI >= 40) ([place]) 12/23/2013  . Essential hypertension 09/20/2012    CBC Latest Ref Rng & Units 03/24/2020 10/15/2019 10/12/2019  WBC 4.0 - 10.5 K/uL 5.4 3.9(L) 4.6  Hemoglobin 12.0 - 15.0 g/dL 13.6 14.4 15.0  Hematocrit 36 - 46 % 40.6 41.9 43.3  Platelets 150 - 400 K/uL 264.0 190 156   BMP Latest Ref Rng & Units 03/24/2020 10/15/2019 10/13/2019  Glucose 70 - 99 mg/dL 120(H) 123(H) 128(H)  BUN 6 - 23 mg/dL _0 Creatinine 0.40 - 1.20 mg/dL 0.87 0.88 0.85  Sodium 135 - 145 mEq/L 137 135 135  Potassium 3.5 - 5.1 mEq/L 3.6 3.0(L) 3.9  Chloride 96 - 112 mEq/L 98 91(L) 94(L)  CO2 19 - 32 mEq/L 32 30 28  Calcium 8.4 - 10.5 mg/dL 9.5 8.3(L) 8.4(L)    Risks and benefits of anesthesia discussed at length, patient or surrogate demonstrates understanding. Appropriately NPO. Plan to proceed with anesthesia.  Champ Mungo, MD 07/04/20

## 2020-07-04 NOTE — H&P (Signed)
All labs reviewed. Abnormal studies sent to patients PCP when indicated.  Previous H&P reviewed, patient examined, there are NO CHANGES.  Jennifer Bilton Porfilio8/17/202111:33 AM

## 2020-07-04 NOTE — Anesthesia Procedure Notes (Signed)
Procedure Name: MAC Date/Time: 07/04/2020 11:44 AM Performed by: Silvana Newness, CRNA Pre-anesthesia Checklist: Patient identified, Emergency Drugs available, Suction available, Patient being monitored and Timeout performed Patient Re-evaluated:Patient Re-evaluated prior to induction Oxygen Delivery Method: Nasal cannula Placement Confirmation: positive ETCO2

## 2020-07-05 ENCOUNTER — Encounter: Payer: Self-pay | Admitting: Ophthalmology

## 2020-07-05 ENCOUNTER — Telehealth: Payer: Self-pay | Admitting: Internal Medicine

## 2020-07-05 DIAGNOSIS — I10 Essential (primary) hypertension: Secondary | ICD-10-CM

## 2020-07-05 MED ORDER — ATENOLOL 50 MG PO TABS: 50.0000 mg | ORAL_TABLET | Freq: Every day | ORAL | 3 refills | Status: DC

## 2020-07-05 MED ORDER — HYDROCHLOROTHIAZIDE 25 MG PO TABS: 25.0000 mg | ORAL_TABLET | Freq: Every day | ORAL | 1 refills | Status: DC

## 2020-07-05 MED ORDER — AMLODIPINE BESYLATE 5 MG PO TABS: 5.0000 mg | ORAL_TABLET | Freq: Every day | ORAL | 3 refills | Status: DC

## 2020-07-05 NOTE — Telephone Encounter (Signed)
Patient calling in and states she only has a few pills of her medications left. States [facility] has not sent this out to her as of yet.   [facility] and spoke with Candis  who is designated to the Patient's case. She states that they never received the medications sent in 06/16/20.   Re-sent medications.   Left message informing the patient of the above.

## 2020-07-26 DIAGNOSIS — D2330 Other benign neoplasm of skin of unspecified part of face: Secondary | ICD-10-CM | POA: Diagnosis not present

## 2020-08-02 ENCOUNTER — Other Ambulatory Visit: Payer: Self-pay | Admitting: Internal Medicine

## 2020-08-02 ENCOUNTER — Telehealth: Payer: Self-pay | Admitting: Internal Medicine

## 2020-08-02 DIAGNOSIS — I10 Essential (primary) hypertension: Secondary | ICD-10-CM

## 2020-08-02 MED ORDER — TRIAMTERENE-HCTZ 37.5-25 MG PO TABS
1.0000 | ORAL_TABLET | Freq: Every day | ORAL | 3 refills | Status: DC
Start: 2020-08-02 — End: 2021-06-29

## 2020-08-02 MED ORDER — TRIAMTERENE-HCTZ 37.5-25 MG PO TABS: 1.0000 | ORAL_TABLET | Freq: Every day | ORAL | 3 refills | Status: DC

## 2020-08-02 NOTE — Telephone Encounter (Signed)
Call pt review BP meds and what is BP? Should be on norvasc 5mg  qd  Atenolol 50 mg qd  maxzide 37.5-25 make sure she has this and not just hctz 25 this will keep K up  Fasting for appt upcoming for labs    Thanks tMS

## 2020-08-02 NOTE — Telephone Encounter (Signed)
Left message to return call 

## 2020-08-03 NOTE — Telephone Encounter (Signed)
Left message to return call 

## 2020-08-04 NOTE — Telephone Encounter (Signed)
Left message to return call. 3rd attempt, message sent on mychart.

## 2020-09-27 ENCOUNTER — Ambulatory Visit (INDEPENDENT_AMBULATORY_CARE_PROVIDER_SITE_OTHER): Payer: Medicare Other

## 2020-09-27 ENCOUNTER — Ambulatory Visit (INDEPENDENT_AMBULATORY_CARE_PROVIDER_SITE_OTHER): Payer: Medicare Other | Admitting: Internal Medicine

## 2020-09-27 ENCOUNTER — Encounter: Payer: Self-pay | Admitting: Internal Medicine

## 2020-09-27 ENCOUNTER — Other Ambulatory Visit: Payer: Self-pay

## 2020-09-27 VITALS — BP 124/80 | HR 61 | Temp 98.4°F | Ht 59.0 in | Wt 222.2 lb

## 2020-09-27 DIAGNOSIS — G2581 Restless legs syndrome: Secondary | ICD-10-CM

## 2020-09-27 DIAGNOSIS — B351 Tinea unguium: Secondary | ICD-10-CM | POA: Diagnosis not present

## 2020-09-27 DIAGNOSIS — Z0184 Encounter for antibody response examination: Secondary | ICD-10-CM

## 2020-09-27 DIAGNOSIS — E119 Type 2 diabetes mellitus without complications: Secondary | ICD-10-CM | POA: Diagnosis not present

## 2020-09-27 DIAGNOSIS — K760 Fatty (change of) liver, not elsewhere classified: Secondary | ICD-10-CM | POA: Diagnosis not present

## 2020-09-27 DIAGNOSIS — Z1159 Encounter for screening for other viral diseases: Secondary | ICD-10-CM

## 2020-09-27 DIAGNOSIS — H9313 Tinnitus, bilateral: Secondary | ICD-10-CM

## 2020-09-27 DIAGNOSIS — R7303 Prediabetes: Secondary | ICD-10-CM

## 2020-09-27 DIAGNOSIS — Z23 Encounter for immunization: Secondary | ICD-10-CM

## 2020-09-27 DIAGNOSIS — M545 Low back pain, unspecified: Secondary | ICD-10-CM | POA: Diagnosis not present

## 2020-09-27 DIAGNOSIS — M5441 Lumbago with sciatica, right side: Secondary | ICD-10-CM

## 2020-09-27 DIAGNOSIS — I10 Essential (primary) hypertension: Secondary | ICD-10-CM | POA: Diagnosis not present

## 2020-09-27 DIAGNOSIS — Z6841 Body Mass Index (BMI) 40.0 and over, adult: Secondary | ICD-10-CM

## 2020-09-27 MED ORDER — ROPINIROLE HCL 0.25 MG PO TABS: 0.2500 mg | ORAL_TABLET | Freq: Every day | ORAL | 0 refills | Status: DC
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
## 2020-09-27 NOTE — Patient Instructions (Addendum)
Think about medical weight loss clinic in [place] Consider prevnar vaccine and moderna vaccine   Back Exercises The following exercises strengthen the muscles that help to support the trunk and back. They also help to keep the lower back flexible. Doing these exercises can help to prevent back pain or lessen existing pain.  If you have back pain or discomfort, try doing these exercises 2-3 times each day or as told by your health care provider.  As your pain improves, do them once each day, but increase the number of times that you repeat the steps for each exercise (do more repetitions).  To prevent the recurrence of back pain, continue to do these exercises once each day or as told by your health care provider. Do exercises exactly as told by your health care provider and adjust them as directed. It is normal to feel mild stretching, pulling, tightness, or discomfort as you do these exercises, but you should stop right away if you feel sudden pain or your pain gets worse. Exercises Single knee to chest Repeat these steps 3-5 times for each leg: 1. Lie on your back on a firm bed or the floor with your legs extended. 2. Bring one knee to your chest. Your other leg should stay extended and in contact with the floor. 3. Hold your knee in place by grabbing your knee or thigh with both hands and hold. 4. Pull on your knee until you feel a gentle stretch in your lower back or buttocks. 5. Hold the stretch for 10-30 seconds. 6. Slowly release and straighten your leg. Pelvic tilt Repeat these steps 5-10 times: 1. Lie on your back on a firm bed or the floor with your legs extended. 2. Bend your knees so they are pointing toward the ceiling and your feet are flat on the floor. 3. Tighten your lower abdominal muscles to press your lower back against the floor. This motion will tilt your pelvis so your tailbone points up toward the ceiling instead of pointing to your feet or the floor. 4. With  gentle tension and even breathing, hold this position for 5-10 seconds. Cat-cow Repeat these steps until your lower back becomes more flexible: 1. Get into a hands-and-knees position on a firm surface. Keep your hands under your shoulders, and keep your knees under your hips. You may place padding under your knees for comfort. 2. Let your head hang down toward your chest. Contract your abdominal muscles and point your tailbone toward the floor so your lower back becomes rounded like the back of a cat. 3. Hold this position for 5 seconds. 4. Slowly lift your head, let your abdominal muscles relax and point your tailbone up toward the ceiling so your back forms a sagging arch like the back of a cow. 5. Hold this position for 5 seconds.  Press-ups Repeat these steps 5-10 times: 1. Lie on your abdomen (face-down) on the floor. 2. Place your palms near your head, about shoulder-width apart. 3. Keeping your back as relaxed as possible and keeping your hips on the floor, slowly straighten your arms to raise the top half of your body and lift your shoulders. Do not use your back muscles to raise your upper torso. You may adjust the placement of your hands to make yourself more comfortable. 4. Hold this position for 5 seconds while you keep your back relaxed. 5. Slowly return to lying flat on the floor.  Bridges Repeat these steps 10 times: 1. Lie on your back  on a firm surface. 2. Bend your knees so they are pointing toward the ceiling and your feet are flat on the floor. Your arms should be flat at your sides, next to your body. 3. Tighten your buttocks muscles and lift your buttocks off the floor until your waist is at almost the same height as your knees. You should feel the muscles working in your buttocks and the back of your thighs. If you do not feel these muscles, slide your feet 1-2 inches farther away from your buttocks. 4. Hold this position for 3-5 seconds. 5. Slowly lower your hips to the  starting position, and allow your buttocks muscles to relax completely. If this exercise is too easy, try doing it with your arms crossed over your chest. Abdominal crunches Repeat these steps 5-10 times: 1. Lie on your back on a firm bed or the floor with your legs extended. 2. Bend your knees so they are pointing toward the ceiling and your feet are flat on the floor. 3. Cross your arms over your chest. 4. Tip your chin slightly toward your chest without bending your neck. 5. Tighten your abdominal muscles and slowly raise your trunk (torso) high enough to lift your shoulder blades a tiny bit off the floor. Avoid raising your torso higher than that because it can put too much stress on your low back and does not help to strengthen your abdominal muscles. 6. Slowly return to your starting position. Back lifts Repeat these steps 5-10 times: 1. Lie on your abdomen (face-down) with your arms at your sides, and rest your forehead on the floor. 2. Tighten the muscles in your legs and your buttocks. 3. Slowly lift your chest off the floor while you keep your hips pressed to the floor. Keep the back of your head in line with the curve in your back. Your eyes should be looking at the floor. 4. Hold this position for 3-5 seconds. 5. Slowly return to your starting position. Contact a health care provider if:  Your back pain or discomfort gets much worse when you do an exercise.  Your worsening back pain or discomfort does not lessen within 2 hours after you exercise. If you have any of these problems, stop doing these exercises right away. Do not do them again unless your health care provider says that you can. Get help right away if:  You develop sudden, severe back pain. If this happens, stop doing the exercises right away. Do not do them again unless your health care provider says that you can. This information is not intended to replace advice given to you by your health care provider. Make sure  you discuss any questions you have with your health care provider. Document Revised: 03/11/2019 Document Reviewed: 08/06/2018 Elsevier Patient Education  [employer].   Liraglutide injection (Weight Management) What is this medicine? LIRAGLUTIDE (LIR a GLOO tide) is used to help people lose weight and maintain weight loss. It is used with a reduced-calorie diet and exercise. This medicine may be used for other purposes; ask your health care provider or pharmacist if you have questions. COMMON BRAND NAME(S): Saxenda What should I tell my health care provider before I take this medicine? They need to know if you have any of these conditions:  endocrine tumors (MEN 2) or if someone in your family had these tumors  gallbladder disease  high cholesterol  history of alcohol abuse problem  history of pancreatitis  kidney disease or if you are  on dialysis  liver disease  previous swelling of the tongue, face, or lips with difficulty breathing, difficulty swallowing, hoarseness, or tightening of the throat  stomach problems  suicidal thoughts, plans, or attempt; a previous suicide attempt by you or a family member  thyroid cancer or if someone in your family had thyroid cancer  an unusual or allergic reaction to liraglutide, other medicines, foods, dyes, or preservatives  pregnant or trying to get pregnant  breast-feeding How should I use this medicine? This medicine is for injection under the skin of your upper leg, stomach area, or upper arm. You will be taught how to prepare and give this medicine. Use exactly as directed. Take your medicine at regular intervals. Do not take it more often than directed. This drug comes with INSTRUCTIONS FOR USE. Ask your pharmacist for directions on how to use this drug. Read the information carefully. Talk to your pharmacist or health care provider if you have questions. It is important that you put your used needles and syringes in a  special sharps container. Do not put them in a trash can. If you do not have a sharps container, call your pharmacist or healthcare provider to get one. A special MedGuide will be given to you by the pharmacist with each prescription and refill. Be sure to read this information carefully each time. Talk to your pediatrician regarding the use of this medicine in children. Special care may be needed. Overdosage: If you think you have taken too much of this medicine contact a poison control center or emergency room at once. NOTE: This medicine is only for you. Do not share this medicine with others. What if I miss a dose? If you miss a dose, take it as soon as you can. If it is almost time for your next dose, take only that dose. Do not take double or extra doses. If you miss your dose for 3 days or more, call your doctor or health care professional to talk about how to restart this medicine. What may interact with this medicine?  insulin and other medicines for diabetes This list may not describe all possible interactions. Give your health care provider a list of all the medicines, herbs, non-prescription drugs, or dietary supplements you use. Also tell them if you smoke, drink alcohol, or use illegal drugs. Some items may interact with your medicine. What should I watch for while using this medicine? Visit your doctor or health care professional for regular checks on your progress. Drink plenty of fluids while taking this medicine. Check with your doctor or health care professional if you get an attack of severe diarrhea, nausea, and vomiting. The loss of too much body fluid can make it dangerous for you to take this medicine. This medicine may affect blood sugar levels. Ask your healthcare provider if changes in diet or medicines are needed if you have diabetes. Patients and their families should watch out for worsening depression or thoughts of suicide. Also watch out for sudden changes in feelings  such as feeling anxious, agitated, panicky, irritable, hostile, aggressive, impulsive, severely restless, overly excited and hyperactive, or not being able to sleep. If this happens, especially at the beginning of treatment or after a change in dose, call your health care professional. Women should inform their health care provider if they wish to become pregnant or think they might be pregnant. Losing weight while pregnant is not advised and may cause harm to the unborn child. Talk to your health care  provider for more information. What side effects may I notice from receiving this medicine? Side effects that you should report to your doctor or health care professional as soon as possible:  allergic reactions like skin rash, itching or hives, swelling of the face, lips, or tongue  breathing problems  diarrhea that continues or is severe  lump or swelling on the neck  severe nausea  signs and symptoms of infection like fever or chills; cough; sore throat; pain or trouble passing urine  signs and symptoms of low blood sugar such as feeling anxious; confusion; dizziness; increased hunger; unusually weak or tired; increased sweating; shakiness; cold, clammy skin; irritable; headache; blurred vision; fast heartbeat; loss of consciousness  signs and symptoms of kidney injury like trouble passing urine or change in the amount of urine  trouble swallowing  unusual stomach upset or pain  vomiting Side effects that usually do not require medical attention (report to your doctor or health care professional if they continue or are bothersome):  constipation  decreased appetite  diarrhea  fatigue  headache  nausea  pain, redness, or irritation at site where injected  stomach upset  stuffy or runny nose This list may not describe all possible side effects. Call your doctor for medical advice about side effects. You may report side effects to FDA at 1-800-FDA-1088. Where should I keep  my medicine? Keep out of the reach of children. Store unopened pen in a refrigerator between 2 and 8 degrees C (36 and 46 degrees F). Do not freeze or use if the medicine has been frozen. Protect from light and excessive heat. After you first use the pen, it can be stored at room temperature between 15 and 30 degrees C (59 and 86 degrees F) or in a refrigerator. Throw away your used pen after 30 days or after the expiration date, whichever comes first. Do not store your pen with the needle attached. If the needle is left on, medicine may leak from the pen. NOTE: This sheet is a summary. It may not cover all possible information. If you have questions about this medicine, talk to your doctor, pharmacist, or health care provider.  2020 Elsevier/Gold Standard (2019-09-09 21:16:59)

## 2020-09-29 ENCOUNTER — Encounter: Payer: Self-pay | Admitting: Internal Medicine

## 2020-09-29 DIAGNOSIS — I700 Atherosclerosis of aorta: Secondary | ICD-10-CM | POA: Insufficient documentation

## 2020-09-29 DIAGNOSIS — M47816 Spondylosis without myelopathy or radiculopathy, lumbar region: Secondary | ICD-10-CM | POA: Insufficient documentation

## 2020-10-05 DIAGNOSIS — C44319 Basal cell carcinoma of skin of other parts of face: Secondary | ICD-10-CM | POA: Diagnosis not present

## 2020-10-05 DIAGNOSIS — D225 Melanocytic nevi of trunk: Secondary | ICD-10-CM | POA: Diagnosis not present

## 2020-10-05 DIAGNOSIS — D485 Neoplasm of uncertain behavior of skin: Secondary | ICD-10-CM | POA: Diagnosis not present

## 2020-10-05 DIAGNOSIS — D2272 Melanocytic nevi of left lower limb, including hip: Secondary | ICD-10-CM | POA: Diagnosis not present

## 2020-10-05 DIAGNOSIS — D2261 Melanocytic nevi of right upper limb, including shoulder: Secondary | ICD-10-CM | POA: Diagnosis not present

## 2020-10-05 DIAGNOSIS — L821 Other seborrheic keratosis: Secondary | ICD-10-CM | POA: Diagnosis not present

## 2020-10-05 DIAGNOSIS — L659 Nonscarring hair loss, unspecified: Secondary | ICD-10-CM | POA: Diagnosis not present

## 2020-10-05 DIAGNOSIS — D2271 Melanocytic nevi of right lower limb, including hip: Secondary | ICD-10-CM | POA: Diagnosis not present

## 2020-10-05 DIAGNOSIS — C44311 Basal cell carcinoma of skin of nose: Secondary | ICD-10-CM | POA: Diagnosis not present

## 2020-10-05 DIAGNOSIS — D2262 Melanocytic nevi of left upper limb, including shoulder: Secondary | ICD-10-CM | POA: Diagnosis not present

## 2020-10-16 ENCOUNTER — Other Ambulatory Visit: Payer: Self-pay

## 2020-10-16 ENCOUNTER — Other Ambulatory Visit: Payer: Medicare Other

## 2020-10-20 ENCOUNTER — Other Ambulatory Visit: Payer: Self-pay

## 2020-10-20 ENCOUNTER — Other Ambulatory Visit (INDEPENDENT_AMBULATORY_CARE_PROVIDER_SITE_OTHER): Payer: Medicare Other

## 2020-10-20 DIAGNOSIS — I10 Essential (primary) hypertension: Secondary | ICD-10-CM

## 2020-10-20 DIAGNOSIS — E119 Type 2 diabetes mellitus without complications: Secondary | ICD-10-CM | POA: Diagnosis not present

## 2020-10-20 DIAGNOSIS — Z0184 Encounter for antibody response examination: Secondary | ICD-10-CM | POA: Diagnosis not present

## 2020-10-20 DIAGNOSIS — K760 Fatty (change of) liver, not elsewhere classified: Secondary | ICD-10-CM | POA: Diagnosis not present

## 2020-10-20 DIAGNOSIS — Z1159 Encounter for screening for other viral diseases: Secondary | ICD-10-CM

## 2020-10-20 DIAGNOSIS — R7303 Prediabetes: Secondary | ICD-10-CM | POA: Diagnosis not present

## 2020-10-20 LAB — LIPID PANEL
Cholesterol: 205 mg/dL — ABNORMAL HIGH (ref 0–200)
HDL: 52.9 mg/dL (ref >39.00)
LDL Cholesterol: 122 mg/dL — ABNORMAL HIGH (ref 0–99)
NonHDL: 152.16
Total CHOL/HDL Ratio: 4
Triglycerides: 151 mg/dL — ABNORMAL HIGH (ref 0.0–149.0)
VLDL: 30.2 mg/dL (ref 0.0–40.0)

## 2020-10-20 LAB — COMPREHENSIVE METABOLIC PANEL
ALT: 16 U/L (ref 0–35)
AST: 21 U/L (ref 0–37)
Albumin: 4.2 g/dL (ref 3.5–5.2)
Alkaline Phosphatase: 56 U/L (ref 39–117)
BUN: 15 mg/dL (ref 6–23)
CO2: 30 mEq/L (ref 19–32)
Calcium: 9.1 mg/dL (ref 8.4–10.5)
Chloride: 99 mEq/L (ref 96–112)
Creatinine, Ser: 0.84 mg/dL (ref 0.40–1.20)
GFR: 71.02 mL/min (ref >60.00)
Glucose, Bld: 105 mg/dL — ABNORMAL HIGH (ref 70–99)
Potassium: 3.8 mEq/L (ref 3.5–5.1)
Sodium: 139 mEq/L (ref 135–145)
Total Bilirubin: 0.5 mg/dL (ref 0.2–1.2)
Total Protein: 7.4 g/dL (ref 6.0–8.3)

## 2020-10-20 LAB — CBC WITH DIFFERENTIAL/PLATELET
Basophils Absolute: 0 10*3/uL (ref 0.0–0.1)
Basophils Relative: 0.8 % (ref 0.0–3.0)
Eosinophils Absolute: 0.3 10*3/uL (ref 0.0–0.7)
Eosinophils Relative: 5.9 % — ABNORMAL HIGH (ref 0.0–5.0)
HCT: 41.7 % (ref 36.0–46.0)
Hemoglobin: 13.8 g/dL (ref 12.0–15.0)
Lymphocytes Relative: 17.3 % (ref 12.0–46.0)
Lymphs Abs: 0.9 10*3/uL (ref 0.7–4.0)
MCHC: 33 g/dL (ref 30.0–36.0)
MCV: 93.2 fl (ref 78.0–100.0)
Monocytes Absolute: 0.6 10*3/uL (ref 0.1–1.0)
Monocytes Relative: 12.5 % — ABNORMAL HIGH (ref 3.0–12.0)
Neutro Abs: 3.3 10*3/uL (ref 1.4–7.7)
Neutrophils Relative %: 63.5 % (ref 43.0–77.0)
Platelets: 211 10*3/uL (ref 150.0–400.0)
RBC: 4.48 Mil/uL (ref 3.87–5.11)
RDW: 13.9 % (ref 11.5–15.5)
WBC: 5.2 10*3/uL (ref 4.0–10.5)

## 2020-10-20 LAB — HEMOGLOBIN A1C: Hgb A1c MFr Bld: 6.2 % (ref 4.6–6.5)

## 2020-10-21 LAB — URINALYSIS, ROUTINE W REFLEX MICROSCOPIC
Bacteria, UA: NONE SEEN /HPF
Bilirubin Urine: NEGATIVE
Glucose, UA: NEGATIVE
Hgb urine dipstick: NEGATIVE
Hyaline Cast: NONE SEEN /LPF
Ketones, ur: NEGATIVE
Nitrite: NEGATIVE
Protein, ur: NEGATIVE
RBC / HPF: NONE SEEN /HPF (ref 0–2)
Specific Gravity, Urine: 1.008 (ref 1.001–1.03)
pH: 7 (ref 5.0–8.0)

## 2020-10-21 LAB — MICROALBUMIN / CREATININE URINE RATIO
Creatinine, Urine: 44 mg/dL (ref 20–275)
Microalb Creat Ratio: 5 mcg/mg creat (ref <30)
Microalb, Ur: 0.2 mg/dL

## 2020-10-23 LAB — HEPATITIS A ANTIBODY, TOTAL: Hepatitis A AB,Total: NONREACTIVE

## 2020-10-23 LAB — HEPATITIS B SURFACE ANTIBODY, QUANTITATIVE: Hep B S AB Quant (Post): <5 m[IU]/mL — ABNORMAL LOW (ref >=10)

## 2020-10-31 DIAGNOSIS — Z23 Encounter for immunization: Secondary | ICD-10-CM | POA: Diagnosis not present

## 2020-11-13 ENCOUNTER — Telehealth: Payer: Self-pay | Admitting: Internal Medicine

## 2020-11-13 NOTE — Telephone Encounter (Signed)
Pt will call back to schedule Medicare Annual Wellness Visit (AWV)   This should be a telephone visit only=30 minutes.  Last AWV 02/19/18; please schedule at anytime with Denisa O'Brien-Blaney at [facility].

## 2020-12-27 DIAGNOSIS — C44311 Basal cell carcinoma of skin of nose: Secondary | ICD-10-CM | POA: Diagnosis not present

## 2020-12-27 DIAGNOSIS — M950 Acquired deformity of nose: Secondary | ICD-10-CM | POA: Diagnosis not present

## 2020-12-27 DIAGNOSIS — L988 Other specified disorders of the skin and subcutaneous tissue: Secondary | ICD-10-CM | POA: Diagnosis not present

## 2020-12-27 DIAGNOSIS — L578 Other skin changes due to chronic exposure to nonionizing radiation: Secondary | ICD-10-CM | POA: Diagnosis not present

## 2020-12-27 DIAGNOSIS — L814 Other melanin hyperpigmentation: Secondary | ICD-10-CM | POA: Diagnosis not present

## 2020-12-27 DIAGNOSIS — C44319 Basal cell carcinoma of skin of other parts of face: Secondary | ICD-10-CM | POA: Diagnosis not present

## 2021-01-17 ENCOUNTER — Telehealth: Payer: Self-pay | Admitting: Internal Medicine

## 2021-01-17 NOTE — Telephone Encounter (Signed)
Left message for patient to call back and schedule Medicare Annual Wellness Visit (AWV)   This should be a virtual visit only=30 minutes.  Last AWV 02/19/18; please schedule at anytime with Denisa O'Brien-Blaney at [facility].

## 2021-02-12 DIAGNOSIS — Z1211 Encounter for screening for malignant neoplasm of colon: Secondary | ICD-10-CM | POA: Diagnosis not present

## 2021-02-12 DIAGNOSIS — Z1212 Encounter for screening for malignant neoplasm of rectum: Secondary | ICD-10-CM | POA: Diagnosis not present

## 2021-02-17 LAB — COLOGUARD: Cologuard: NEGATIVE

## 2021-02-20 ENCOUNTER — Encounter: Payer: Self-pay | Admitting: Internal Medicine

## 2021-02-27 ENCOUNTER — Ambulatory Visit: Payer: Medicare Other | Admitting: Internal Medicine

## 2021-03-13 ENCOUNTER — Encounter: Payer: Self-pay | Admitting: Internal Medicine

## 2021-03-28 ENCOUNTER — Telehealth: Payer: Self-pay | Admitting: Internal Medicine

## 2021-03-28 NOTE — Telephone Encounter (Signed)
Left message for patient to call back and schedule Medicare Annual Wellness Visit (AWV) in office.   If not able to come in office, please offer to do virtually or by telephone.   Last AWV:02/19/2018   Please schedule at anytime with Nurse Health Advisor.

## 2021-04-19 ENCOUNTER — Telehealth: Payer: Self-pay | Admitting: Internal Medicine

## 2021-04-19 NOTE — Telephone Encounter (Signed)
Left message for patient to call back and schedule Medicare Annual Wellness Visit (AWV) in office.   If not able to come in office, please offer to do virtually or by telephone.   Last AWV:02/19/2018   Please schedule at anytime with Nurse Health Advisor.

## 2021-05-09 ENCOUNTER — Other Ambulatory Visit: Payer: Self-pay | Admitting: Internal Medicine

## 2021-05-09 DIAGNOSIS — I10 Essential (primary) hypertension: Secondary | ICD-10-CM

## 2021-05-14 ENCOUNTER — Ambulatory Visit (INDEPENDENT_AMBULATORY_CARE_PROVIDER_SITE_OTHER): Payer: Medicare Other

## 2021-05-14 VITALS — Ht 59.0 in | Wt 222.0 lb

## 2021-05-14 DIAGNOSIS — Z0000 Encounter for general adult medical examination without abnormal findings: Secondary | ICD-10-CM | POA: Diagnosis not present
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
## 2021-05-14 NOTE — Patient Instructions (Addendum)
[name] , Thank you for taking time to come for your Medicare Wellness Visit. I appreciate your ongoing commitment to your health goals. Please review the following plan we discussed and let me know if I can assist you in the future.   These are the goals we discussed:  Goals      Increase physical activity     Walk for exercise      Low carb diet     Vegetables, fruits, lean meats.  Educational material provided.          This is a list of the screening recommended for you and due dates:  Health Maintenance  Topic Date Due   Hemoglobin A1C  04/20/2021   COVID-19 Vaccine (3 - Moderna risk series) 05/30/2021*   Zoster (Shingles) Vaccine (1 of 2) 08/14/2021*   Tetanus Vaccine  05/14/2022*   Pneumonia vaccines (1 of 2 - PCV13) 05/14/2022*   Flu Shot  06/18/2021   Eye exam for diabetics  07/17/2021   Complete foot exam   09/27/2021   Urine Protein Check  10/20/2021   Mammogram  06/29/2022   Cologuard (Stool DNA test)  02/18/2024   DEXA scan (bone density measurement)  Completed   Hepatitis C Screening: USPSTF Recommendation to screen - Ages 82-79 yo.  Completed   HPV Vaccine  Aged Out  *Topic was postponed. The date shown is not the original due date.   Advanced directives: End of life planning; Advance aging; Advanced directives discussed.  Copy of current HCPOA/Living Will requested.    Follow up in one year for your annual wellness visit    Preventive Care 65 Years and Older, Female Preventive care refers to lifestyle choices and visits with your health care provider that can promote health and wellness. What does preventive care include? A yearly physical exam. This is also called an annual well check. Dental exams once or twice a year. Routine eye exams. Ask your health care provider how often you should have your eyes checked. Personal lifestyle choices, including: Daily care of your teeth and gums. Regular physical activity. Eating a healthy diet. Avoiding  tobacco and drug use. Limiting alcohol use. Practicing safe sex. Taking low-dose aspirin every day. Taking vitamin and mineral supplements as recommended by your health care provider. What happens during an annual well check? The services and screenings done by your health care provider during your annual well check will depend on your age, overall health, lifestyle risk factors, and family history of disease. Counseling  Your health care provider may ask you questions about your: Alcohol use. Tobacco use. Drug use. Emotional well-being. Home and relationship well-being. Sexual activity. Eating habits. History of falls. Memory and ability to understand (cognition). Work and work Statistician. Reproductive health. Screening  You may have the following tests or measurements: Height, weight, and BMI. Blood pressure. Lipid and cholesterol levels. These may be checked every 5 years, or more frequently if you are over 76 years old. Skin check. Lung cancer screening. You may have this screening every year starting at age 57 if you have a 30-pack-year history of smoking and currently smoke or have quit within the past 15 years. Fecal occult blood test (FOBT) of the stool. You may have this test every year starting at age 75. Flexible sigmoidoscopy or colonoscopy. You may have a sigmoidoscopy every 5 years or a colonoscopy every 10 years starting at age 52. Hepatitis C blood test. Hepatitis B blood test. Sexually transmitted disease (STD) testing. Diabetes  screening. This is done by checking your blood sugar (glucose) after you have not eaten for a while (fasting). You may have this done every 1-3 years. Bone density scan. This is done to screen for osteoporosis. You may have this done starting at age 69. Mammogram. This may be done every 1-2 years. Talk to your health care provider about how often you should have regular mammograms. Talk with your health care provider about your test  results, treatment options, and if necessary, the need for more tests. Vaccines  Your health care provider may recommend certain vaccines, such as: Influenza vaccine. This is recommended every year. Tetanus, diphtheria, and acellular pertussis (Tdap, Td) vaccine. You may need a Td booster every 10 years. Zoster vaccine. You may need this after age 69. Pneumococcal 13-valent conjugate (PCV13) vaccine. One dose is recommended after age 48. Pneumococcal polysaccharide (PPSV23) vaccine. One dose is recommended after age 50. Talk to your health care provider about which screenings and vaccines you need and how often you need them. This information is not intended to replace advice given to you by your health care provider. Make sure you discuss any questions you have with your health care provider. Document Released: 12/01/2015 Document Revised: 07/24/2016 Document Reviewed: 09/05/2015 Elsevier Interactive Patient Education  2017 [place] Prevention in the Home Falls can cause injuries. They can happen to people of all ages. There are many things you can do to make your home safe and to help prevent falls. What can I do on the outside of my home? Regularly fix the edges of walkways and driveways and fix any cracks. Remove anything that might make you trip as you walk through a door, such as a raised step or threshold. Trim any bushes or trees on the path to your home. Use bright outdoor lighting. Clear any walking paths of anything that might make someone trip, such as rocks or tools. Regularly check to see if handrails are loose or broken. Make sure that both sides of any steps have handrails. Any raised decks and porches should have guardrails on the edges. Have any leaves, snow, or ice cleared regularly. Use sand or salt on walking paths during winter. Clean up any spills in your garage right away. This includes oil or grease spills. What can I do in the bathroom? Use night  lights. Install grab bars by the toilet and in the tub and shower. Do not use towel bars as grab bars. Use non-skid mats or decals in the tub or shower. If you need to sit down in the shower, use a plastic, non-slip stool. Keep the floor dry. Clean up any water that spills on the floor as soon as it happens. Remove soap buildup in the tub or shower regularly. Attach bath mats securely with double-sided non-slip rug tape. Do not have throw rugs and other things on the floor that can make you trip. What can I do in the bedroom? Use night lights. Make sure that you have a light by your bed that is easy to reach. Do not use any sheets or blankets that are too big for your bed. They should not hang down onto the floor. Have a firm chair that has side arms. You can use this for support while you get dressed. Do not have throw rugs and other things on the floor that can make you trip. What can I do in the kitchen? Clean up any spills right away. Avoid walking on wet floors. Keep  items that you use a lot in easy-to-reach places. If you need to reach something above you, use a strong step stool that has a grab bar. Keep electrical cords out of the way. Do not use floor polish or wax that makes floors slippery. If you must use wax, use non-skid floor wax. Do not have throw rugs and other things on the floor that can make you trip. What can I do with my stairs? Do not leave any items on the stairs. Make sure that there are handrails on both sides of the stairs and use them. Fix handrails that are broken or loose. Make sure that handrails are as long as the stairways. Check any carpeting to make sure that it is firmly attached to the stairs. Fix any carpet that is loose or worn. Avoid having throw rugs at the top or bottom of the stairs. If you do have throw rugs, attach them to the floor with carpet tape. Make sure that you have a light switch at the top of the stairs and the bottom of the stairs. If  you do not have them, ask someone to add them for you. What else can I do to help prevent falls? Wear shoes that: Do not have high heels. Have rubber bottoms. Are comfortable and fit you well. Are closed at the toe. Do not wear sandals. If you use a stepladder: Make sure that it is fully opened. Do not climb a closed stepladder. Make sure that both sides of the stepladder are locked into place. Ask someone to hold it for you, if possible. Clearly mark and make sure that you can see: Any grab bars or handrails. First and last steps. Where the edge of each step is. Use tools that help you move around (mobility aids) if they are needed. These include: Canes. Walkers. Scooters. Crutches. Turn on the lights when you go into a dark area. Replace any light bulbs as soon as they burn out. Set up your furniture so you have a clear path. Avoid moving your furniture around. If any of your floors are uneven, fix them. If there are any pets around you, be aware of where they are. Review your medicines with your doctor. Some medicines can make you feel dizzy. This can increase your chance of falling. Ask your doctor what other things that you can do to help prevent falls. This information is not intended to replace advice given to you by your health care provider. Make sure you discuss any questions you have with your health care provider. Document Released: 08/31/2009 Document Revised: 04/11/2016 Document Reviewed: 12/09/2014 Elsevier Interactive Patient Education  2017 [employer].

## 2021-05-23 DIAGNOSIS — Z20822 Contact with and (suspected) exposure to covid-19: Secondary | ICD-10-CM | POA: Diagnosis not present

## 2021-05-29 ENCOUNTER — Other Ambulatory Visit: Payer: Self-pay | Admitting: Internal Medicine

## 2021-05-29 DIAGNOSIS — I10 Essential (primary) hypertension: Secondary | ICD-10-CM

## 2021-06-26 ENCOUNTER — Other Ambulatory Visit: Payer: Self-pay | Admitting: Internal Medicine

## 2021-06-29 ENCOUNTER — Encounter: Payer: Self-pay | Admitting: *Deleted

## 2021-07-16 ENCOUNTER — Telehealth: Payer: Self-pay | Admitting: Internal Medicine

## 2021-07-16 ENCOUNTER — Other Ambulatory Visit: Payer: Self-pay | Admitting: Family

## 2021-07-16 DIAGNOSIS — M25561 Pain in right knee: Secondary | ICD-10-CM

## 2021-07-16 NOTE — Telephone Encounter (Signed)
Patient and husband calling in for referral for [facility] ortho provider. Having bilateral knee pain but mainly in the right.   Please advise

## 2021-07-19 ENCOUNTER — Ambulatory Visit (INDEPENDENT_AMBULATORY_CARE_PROVIDER_SITE_OTHER): Payer: Medicare Other | Admitting: Dermatology

## 2021-07-19 ENCOUNTER — Other Ambulatory Visit: Payer: Self-pay

## 2021-07-19 DIAGNOSIS — L570 Actinic keratosis: Secondary | ICD-10-CM | POA: Diagnosis not present

## 2021-07-19 DIAGNOSIS — L821 Other seborrheic keratosis: Secondary | ICD-10-CM

## 2021-07-19 DIAGNOSIS — Z1283 Encounter for screening for malignant neoplasm of skin: Secondary | ICD-10-CM

## 2021-07-19 DIAGNOSIS — D229 Melanocytic nevi, unspecified: Secondary | ICD-10-CM | POA: Diagnosis not present

## 2021-07-19 DIAGNOSIS — D225 Melanocytic nevi of trunk: Secondary | ICD-10-CM

## 2021-07-19 DIAGNOSIS — L578 Other skin changes due to chronic exposure to nonionizing radiation: Secondary | ICD-10-CM

## 2021-07-19 DIAGNOSIS — Z85828 Personal history of other malignant neoplasm of skin: Secondary | ICD-10-CM | POA: Diagnosis not present

## 2021-07-19 DIAGNOSIS — D1801 Hemangioma of skin and subcutaneous tissue: Secondary | ICD-10-CM | POA: Diagnosis not present

## 2021-07-19 DIAGNOSIS — L820 Inflamed seborrheic keratosis: Secondary | ICD-10-CM

## 2021-07-19 DIAGNOSIS — L814 Other melanin hyperpigmentation: Secondary | ICD-10-CM

## 2021-07-19 DIAGNOSIS — D1800 Hemangioma unspecified site: Secondary | ICD-10-CM

## 2021-07-19 DIAGNOSIS — L659 Nonscarring hair loss, unspecified: Secondary | ICD-10-CM

## 2021-07-19 DIAGNOSIS — D485 Neoplasm of uncertain behavior of skin: Secondary | ICD-10-CM | POA: Diagnosis not present

## 2021-07-19 DIAGNOSIS — D239 Other benign neoplasm of skin, unspecified: Secondary | ICD-10-CM

## 2021-07-19 HISTORY — DX: Other benign neoplasm of skin, unspecified: D23.9

## 2021-07-19 NOTE — Progress Notes (Signed)
New Patient Visit  Subjective  Jennifer [name] is a 70 y.o. female who presents for the following: Annual Exam (Hx of BCC of the upper lip/nose and L forehead treated with MOHS by Dr. Manley Mason and diagnosed by Dr. Kellie Moor. Patient has noticed itchy irritated skin lesions on the back that she would like treated today. ). Patient c/o significant hair loss since having COVID two years ago.   The following portions of the chart were reviewed this encounter and updated as appropriate:   Tobacco  Allergies  Meds  Problems  Med Hx  Surg Hx  Fam Hx     Review of Systems:  No other skin or systemic complaints except as noted in HPI or Assessment and Plan.  Objective  Well appearing patient in no apparent distress; mood and affect are within normal limits.  A full examination was performed including scalp, head, eyes, ears, nose, lips, neck, chest, axillae, abdomen, back, buttocks, bilateral upper extremities, bilateral lower extremities, hands, feet, fingers, toes, fingernails, and toenails. All findings within normal limits unless otherwise noted below.  Scalp Diffuse thinning of hair.  R frontal scalp x 1 Erythematous thin papules/macules with gritty scale.   Low back R of midline 0.4 cm medium to dark brown thin papule.     L upper back 0.5 cm irregular medium to dark brown papule.   L mid back 0.8 cm keratotic pink papule      R mid back Red papule 0.6 cm   Back x Erythematous keratotic or waxy stuck-on papule or plaque.    Assessment & Plan  Alopecia Scalp  Plan to discuss and treat at follow up visit - recommend Rogaine for all types of hair loss. No personal hx of heart disease but patient does have a fhx of heart disease.  AK (actinic keratosis) R frontal scalp x 1  Prior to procedure, discussed risks of blister formation, small wound, skin dyspigmentation, or rare scar following cryotherapy. Recommend Vaseline ointment to treated areas while  healing.   Destruction of lesion - R frontal scalp x 1 Complexity: simple   Destruction method: cryotherapy   Informed consent: discussed and consent obtained   Timeout:  patient name, date of birth, surgical site, and procedure verified Lesion destroyed using liquid nitrogen: Yes   Region frozen until ice ball extended beyond lesion: Yes   Outcome: patient tolerated procedure well with no complications   Post-procedure details: wound care instructions given    Neoplasm of uncertain behavior of skin (4) Low back R of midline  Epidermal / dermal shaving  Lesion diameter (cm):  0.4 Informed consent: discussed and consent obtained   Timeout: patient name, date of birth, surgical site, and procedure verified   Procedure prep:  Patient was prepped and draped in usual sterile fashion Prep type:  Isopropyl alcohol Anesthesia: the lesion was anesthetized in a standard fashion   Anesthetic:  1% lidocaine w/ epinephrine 1-100,000 buffered w/ 8.4% NaHCO3 Instrument used: flexible razor blade   Hemostasis achieved with: pressure, aluminum chloride and electrodesiccation   Outcome: patient tolerated procedure well   Post-procedure details: sterile dressing applied and wound care instructions given   Dressing type: bandage and petrolatum    Specimen 1 - Surgical pathology Differential Diagnosis: D48.5 r/o dysplastic nevus  Check Margins: No  L upper back  Epidermal / dermal shaving  Lesion diameter (cm):  0.5 Informed consent: discussed and consent obtained   Timeout: patient name, date of birth, surgical site, and procedure  verified   Procedure prep:  Patient was prepped and draped in usual sterile fashion Prep type:  Isopropyl alcohol Anesthesia: the lesion was anesthetized in a standard fashion   Anesthetic:  1% lidocaine w/ epinephrine 1-100,000 buffered w/ 8.4% NaHCO3 Instrument used: flexible razor blade   Hemostasis achieved with: pressure, aluminum chloride and  electrodesiccation   Outcome: patient tolerated procedure well   Post-procedure details: sterile dressing applied and wound care instructions given   Dressing type: bandage and petrolatum    Specimen 2 - Surgical pathology Differential Diagnosis: D48.5 r/o dysplastic nevus Check Margins: No  L mid back  Skin / nail biopsy Type of biopsy: tangential   Informed consent: discussed and consent obtained   Timeout: patient name, date of birth, surgical site, and procedure verified   Procedure prep:  Patient was prepped and draped in usual sterile fashion Prep type:  Isopropyl alcohol Anesthesia: the lesion was anesthetized in a standard fashion   Anesthetic:  1% lidocaine w/ epinephrine 1-100,000 buffered w/ 8.4% NaHCO3 Instrument used: flexible razor blade   Hemostasis achieved with: pressure, aluminum chloride and electrodesiccation   Outcome: patient tolerated procedure well   Post-procedure details: sterile dressing applied and wound care instructions given   Dressing type: bandage and petrolatum    Specimen 3 - Surgical pathology Differential Diagnosis: D48.5 r/o SCC  Check Margins: No  R mid back  Epidermal / dermal shaving  Lesion diameter (cm):  0.6 Informed consent: discussed and consent obtained   Timeout: patient name, date of birth, surgical site, and procedure verified   Procedure prep:  Patient was prepped and draped in usual sterile fashion Prep type:  Isopropyl alcohol Anesthesia: the lesion was anesthetized in a standard fashion   Anesthetic:  1% lidocaine w/ epinephrine 1-100,000 buffered w/ 8.4% NaHCO3 Instrument used: flexible razor blade   Hemostasis achieved with: pressure, aluminum chloride and electrodesiccation   Outcome: patient tolerated procedure well   Post-procedure details: sterile dressing applied and wound care instructions given   Dressing type: bandage and petrolatum    Specimen 4 - Surgical pathology Differential Diagnosis: D48.5 r/o  irritated hemangioma Check Margins: No  Inflamed seborrheic keratosis Back x  Symptomatic  Prior to procedure, discussed risks of blister formation, small wound, skin dyspigmentation, or rare scar following cryotherapy. Recommend Vaseline ointment to treated areas while healing.   Destruction of lesion - Back x Complexity: simple   Destruction method: cryotherapy   Informed consent: discussed and consent obtained   Timeout:  patient name, date of birth, surgical site, and procedure verified Lesion destroyed using liquid nitrogen: Yes   Region frozen until ice ball extended beyond lesion: Yes   Outcome: patient tolerated procedure well with no complications   Post-procedure details: wound care instructions given    Lentigines - Scattered tan macules - Due to sun exposure - Benign-appering, observe - Recommend daily broad spectrum sunscreen SPF 30+ to sun-exposed areas, reapply every 2 hours as needed. - Call for any changes  Seborrheic Keratoses - Stuck-on, waxy, tan-brown papules and/or plaques  - Benign-appearing - Discussed benign etiology and prognosis. - Observe - Call for any changes  Melanocytic Nevi - Tan-brown and/or pink-flesh-colored symmetric macules and papules - Benign appearing on exam today - Observation - Call clinic for new or changing moles - Recommend daily use of broad spectrum spf 30+ sunscreen to sun-exposed areas.   Hemangiomas - Red papules - Discussed benign nature - Observe - Call for any changes  Actinic Damage - Severe,  confluent actinic changes with pre-cancerous actinic keratoses  - Severe, chronic, not at goal, secondary to cumulative UV radiation exposure over time - diffuse scaly erythematous macules and papules with underlying dyspigmentation - Discussed Prescription "Field Treatment" for Severe, Chronic Confluent Actinic Changes with Pre-Cancerous Actinic Keratoses Field treatment involves treatment of an entire area of skin that  has confluent Actinic Changes (Sun/ Ultraviolet light damage) and PreCancerous Actinic Keratoses by method of PhotoDynamic Therapy (PDT) and/or prescription Topical Chemotherapy agents such as 5-fluorouracil, 5-fluorouracil/calcipotriene, and/or imiquimod.  The purpose is to decrease the number of clinically evident and subclinical PreCancerous lesions to prevent progression to development of skin cancer by chemically destroying early precancer changes that may or may not be visible.  It has been shown to reduce the risk of developing skin cancer in the treated area. As a result of treatment, redness, scaling, crusting, and open sores may occur during treatment course. One or more than one of these methods may be used and may have to be used several times to control, suppress and eliminate the PreCancerous changes. Discussed treatment course, expected reaction, and possible side effects. - Recommend daily broad spectrum sunscreen SPF 30+ to sun-exposed areas, reapply every 2 hours as needed.  - Staying in the shade or wearing long sleeves, sun glasses (UVA+UVB protection) and wide brim hats (4-inch brim around the entire circumference of the hat) are also recommended. - Call for new or changing lesions. - plan PDT - Recommend Niacinamide or Nicotinamide '500mg'$  twice per day to lower risk of non-melanoma skin cancer by approximately 25%. This is usually available at [employer].  History of Basal Cell Carcinoma of the Skin - L forehead, L nare - No evidence of recurrence today - Recommend regular full body skin exams - Recommend daily broad spectrum sunscreen SPF 30+ to sun-exposed areas, reapply every 2 hours as needed.  - Call if any new or changing lesions are noted between office visits  Skin cancer screening performed today.  Return in about 6 weeks (around 08/30/2021) for PDT of the face and then the full scalp seperately.   Luther Redo, CMA, am acting as scribe for Forest Gleason, MD  .  Documentation: I have reviewed the above documentation for accuracy and completeness, and I agree with the above.  Forest Gleason, MD

## 2021-07-19 NOTE — Patient Instructions (Addendum)
If you have any questions or concerns for your doctor, please call our main line at [phone] and press option 4 to reach your doctor's medical assistant. If no one answers, please leave a voicemail as directed and we will return your call as soon as possible. Messages left after 4 pm will be answered the following business day.   You may also send [place] a message via [place]. We typically respond to MyChart messages within 1-2 business days.  For prescription refills, please ask your pharmacy to contact our office. Our fax number is [phone].  If you have an urgent issue when the clinic is closed that cannot wait until the next business day, you can page your doctor at the number below.    Please note that while we do our best to be available for urgent issues outside of office hours, we are not available 24/7.   If you have an urgent issue and are unable to reach [place], you may choose to seek medical care at your doctor's office, retail clinic, urgent care center, or emergency room.  If you have a medical emergency, please immediately call 911 or go to the emergency department.  Pager Numbers  - Dr. Nehemiah Massed: [phone]  - Dr. Laurence Ferrari: [phone]  - Dr. Nicole Kindred: [phone]  In the event of inclement weather, please call our main line at [phone] for an update on the status of any delays or closures.  Dermatology Medication Tips: Please keep the boxes that topical medications come in in order to help keep track of the instructions about where and how to use these. Pharmacies typically print the medication instructions only on the boxes and not directly on the medication tubes.   If your medication is too expensive, please contact our office at [phone] option 4 or send [place] a message through [place].   We are unable to tell what your co-pay for medications will be in advance as this is different depending on your insurance coverage. However, we may be able to find a substitute  medication at lower cost or fill out paperwork to get insurance to cover a needed medication.   If a prior authorization is required to get your medication covered by your insurance company, please allow [place] 1-2 business days to complete this process.  Drug prices often vary depending on where the prescription is filled and some pharmacies may offer cheaper prices.  The website www.goodrx.com contains coupons for medications through different pharmacies. The prices here do not account for what the cost may be with help from insurance (it may be cheaper with your insurance), but the website can give you the price if you did not use any insurance.  - You can print the associated coupon and take it with your prescription to the pharmacy.  - You may also stop by our office during regular business hours and pick up a GoodRx coupon card.  - If you need your prescription sent electronically to a different pharmacy, notify our office through [facility] or by phone at [phone] option 4.  Recommend Niacinamide or Nicotinamide '500mg'$  twice per day to lower risk of non-melanoma skin cancer by approximately 25%. This is usually available at [facility].  Wound Care Instructions  Cleanse wound gently with soap and water once a day then pat dry with clean gauze. Apply a thing coat of Petrolatum (petroleum jelly, "Vaseline") over the wound (unless you have an allergy to this). We recommend that you use a new, sterile tube of  Vaseline. Do not pick or remove scabs. Do not remove the yellow or white "healing tissue" from the base of the wound.  Cover the wound with fresh, clean, nonstick gauze and secure with paper tape. You may use Band-Aids in place of gauze and tape if the would is small enough, but would recommend trimming much of the tape off as there is often too much. Sometimes Band-Aids can irritate the skin.  You should call the office for your biopsy report after 1 week if you have not  already been contacted.  If you experience any problems, such as abnormal amounts of bleeding, swelling, significant bruising, significant pain, or evidence of infection, please call the office immediately.  FOR ADULT SURGERY PATIENTS: If you need something for pain relief you may take 1 extra strength Tylenol (acetaminophen) AND 2 Ibuprofen ('200mg'$  each) together every 4 hours as needed for pain. (do not take these if you are allergic to them or if you have a reason you should not take them.) Typically, you may only need pain medication for 1 to 3 days.

## 2021-08-01 ENCOUNTER — Telehealth: Payer: Self-pay

## 2021-08-01 NOTE — Telephone Encounter (Signed)
-----   Message from Alfonso Patten, MD sent at 07/31/2021  5:25 PM EDT ----- 1. Skin , low back right of midline DYSPLASTIC NEVUS WITH MODERATE TO SEVERE ATYPIA, CLOSE TO MARGIN --> Excision  This is a MODERATELY TO SEVERELY ATYPICAL MOLE. On the spectrum from normal mole to melanoma skin cancer, this is in between the two but closer towards a melanoma skin cancer.  - The treatment of choice for severely atypical moles is to cut them out in clinic with an area of normal looking skin around them to get all the atypical cells out. The skin that is removed will be sent to check under the microscope again to be sure it looks completely out.   - People who have a history of atypical moles do have a slightly increased risk of developing melanoma somewhere on the body, so a full body skin exam by a dermatologist is recommended at least once a year. - Monthly self skin checks and daily sun protection are also recommended.  - Please also call if you notice any new or changing spots anywhere else on the body before your follow-up visit.   2. Skin , left upper back DYSPLASTIC COMPOUND NEVUS WITH MODERATE ATYPIA, CLOSE TO MARGIN  This is a MODERATELY ATYPICAL MOLE. On the spectrum from normal mole to melanoma skin cancer, this is in between the two. - We need to recheck this area sometime in the next 6 months to be sure there is no evidence of the atypical mole coming back. If there is any color coming back, we would recommend repeating the biopsy to be sure the cells look normal.  - People who have a history of atypical moles do have a slightly increased risk of developing melanoma somewhere on the body, so a yearly full body skin exam by a dermatologist is recommended.  - Monthly self skin checks and daily sun protection are also recommended.  - Please call if you notice a dark spot coming back where this biopsy was taken.  - Please also call if you notice any new or changing spots anywhere else on the body  before your follow-up visit.    3. Skin , left mid back SEBORRHEIC KERATOSIS, IRRITATED, BASE INVOLVED  This is a benign growth or "wisdom spot". No additional treatment is needed.   4. Skin , right mid back HEMANGIOMA, BASE INVOLVED  "Benign blood vessel growth" --> no treatment needed  CC PCP  Dr. Laurence Ferrari called 07/31/2021 and no answer, left voicemail  MAs please call. Thank you!

## 2021-08-02 ENCOUNTER — Encounter: Payer: Self-pay | Admitting: Dermatology

## 2021-08-02 ENCOUNTER — Ambulatory Visit (INDEPENDENT_AMBULATORY_CARE_PROVIDER_SITE_OTHER): Payer: Medicare Other

## 2021-08-02 ENCOUNTER — Encounter: Payer: Self-pay | Admitting: Orthopaedic Surgery

## 2021-08-02 ENCOUNTER — Other Ambulatory Visit: Payer: Self-pay

## 2021-08-02 ENCOUNTER — Telehealth: Payer: Self-pay

## 2021-08-02 ENCOUNTER — Ambulatory Visit: Payer: Self-pay

## 2021-08-02 ENCOUNTER — Ambulatory Visit (INDEPENDENT_AMBULATORY_CARE_PROVIDER_SITE_OTHER): Payer: Medicare Other | Admitting: Orthopaedic Surgery

## 2021-08-02 DIAGNOSIS — M1712 Unilateral primary osteoarthritis, left knee: Secondary | ICD-10-CM

## 2021-08-02 DIAGNOSIS — M25561 Pain in right knee: Secondary | ICD-10-CM

## 2021-08-02 DIAGNOSIS — M25562 Pain in left knee: Secondary | ICD-10-CM

## 2021-08-02 DIAGNOSIS — M1711 Unilateral primary osteoarthritis, right knee: Secondary | ICD-10-CM | POA: Diagnosis not present

## 2021-08-02 DIAGNOSIS — G8929 Other chronic pain: Secondary | ICD-10-CM | POA: Diagnosis not present

## 2021-08-02 MED ORDER — LIDOCAINE HCL 1 % IJ SOLN
3.0000 mL | INTRAMUSCULAR | Status: AC | PRN
  Administered 2021-08-02: 3 mL

## 2021-08-02 MED ORDER — METHYLPREDNISOLONE ACETATE 40 MG/ML IJ SUSP
40.0000 mg | INTRAMUSCULAR | Status: AC | PRN
  Administered 2021-08-02: 40 mg via INTRA_ARTICULAR
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
## 2021-08-02 NOTE — Telephone Encounter (Signed)
Please get auth for bilateral knee gel injection-dr. Ninfa Linden pt

## 2021-08-03 ENCOUNTER — Telehealth: Payer: Self-pay

## 2021-08-03 NOTE — Telephone Encounter (Signed)
Called and advised pt. She stated understanding  

## 2021-08-03 NOTE — Telephone Encounter (Signed)
Patient called stating that she received knee injections yesterday and that her knees were hurting more last night while she was laying in the bed.  Stated that this morning she is not able to walk on her right knee.  Would like to know if this is normal?  CB# [phone].  Please advise.  Thank you.

## 2021-08-03 NOTE — Telephone Encounter (Signed)
Noted  

## 2021-08-03 NOTE — Telephone Encounter (Signed)
Please advise 

## 2021-08-20 DIAGNOSIS — Z1231 Encounter for screening mammogram for malignant neoplasm of breast: Secondary | ICD-10-CM | POA: Diagnosis not present

## 2021-08-20 LAB — HM MAMMOGRAPHY

## 2021-08-22 ENCOUNTER — Encounter: Payer: Self-pay | Admitting: Internal Medicine

## 2021-08-24 ENCOUNTER — Telehealth: Payer: Self-pay

## 2021-08-24 NOTE — Telephone Encounter (Signed)
VOB has been submitted for Monovisc, bilateral knee. Pending BV.

## 2021-08-27 ENCOUNTER — Telehealth: Payer: Self-pay

## 2021-08-27 NOTE — Telephone Encounter (Signed)
Talked with patient concerning appointment for gel injection.  Patient will CB to schedule when she is ready to proceed.  Approved for Monovisc, bilateral knee. [place] deductible has been met Patient will be responsible for 20% OOP. No Co-pay No PA required

## 2021-08-28 ENCOUNTER — Encounter: Payer: Medicare Other | Admitting: Dermatology

## 2021-08-30 ENCOUNTER — Ambulatory Visit: Payer: Medicare Other

## 2021-09-24 ENCOUNTER — Other Ambulatory Visit: Payer: Self-pay | Admitting: Internal Medicine

## 2021-09-26 ENCOUNTER — Encounter: Payer: Medicare Other | Admitting: Dermatology

## 2021-10-02 DIAGNOSIS — Z23 Encounter for immunization: Secondary | ICD-10-CM | POA: Diagnosis not present

## 2021-10-04 DIAGNOSIS — Z20828 Contact with and (suspected) exposure to other viral communicable diseases: Secondary | ICD-10-CM | POA: Diagnosis not present

## 2021-12-17 ENCOUNTER — Ambulatory Visit (INDEPENDENT_AMBULATORY_CARE_PROVIDER_SITE_OTHER): Payer: Medicare Other | Admitting: Orthopaedic Surgery

## 2021-12-17 ENCOUNTER — Encounter: Payer: Self-pay | Admitting: Orthopaedic Surgery

## 2021-12-17 DIAGNOSIS — M1712 Unilateral primary osteoarthritis, left knee: Secondary | ICD-10-CM

## 2021-12-17 DIAGNOSIS — M1711 Unilateral primary osteoarthritis, right knee: Secondary | ICD-10-CM

## 2021-12-17 MED ORDER — HYALURONAN 88 MG/4ML IX SOSY
88.0000 mg | PREFILLED_SYRINGE | INTRA_ARTICULAR | Status: AC | PRN
  Administered 2021-12-17: 88 mg via INTRA_ARTICULAR

## 2021-12-17 NOTE — Progress Notes (Signed)
Procedure Note  [name]             Date of Birth: 10-06-1951           MRN: [id number]             Visit Date: 12/17/2021  Procedures: Visit Diagnoses:  1. Unilateral primary osteoarthritis, left knee   2. Unilateral primary osteoarthritis, right knee     Large Joint Inj: R knee on 12/17/2021 10:50 AM Indications: diagnostic evaluation and pain Details: 22 G 1.5 in needle, superolateral approach  Arthrogram: No  Medications: 88 mg Hyaluronan 88 MG/4ML Outcome: tolerated well, no immediate complications Procedure, treatment alternatives, risks and benefits explained, specific risks discussed. Consent was given by the patient. Immediately prior to procedure a time out was called to verify the correct patient, procedure, equipment, support staff and site/side marked as required. Patient was prepped and draped in the usual sterile fashion.    Large Joint Inj: L knee on 12/17/2021 10:50 AM Indications: diagnostic evaluation and pain Details: 22 G 1.5 in needle, superolateral approach  Arthrogram: No  Medications: 88 mg Hyaluronan 88 MG/4ML Outcome: tolerated well, no immediate complications Procedure, treatment alternatives, risks and benefits explained, specific risks discussed. Consent was given by the patient. Immediately prior to procedure a time out was called to verify the correct patient, procedure, equipment, support staff and site/side marked as required. Patient was prepped and draped in the usual sterile fashion.    The patient is here today for scheduled hyaluronic acid injections in both knees with Monovisc to treat the pain from known osteoarthritis.  She has tried and failed other conservative treatment measures including steroid injections in both knees.  She understands why we are recommending these type of injections today.  Neither knee today has any effusion.  Both knees have patellofemoral crepitation and varus malalignment and do hurt quite a  bit.  I did place Monovisc in both knees without difficulty.  All questions and concerns were answered and addressed.  Follow-up is as needed.

## 2022-01-10 DIAGNOSIS — Z20822 Contact with and (suspected) exposure to covid-19: Secondary | ICD-10-CM | POA: Diagnosis not present

## 2022-01-14 DIAGNOSIS — Z20822 Contact with and (suspected) exposure to covid-19: Secondary | ICD-10-CM | POA: Diagnosis not present

## 2022-01-18 DIAGNOSIS — Z20822 Contact with and (suspected) exposure to covid-19: Secondary | ICD-10-CM | POA: Diagnosis not present

## 2022-02-17 DIAGNOSIS — Z20822 Contact with and (suspected) exposure to covid-19: Secondary | ICD-10-CM | POA: Diagnosis not present

## 2022-02-22 DIAGNOSIS — Z20822 Contact with and (suspected) exposure to covid-19: Secondary | ICD-10-CM | POA: Diagnosis not present

## 2022-02-26 DIAGNOSIS — R059 Cough, unspecified: Secondary | ICD-10-CM | POA: Diagnosis not present

## 2022-02-26 DIAGNOSIS — R051 Acute cough: Secondary | ICD-10-CM | POA: Diagnosis not present

## 2022-02-26 DIAGNOSIS — Z20822 Contact with and (suspected) exposure to covid-19: Secondary | ICD-10-CM | POA: Diagnosis not present

## 2022-03-04 DIAGNOSIS — Z20822 Contact with and (suspected) exposure to covid-19: Secondary | ICD-10-CM | POA: Diagnosis not present

## 2022-03-12 ENCOUNTER — Ambulatory Visit (INDEPENDENT_AMBULATORY_CARE_PROVIDER_SITE_OTHER): Payer: Medicare Other | Admitting: Internal Medicine

## 2022-03-12 ENCOUNTER — Ambulatory Visit (INDEPENDENT_AMBULATORY_CARE_PROVIDER_SITE_OTHER): Payer: Medicare Other

## 2022-03-12 ENCOUNTER — Encounter: Payer: Self-pay | Admitting: Internal Medicine

## 2022-03-12 VITALS — BP 126/82 | HR 60 | Temp 98.0°F | Resp 14 | Ht 59.0 in | Wt 210.0 lb

## 2022-03-12 DIAGNOSIS — Z1389 Encounter for screening for other disorder: Secondary | ICD-10-CM | POA: Diagnosis not present

## 2022-03-12 DIAGNOSIS — Z1231 Encounter for screening mammogram for malignant neoplasm of breast: Secondary | ICD-10-CM

## 2022-03-12 DIAGNOSIS — R1310 Dysphagia, unspecified: Secondary | ICD-10-CM

## 2022-03-12 DIAGNOSIS — Z1329 Encounter for screening for other suspected endocrine disorder: Secondary | ICD-10-CM

## 2022-03-12 DIAGNOSIS — E119 Type 2 diabetes mellitus without complications: Secondary | ICD-10-CM | POA: Diagnosis not present

## 2022-03-12 DIAGNOSIS — I10 Essential (primary) hypertension: Secondary | ICD-10-CM

## 2022-03-12 DIAGNOSIS — F439 Reaction to severe stress, unspecified: Secondary | ICD-10-CM

## 2022-03-12 DIAGNOSIS — M47812 Spondylosis without myelopathy or radiculopathy, cervical region: Secondary | ICD-10-CM | POA: Insufficient documentation

## 2022-03-12 DIAGNOSIS — G2581 Restless legs syndrome: Secondary | ICD-10-CM | POA: Diagnosis not present

## 2022-03-12 DIAGNOSIS — R011 Cardiac murmur, unspecified: Secondary | ICD-10-CM

## 2022-03-12 DIAGNOSIS — E559 Vitamin D deficiency, unspecified: Secondary | ICD-10-CM

## 2022-03-12 DIAGNOSIS — R29898 Other symptoms and signs involving the musculoskeletal system: Secondary | ICD-10-CM

## 2022-03-12 DIAGNOSIS — R002 Palpitations: Secondary | ICD-10-CM | POA: Diagnosis not present

## 2022-03-12 DIAGNOSIS — G4700 Insomnia, unspecified: Secondary | ICD-10-CM

## 2022-03-12 DIAGNOSIS — Z1211 Encounter for screening for malignant neoplasm of colon: Secondary | ICD-10-CM

## 2022-03-12 DIAGNOSIS — M2578 Osteophyte, vertebrae: Secondary | ICD-10-CM | POA: Diagnosis not present

## 2022-03-12 DIAGNOSIS — F4323 Adjustment disorder with mixed anxiety and depressed mood: Secondary | ICD-10-CM

## 2022-03-12 DIAGNOSIS — E042 Nontoxic multinodular goiter: Secondary | ICD-10-CM

## 2022-03-12 MED ORDER — AMLODIPINE BESYLATE 5 MG PO TABS: 5.0000 mg | ORAL_TABLET | Freq: Every day | ORAL | 3 refills | Status: DC

## 2022-03-12 MED ORDER — TRIAMTERENE-HCTZ 37.5-25 MG PO TABS: 1.0000 | ORAL_TABLET | Freq: Every day | ORAL | 3 refills | Status: DC

## 2022-03-12 MED ORDER — ROPINIROLE HCL 0.25 MG PO TABS: 0.2500 mg | ORAL_TABLET | Freq: Every day | ORAL | 0 refills | Status: DC

## 2022-03-12 MED ORDER — ATENOLOL 50 MG PO TABS: 50.0000 mg | ORAL_TABLET | Freq: Every day | ORAL | 3 refills | Status: DC
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
## 2022-03-12 NOTE — Patient Instructions (Addendum)
Consider prevnar 20 vaccine we have in the clinic here  ?-we have trazadone for sleep  ?You can try for sleep with or w/o L theaninine 100-200 mg at night  ?Ashwaghana stress supplement  ?Insomnia ?Insomnia is a sleep disorder that makes it difficult to fall asleep or stay asleep. Insomnia can cause fatigue, low energy, difficulty concentrating, mood swings, and poor performance at work or school. ?There are three different ways to classify insomnia: ?Difficulty falling asleep. ?Difficulty staying asleep. ?Waking up too early in the morning. ?Any type of insomnia can be long-term (chronic) or short-term (acute). Both are common. Short-term insomnia usually lasts for 3 months or less. Chronic insomnia occurs at least three times a week for longer than 3 months. ?What are the causes? ?Insomnia may be caused by another condition, situation, or substance, such as: ?Having certain mental health conditions, such as anxiety and depression. ?Using caffeine, alcohol, tobacco, or drugs. ?Having gastrointestinal conditions, such as gastroesophageal reflux disease (GERD). ?Having certain medical conditions. These include: ?Asthma. ?Alzheimer's disease. ?Stroke. ?Chronic pain. ?An overactive thyroid gland (hyperthyroidism). ?Other sleep disorders, such as restless legs syndrome and sleep apnea. ?Menopause. ?Sometimes, the cause of insomnia may not be known. ?What increases the risk? ?Risk factors for insomnia include: ?Gender. Females are affected more often than males. ?Age. Insomnia is more common as people get older. ?Stress and certain medical and mental health conditions. ?Lack of exercise. ?Having an irregular work schedule. This may include working night shifts and traveling between different time zones. ?What are the signs or symptoms? ?If you have insomnia, the main symptom is having trouble falling asleep or having trouble staying asleep. This may lead to other symptoms, such as: ?Feeling tired or having low  energy. ?Feeling nervous about going to sleep. ?Not feeling rested in the morning. ?Having trouble concentrating. ?Feeling irritable, anxious, or depressed. ?How is this diagnosed? ?This condition may be diagnosed based on: ?Your symptoms and medical history. Your health care provider may ask about: ?Your sleep habits. ?Any medical conditions you have. ?Your mental health. ?A physical exam. ?How is this treated? ?Treatment for insomnia depends on the cause. Treatment may focus on treating an underlying condition that is causing the insomnia. Treatment may also include: ?Medicines to help you sleep. ?Counseling or therapy. ?Lifestyle adjustments to help you sleep better. ?Follow these instructions at home: ?Eating and drinking ? ?Limit or avoid alcohol, caffeinated beverages, and products that contain nicotine and tobacco, especially close to bedtime. These can disrupt your sleep. ?Do not eat a large meal or eat spicy foods right before bedtime. This can lead to digestive discomfort that can make it hard for you to sleep. ?Sleep habits ? ?Keep a sleep diary to help you and your health care provider figure out what could be causing your insomnia. Write down: ?When you sleep. ?When you wake up during the night. ?How well you sleep and how rested you feel the next day. ?Any side effects of medicines you are taking. ?What you eat and drink. ?Make your bedroom a dark, comfortable place where it is easy to fall asleep. ?Put up shades or blackout curtains to block light from outside. ?Use a white noise machine to block noise. ?Keep the temperature cool. ?Limit screen use before bedtime. This includes: ?Not watching TV. ?Not using your smartphone, tablet, or computer. ?Stick to a routine that includes going to bed and waking up at the same times every day and night. This can help you fall asleep faster. Consider making  a quiet activity, such as reading, part of your nighttime routine. ?Try to avoid taking naps during the day  so that you sleep better at night. ?Get out of bed if you are still awake after 15 minutes of trying to sleep. Keep the lights down, but try reading or doing a quiet activity. When you feel sleepy, go back to bed. ?General instructions ?Take over-the-counter and prescription medicines only as told by your health care provider. ?Exercise regularly as told by your health care provider. However, avoid exercising in the hours right before bedtime. ?Use relaxation techniques to manage stress. Ask your health care provider to suggest some techniques that may work well for you. These may include: ?Breathing exercises. ?Routines to release muscle tension. ?Visualizing peaceful scenes. ?Make sure that you drive carefully. Do not drive if you feel very sleepy. ?Keep all follow-up visits. This is important. ?Contact a health care provider if: ?You are tired throughout the day. ?You have trouble in your daily routine due to sleepiness. ?You continue to have sleep problems, or your sleep problems get worse. ?Get help right away if: ?You have thoughts about hurting yourself or someone else. ?Get help right away if you feel like you may hurt yourself or others, or have thoughts about taking your own life. Go to your nearest emergency room or: ?Call 911. ?Call the [facility] at [phone] or 988. This is open 24 hours a day. ?Text the Crisis Text Line at 971-469-2291. ?Summary ?Insomnia is a sleep disorder that makes it difficult to fall asleep or stay asleep. ?Insomnia can be long-term (chronic) or short-term (acute). ?Treatment for insomnia depends on the cause. Treatment may focus on treating an underlying condition that is causing the insomnia. ?Keep a sleep diary to help you and your health care provider figure out what could be causing your insomnia. ?This information is not intended to replace advice given to you by your health care provider. Make sure you discuss any questions you have with your  health care provider. ?Document Revised: 10/15/2021 Document Reviewed: 10/15/2021 ?Elsevier Patient Education ? [place]. ? ?Pneumococcal Conjugate Vaccine (Prevnar 20) Suspension for Injection ?What is this medication? ?PNEUMOCOCCAL VACCINE (NEU mo KOK al vak SEEN) is a vaccine. It prevents pneumococcus bacterial infections. These bacteria can cause serious infections like pneumonia, meningitis, and blood infections. This vaccine will not treat an infection and will not cause infection. This vaccine is recommended for adults 18 years and older. ?This medicine may be used for other purposes; ask your health care provider or pharmacist if you have questions. ?COMMON BRAND NAME(S): Prevnar 20 ?What should I tell my care team before I take this medication? ?They need to know if you have any of these conditions: ?bleeding disorder ?fever ?immune system problems ?an unusual or allergic reaction to pneumococcal vaccine, diphtheria toxoid, other vaccines, other medicines, foods, dyes, or preservatives ?pregnant or trying to get pregnant ?breast-feeding ?How should I use this medication? ?This vaccine is injected into a muscle. It is given by a health care provider. ?A copy of Vaccine Information Statements will be given before each vaccination. Be sure to read this information carefully each time. This sheet may change often. ?Talk to your health care provider about the use of this medicine in children. Special care may be needed. ?Overdosage: If you think you have taken too much of this medicine contact a poison control center or emergency room at once. ?NOTE: This medicine is only for you. Do not share  this medicine with others. ?What if I miss a dose? ?This does not apply. This medicine is not for regular use. ?What may interact with this medication? ?medicines for cancer chemotherapy ?medicines that suppress your immune function ?steroid medicines like prednisone or cortisone ?This list may not describe all  possible interactions. Give your health care provider a list of all the medicines, herbs, non-prescription drugs, or dietary supplements you use. Also tell them if you smoke, drink alcohol, or use illegal drugs. Som

## 2022-03-13 ENCOUNTER — Other Ambulatory Visit: Payer: Self-pay | Admitting: Internal Medicine

## 2022-03-13 DIAGNOSIS — E876 Hypokalemia: Secondary | ICD-10-CM

## 2022-03-13 LAB — CBC WITH DIFFERENTIAL/PLATELET
Basophils Absolute: 0.1 10*3/uL (ref 0.0–0.1)
Basophils Relative: 0.7 % (ref 0.0–3.0)
Eosinophils Absolute: 0.1 10*3/uL (ref 0.0–0.7)
Eosinophils Relative: 1.7 % (ref 0.0–5.0)
HCT: 43.1 % (ref 36.0–46.0)
Hemoglobin: 14.4 g/dL (ref 12.0–15.0)
Lymphocytes Relative: 19.9 % (ref 12.0–46.0)
Lymphs Abs: 1.4 10*3/uL (ref 0.7–4.0)
MCHC: 33.5 g/dL (ref 30.0–36.0)
MCV: 91.7 fl (ref 78.0–100.0)
Monocytes Absolute: 0.6 10*3/uL (ref 0.1–1.0)
Monocytes Relative: 9 % (ref 3.0–12.0)
Neutro Abs: 4.9 10*3/uL (ref 1.4–7.7)
Neutrophils Relative %: 68.7 % (ref 43.0–77.0)
Platelets: 249 10*3/uL (ref 150.0–400.0)
RBC: 4.7 Mil/uL (ref 3.87–5.11)
RDW: 13.1 % (ref 11.5–15.5)
WBC: 7.2 10*3/uL (ref 4.0–10.5)

## 2022-03-13 LAB — COMPREHENSIVE METABOLIC PANEL
ALT: 28 U/L (ref 0–35)
AST: 31 U/L (ref 0–37)
Albumin: 4.5 g/dL (ref 3.5–5.2)
Alkaline Phosphatase: 54 U/L (ref 39–117)
BUN: 13 mg/dL (ref 6–23)
CO2: 32 mEq/L (ref 19–32)
Calcium: 9.4 mg/dL (ref 8.4–10.5)
Chloride: 92 mEq/L — ABNORMAL LOW (ref 96–112)
Creatinine, Ser: 0.78 mg/dL (ref 0.40–1.20)
GFR: 76.87 mL/min (ref >60.00)
Glucose, Bld: 101 mg/dL — ABNORMAL HIGH (ref 70–99)
Potassium: 3.1 mEq/L — ABNORMAL LOW (ref 3.5–5.1)
Sodium: 136 mEq/L (ref 135–145)
Total Bilirubin: 0.5 mg/dL (ref 0.2–1.2)
Total Protein: 7.7 g/dL (ref 6.0–8.3)

## 2022-03-13 LAB — LIPID PANEL
Cholesterol: 191 mg/dL (ref 0–200)
HDL: 55.9 mg/dL (ref >39.00)
LDL Cholesterol: 110 mg/dL — ABNORMAL HIGH (ref 0–99)
NonHDL: 134.8
Total CHOL/HDL Ratio: 3
Triglycerides: 123 mg/dL (ref 0.0–149.0)
VLDL: 24.6 mg/dL (ref 0.0–40.0)

## 2022-03-13 LAB — HEMOGLOBIN A1C: Hgb A1c MFr Bld: 6.3 % (ref 4.6–6.5)

## 2022-03-13 LAB — VITAMIN D 25 HYDROXY (VIT D DEFICIENCY, FRACTURES): VITD: 29.6 ng/mL — ABNORMAL LOW (ref 30.00–100.00)

## 2022-03-13 LAB — TSH: TSH: 1.04 u[IU]/mL (ref 0.35–5.50)

## 2022-03-13 MED ORDER — POTASSIUM CHLORIDE CRYS ER 20 MEQ PO TBCR: EXTENDED_RELEASE_TABLET | ORAL | 3 refills | Status: DC

## 2022-03-14 ENCOUNTER — Ambulatory Visit (INDEPENDENT_AMBULATORY_CARE_PROVIDER_SITE_OTHER): Payer: Medicare Other | Admitting: Cardiology

## 2022-03-14 ENCOUNTER — Encounter: Payer: Self-pay | Admitting: Cardiology

## 2022-03-14 VITALS — BP 143/72 | HR 62 | Ht 59.0 in | Wt 210.0 lb

## 2022-03-14 DIAGNOSIS — I10 Essential (primary) hypertension: Secondary | ICD-10-CM

## 2022-03-14 DIAGNOSIS — E785 Hyperlipidemia, unspecified: Secondary | ICD-10-CM | POA: Diagnosis not present

## 2022-03-14 DIAGNOSIS — R002 Palpitations: Secondary | ICD-10-CM

## 2022-03-14 DIAGNOSIS — I700 Atherosclerosis of aorta: Secondary | ICD-10-CM | POA: Diagnosis not present

## 2022-03-14 DIAGNOSIS — R011 Cardiac murmur, unspecified: Secondary | ICD-10-CM | POA: Diagnosis not present

## 2022-03-14 DIAGNOSIS — Z6841 Body Mass Index (BMI) 40.0 and over, adult: Secondary | ICD-10-CM | POA: Diagnosis not present

## 2022-03-14 NOTE — Patient Instructions (Signed)
Medication Instructions:  ?- Your physician recommends that you continue on your current medications as directed. Please refer to the Current Medication list given to you today. ? ?*If you need a refill on your cardiac medications before your next appointment, please call your pharmacy* ? ? ?Lab Work: ?- none ordered ? ?If you have labs (blood work) drawn today and your tests are completely normal, you will receive your results only by: ?MyChart Message (if you have MyChart) OR ?A paper copy in the mail ?If you have any lab test that is abnormal or we need to change your treatment, we will call you to review the results. ? ? ?Testing/Procedures: ? ?1) Echocardiogram: ?- Your physician has requested that you have an echocardiogram. Echocardiography is a painless test that uses sound waves to create images of your heart. It provides your doctor with information about the size and shape of your heart and how well your heart?s chambers and valves are working. This procedure takes approximately one hour. There are no restrictions for this procedure. There is a possibility that an IV may need to be started during your test to inject an image enhancing agent. This is done to obtain more optimal pictures of your heart. Therefore we ask that you do at least drink some water prior to coming in to hydrate your veins.  ? ? ? ?Follow-Up: ?At [facility], you and your health needs are our priority.  As part of our continuing mission to provide you with exceptional heart care, we have created designated Provider Care Teams.  These Care Teams include your primary Cardiologist (physician) and Advanced Practice Providers (APPs -  Physician Assistants and Nurse Practitioners) who all work together to provide you with the care you need, when you need it. ? ?We recommend signing up for the patient portal called "MyChart".  Sign up information is provided on this After Visit Summary.  MyChart is used to connect with patients for  Virtual Visits (Telemedicine).  Patients are able to view lab/test results, encounter notes, upcoming appointments, etc.  Non-urgent messages can be sent to your provider as well.   ?To learn more about what you can do with MyChart, go to [email or web address].   ? ?Your next appointment:   ?3 month(s) ? ?The format for your next appointment:   ?In Person ? ?Provider:   ?Glenetta Hew, MD  ? ? ?Other Instructions ? ?1) Please make sure you are staying hydrated ? ?2) Kardia for monitoring of EKGs at home: ?You can look into the [facility] device by [employer].This device is purchased by you and it connects to an application you download to your smart phone.  It can detect abnormal heart rhythms and alert you to contact your doctor for further evaluation. The device is approximately $90 and the phone application is free.  ?The web site is:  https://www.alivecor.com ? ? ? ? ?Echocardiogram ?An echocardiogram is a test that uses sound waves (ultrasound) to produce images of the heart. ?Images from an echocardiogram can provide important information about: ?Heart size and shape. ?The size and thickness and movement of your heart's walls. ?Heart muscle function and strength. ?Heart valve function or if you have stenosis. Stenosis is when the heart valves are too narrow. ?If blood is flowing backward through the heart valves (regurgitation). ?A tumor or infectious growth around the heart valves. ?Areas of heart muscle that are not working well because of poor blood flow or injury from a heart attack. ?Aneurysm detection. An aneurysm  is a weak or damaged part of an artery wall. The wall bulges out from the normal force of blood pumping through the body. ?Tell a health care provider about: ?Any allergies you have. ?All medicines you are taking, including vitamins, herbs, eye drops, creams, and over-the-counter medicines. ?Any blood disorders you have. ?Any surgeries you have had. ?Any medical conditions you have. ?Whether  you are pregnant or may be pregnant. ?What are the risks? ?Generally, this is a safe test. However, problems may occur, including an allergic reaction to dye (contrast) that may be used during the test. ?What happens before the test? ?No specific preparation is needed. You may eat and drink normally. ?What happens during the test? ? ?You will take off your clothes from the waist up and put on a hospital gown. ?Electrodes or electrocardiogram (ECG)patches may be placed on your chest. The electrodes or patches are then connected to a device that monitors your heart rate and rhythm. ?You will lie down on a table for an ultrasound exam. A gel will be applied to your chest to help sound waves pass through your skin. ?A handheld device, called a transducer, will be pressed against your chest and moved over your heart. The transducer produces sound waves that travel to your heart and bounce back (or "echo" back) to the transducer. These sound waves will be captured in real-time and changed into images of your heart that can be viewed on a video monitor. The images will be recorded on a computer and reviewed by your health care provider. ?You may be asked to change positions or hold your breath for a short time. This makes it easier to get different views or better views of your heart. ?In some cases, you may receive contrast through an IV in one of your veins. This can improve the quality of the pictures from your heart. ?The procedure may vary among health care providers and hospitals. ?What can I expect after the test? ?You may return to your normal, everyday life, including diet, activities, and medicines, unless your health care provider tells you not to do that. ?Follow these instructions at home: ?It is up to you to get the results of your test. Ask your health care provider, or the department that is doing the test, when your results will be ready. ?Keep all follow-up visits. This is important. ?Summary ?An  echocardiogram is a test that uses sound waves (ultrasound) to produce images of the heart. ?Images from an echocardiogram can provide important information about the size and shape of your heart, heart muscle function, heart valve function, and other possible heart problems. ?You do not need to do anything to prepare before this test. You may eat and drink normally. ?After the echocardiogram is completed, you may return to your normal, everyday life, unless your health care provider tells you not to do that. ?This information is not intended to replace advice given to you by your health care provider. Make sure you discuss any questions you have with your health care provider. ?Document Revised: 07/18/2021 Document Reviewed: 06/27/2020 ?Elsevier Patient Education ? [place]. ? ? ?Important Information About Sugar ? ? ? ? ? ? ?
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
## 2022-03-16 ENCOUNTER — Encounter: Payer: Self-pay | Admitting: Cardiology

## 2022-03-16 NOTE — Assessment & Plan Note (Addendum)
Relatively soft systolic murmur.  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 ? ?Plan: 2D echocardiogram ?

## 2022-03-16 NOTE — Assessment & Plan Note (Signed)
Working on weight loss.  She has lost 16 pounds according to her scales.  Encouraged continued exercise. ? ?Also needs to make sure she stays adequately hydrated. ?

## 2022-03-16 NOTE — Assessment & Plan Note (Signed)
LDL 110.  This in the setting of obesity and hypertension. ? ?We talked about potential restratification.  We will first check a 2D echo and see her back in follow-up.  Can discuss possibility of Coronary Calcium Score evaluation. ?

## 2022-03-16 NOTE — Assessment & Plan Note (Signed)
Cardiovascular risk modification with blood pressure, lipid and glycemic control. ?She is on antihypertensives, but not currently on anything for lipids.  We will discuss further risk stratification with Coronary Calcium Score-if indicated would probably shoot for LDL closer to 70, less than 100. ? ?Diagnosed with prediabetes, need to closely monitor glycemic control. ?

## 2022-03-16 NOTE — Assessment & Plan Note (Signed)
A little bit elevated today, but she is somewhat stressed. ?She is on amlodipine 5 mg along with atenolol 50 mg and Maxide. ? ?Would monitor, she does have room to increase calcium channel blocker, but also could consider ACE inhibitor or ARB. ?

## 2022-03-16 NOTE — Assessment & Plan Note (Signed)
She had 1 relatively prolonged episode of irregular heartbeats and palpitations.  Unfortunately none since and none before.  I do not know that an event monitor will be all that helpful because it is unlikely that we would capture something.  We talked about home monitoring techniques: ?? Kardia for monitoring of EKGs at home. ? ?? With hypertension and murmur along with palpitations, will check 2D echo. ?

## 2022-03-22 ENCOUNTER — Ambulatory Visit
Admission: RE | Admit: 2022-03-22 | Discharge: 2022-03-22 | Disposition: A | Discharge status: Home / Self Care | Payer: Medicare Other | Source: Ambulatory Visit | Attending: Internal Medicine | Admitting: Internal Medicine

## 2022-03-22 ENCOUNTER — Other Ambulatory Visit: Payer: Self-pay | Admitting: Internal Medicine

## 2022-03-22 DIAGNOSIS — R1310 Dysphagia, unspecified: Secondary | ICD-10-CM | POA: Diagnosis not present

## 2022-03-22 DIAGNOSIS — M47812 Spondylosis without myelopathy or radiculopathy, cervical region: Secondary | ICD-10-CM | POA: Diagnosis not present

## 2022-03-22 DIAGNOSIS — E041 Nontoxic single thyroid nodule: Secondary | ICD-10-CM | POA: Diagnosis not present

## 2022-03-22 MED ORDER — IOHEXOL 300 MG/ML  SOLN
75.0000 mL | Freq: Once | INTRAMUSCULAR | Status: AC | PRN
  Administered 2022-03-22: 75 mL via INTRAVENOUS

## 2022-03-25 DIAGNOSIS — Z20822 Contact with and (suspected) exposure to covid-19: Secondary | ICD-10-CM | POA: Diagnosis not present

## 2022-03-28 ENCOUNTER — Ambulatory Visit (INDEPENDENT_AMBULATORY_CARE_PROVIDER_SITE_OTHER): Payer: Medicare Other

## 2022-03-28 DIAGNOSIS — R011 Cardiac murmur, unspecified: Secondary | ICD-10-CM | POA: Diagnosis not present

## 2022-03-28 LAB — ECHOCARDIOGRAM COMPLETE
AR max vel: 1.54 cm2
AV Area VTI: 1.45 cm2
AV Area mean vel: 1.51 cm2
AV Mean grad: 5 mmHg
AV Peak grad: 8.5 mmHg
Ao pk vel: 1.46 m/s
Area-P 1/2: 2.64 cm2
Calc EF: 64.7 %
S' Lateral: 2.8 cm
Single Plane A2C EF: 67.7 %
Single Plane A4C EF: 62.3 %

## 2022-04-08 ENCOUNTER — Telehealth: Payer: Self-pay | Admitting: Internal Medicine

## 2022-04-08 NOTE — Telephone Encounter (Signed)
Lft pt vm to call ofc . thanks 

## 2022-04-11 IMAGING — DX DG LUMBAR SPINE COMPLETE 4+V
5 series · 5 of 5 positions shown · non-contrast
Comparison: None.

CLINICAL DATA: Low back pain with walking for 6-8 months.

EXAM:
LUMBAR SPINE - COMPLETE 4+ VIEW

[lumbar spine ap]
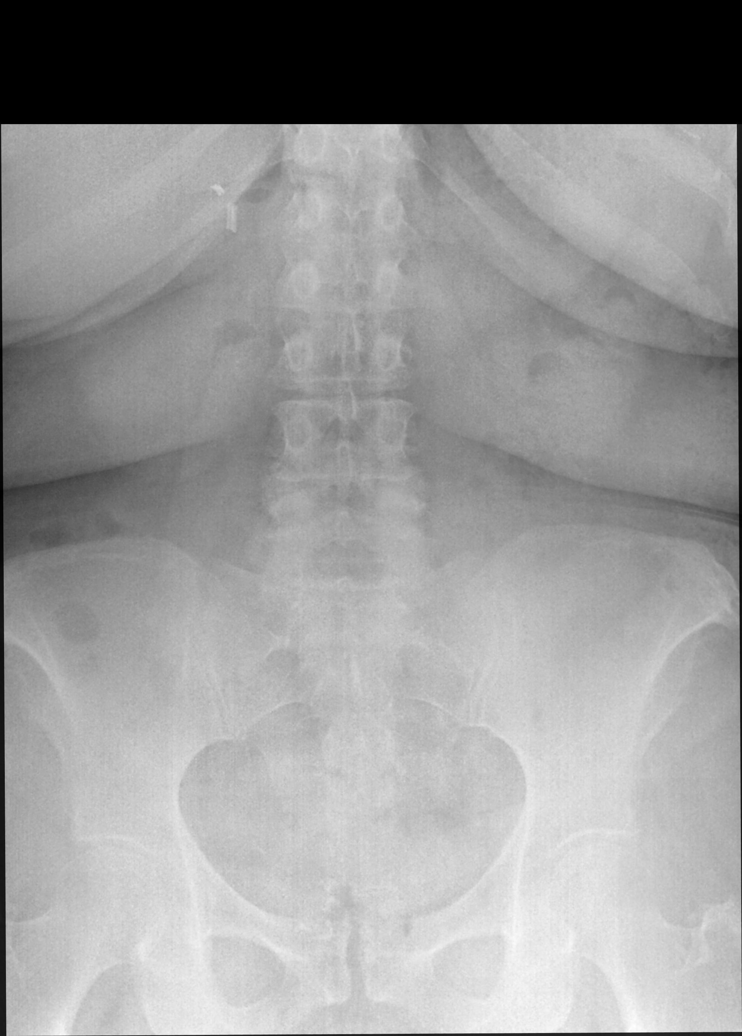

[lumbar spine obl (oblique) (1 of 2)]
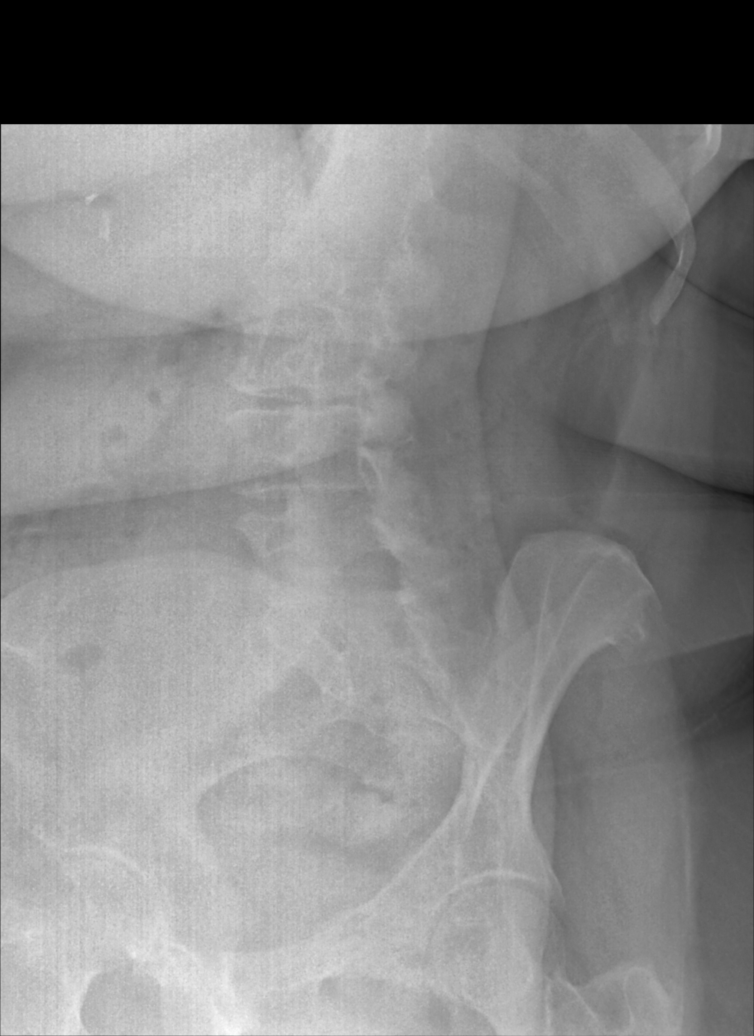

[lumbar spine obl (oblique) (2 of 2)]
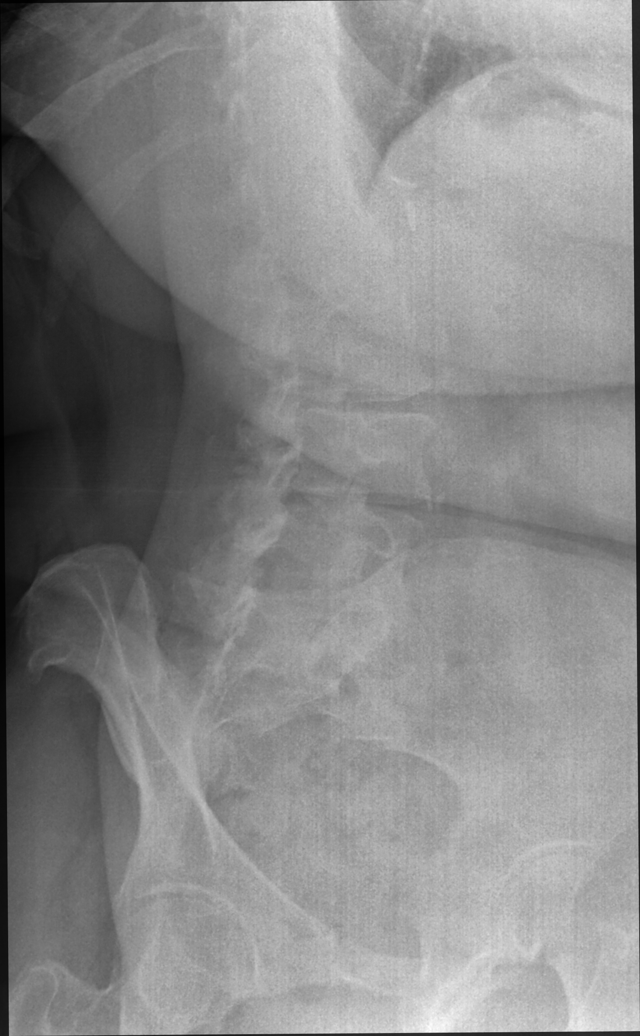

[lumbar spine lat]
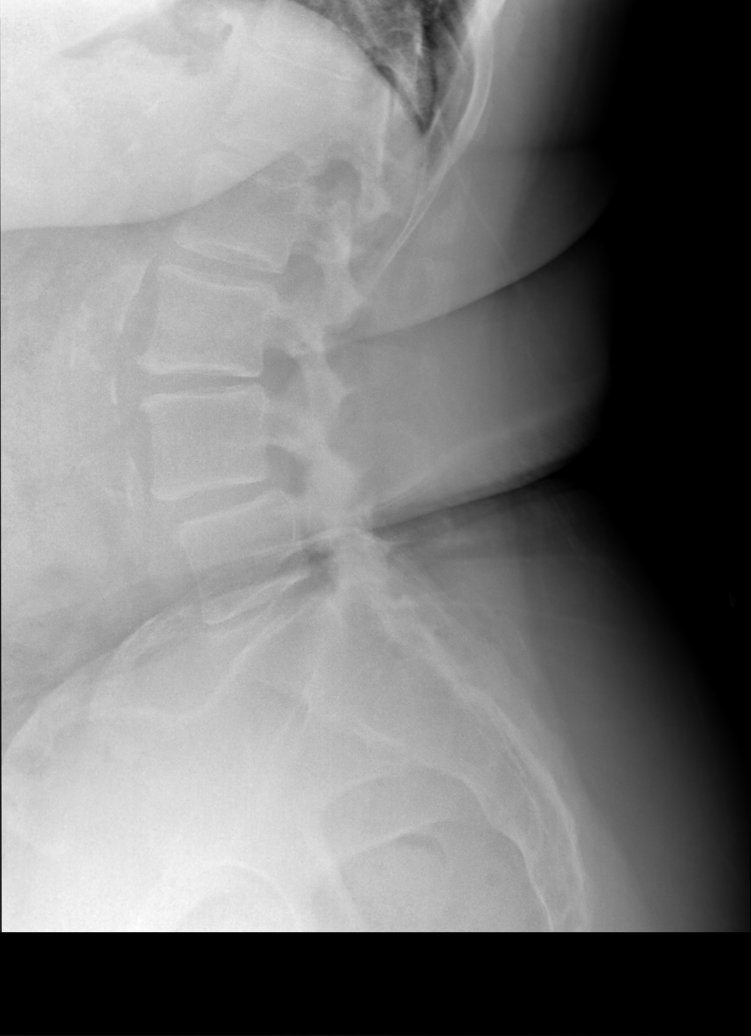

[lumbar spot lat]
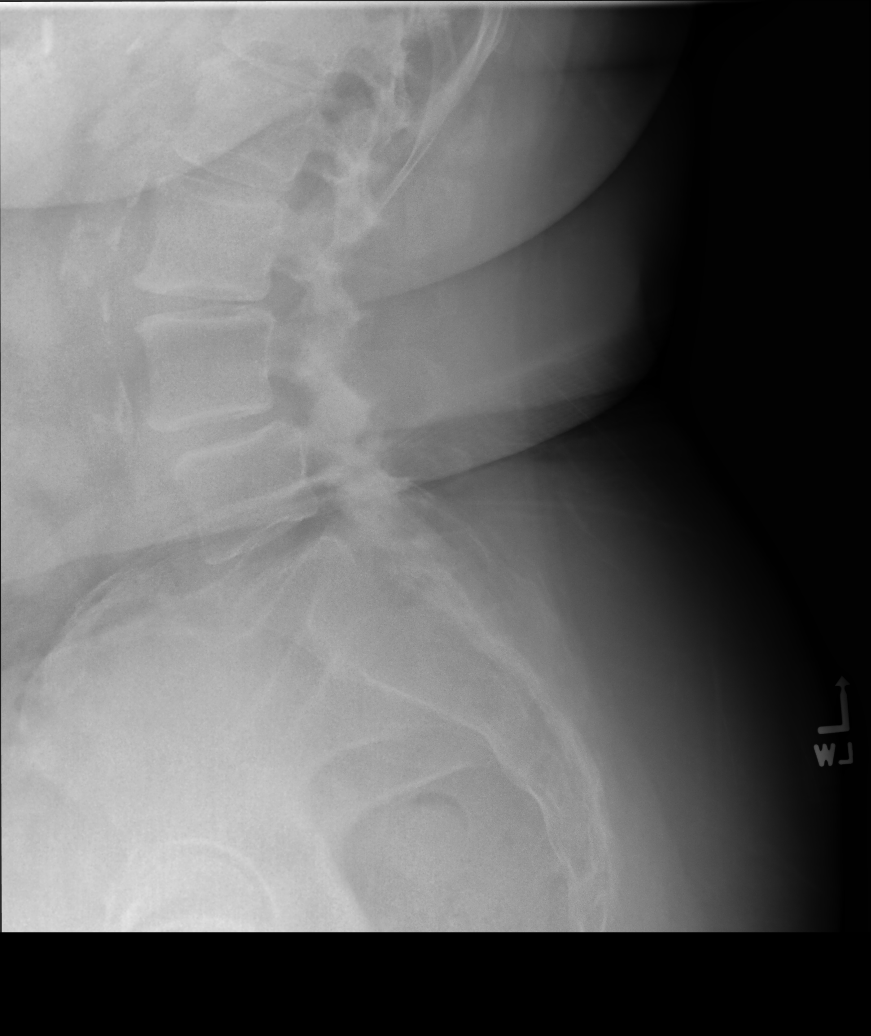

[5 of 5 positions shown; findings below may reference images not displayed]

FINDINGS: Cholecystectomy. Maintenance of vertebral body height and alignment.
Mild loss of intervertebral disc height at L3-4. Facet arthropathy
at L4-5 and L5-S1. Aortic atherosclerosis.
IMPRESSION: Spondylosis, without acute osseous finding.

Aortic Atherosclerosis ([id number]).

## 2022-04-16 NOTE — Telephone Encounter (Signed)
Pt returning call

## 2022-04-25 ENCOUNTER — Ambulatory Visit
Admission: RE | Admit: 2022-04-25 | Discharge: 2022-04-25 | Disposition: A | Discharge status: Home / Self Care | Payer: Medicare Other | Source: Ambulatory Visit | Attending: Internal Medicine | Admitting: Internal Medicine

## 2022-04-25 DIAGNOSIS — E041 Nontoxic single thyroid nodule: Secondary | ICD-10-CM | POA: Diagnosis not present

## 2022-04-26 ENCOUNTER — Telehealth: Payer: Self-pay

## 2022-04-26 ENCOUNTER — Other Ambulatory Visit: Payer: Medicare Other

## 2022-04-26 ENCOUNTER — Telehealth: Payer: Self-pay | Admitting: *Deleted

## 2022-04-26 ENCOUNTER — Other Ambulatory Visit: Payer: Self-pay | Admitting: *Deleted

## 2022-04-26 DIAGNOSIS — Z1389 Encounter for screening for other disorder: Secondary | ICD-10-CM | POA: Diagnosis not present

## 2022-04-26 DIAGNOSIS — E119 Type 2 diabetes mellitus without complications: Secondary | ICD-10-CM

## 2022-04-26 DIAGNOSIS — R011 Cardiac murmur, unspecified: Secondary | ICD-10-CM

## 2022-04-26 NOTE — Telephone Encounter (Signed)
Pt has seen results via mychart  on 04/25/2022  5:57 PM  Lvm for pt to return call in regards to [place] results  Per Dr.Tracy: Thyroid nodule right and left thyroid meets criteria for biopsy  Is she agreeable to go to [place] for this?    She needs to see endocrine and likely radiology to bx

## 2022-04-26 NOTE — Telephone Encounter (Signed)
Pt returned call and I read message to her. Pt is agreeable to going to [facility]

## 2022-04-27 LAB — URINALYSIS, ROUTINE W REFLEX MICROSCOPIC
Bacteria, UA: NONE SEEN /HPF
Bilirubin Urine: NEGATIVE
Glucose, UA: NEGATIVE
Hgb urine dipstick: NEGATIVE
Hyaline Cast: NONE SEEN /LPF
Ketones, ur: NEGATIVE
Nitrite: NEGATIVE
Protein, ur: NEGATIVE
RBC / HPF: NONE SEEN /HPF (ref 0–2)
Specific Gravity, Urine: 1.012 (ref 1.001–1.035)
pH: 6 (ref 5.0–8.0)

## 2022-04-27 LAB — MICROALBUMIN / CREATININE URINE RATIO
Creatinine, Urine: 80 mg/dL (ref 20–275)
Microalb Creat Ratio: 4 mcg/mg creat (ref <30)
Microalb, Ur: 0.3 mg/dL

## 2022-04-27 LAB — MICROSCOPIC MESSAGE

## 2022-04-28 NOTE — Addendum Note (Signed)
Addended by: Orland Mustard on: 04/28/2022 09:53 PM   Modules accepted: Orders

## 2022-05-17 ENCOUNTER — Ambulatory Visit (INDEPENDENT_AMBULATORY_CARE_PROVIDER_SITE_OTHER): Payer: Medicare Other

## 2022-05-17 VITALS — Ht 59.0 in | Wt 210.0 lb

## 2022-05-17 DIAGNOSIS — Z0000 Encounter for general adult medical examination without abnormal findings: Secondary | ICD-10-CM

## 2022-05-17 NOTE — Patient Instructions (Addendum)
  Ms. Wismer , Thank you for taking time to come for your Medicare Wellness Visit. I appreciate your ongoing commitment to your health goals. Please review the following plan we discussed and let me know if I can assist you in the future.   These are the goals we discussed:  Goals      Increase physical activity     Walk for exercise, as tolerated        This is a list of the screening recommended for you and due dates:  Health Maintenance  Topic Date Due   COVID-19 Vaccine (3 - Moderna risk series) 06/02/2022*   Eye exam for diabetics  07/19/2022*   Zoster (Shingles) Vaccine (1 of 2) 08/17/2022*   Complete foot exam   08/18/2022*   Pneumonia Vaccine (1 - PCV) 05/18/2023*   Tetanus Vaccine  05/18/2023*   Flu Shot  06/18/2022   Hemoglobin A1C  09/11/2022   Urine Protein Check  04/27/2023   Mammogram  08/21/2023   Cologuard (Stool DNA test)  02/18/2024   DEXA scan (bone density measurement)  Completed   Hepatitis C Screening: USPSTF Recommendation to screen - Ages 76-79 yo.  Completed   HPV Vaccine  Aged Out  *Topic was postponed. The date shown is not the original due date.
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
## 2022-06-12 DIAGNOSIS — Z8349 Family history of other endocrine, nutritional and metabolic diseases: Secondary | ICD-10-CM | POA: Diagnosis not present

## 2022-06-12 DIAGNOSIS — E042 Nontoxic multinodular goiter: Secondary | ICD-10-CM | POA: Diagnosis not present

## 2022-06-13 ENCOUNTER — Ambulatory Visit: Payer: Medicare Other | Admitting: Cardiology

## 2022-06-13 ENCOUNTER — Encounter: Payer: Self-pay | Admitting: Internal Medicine

## 2022-06-13 DIAGNOSIS — E042 Nontoxic multinodular goiter: Secondary | ICD-10-CM | POA: Insufficient documentation

## 2022-06-19 ENCOUNTER — Other Ambulatory Visit: Payer: Self-pay | Admitting: Internal Medicine

## 2022-06-19 DIAGNOSIS — E042 Nontoxic multinodular goiter: Secondary | ICD-10-CM

## 2022-07-09 ENCOUNTER — Telehealth: Payer: Self-pay

## 2022-07-09 NOTE — Telephone Encounter (Signed)
That is fine with me.

## 2022-07-09 NOTE — Telephone Encounter (Signed)
Shes a sweet pt ok to transfer to D.r Caryl Bis  Please schedule

## 2022-07-09 NOTE — Telephone Encounter (Signed)
At check-out for his appointment, patient's husband, Keyetta Hollingworth, states patient received notification that Dr. Olivia Mackie McLean-Scocuzza will be leaving and she would like to transfer her care to Dr. Tommi Rumps.

## 2022-07-10 NOTE — Telephone Encounter (Signed)
I left message for patient letting her know Dr. Tommi Rumps will accept her as his patient and asked her to please call our office to schedule her transfer of care appointment.

## 2022-07-19 ENCOUNTER — Telehealth: Payer: Self-pay

## 2022-07-19 NOTE — Telephone Encounter (Signed)
Patient states she would like to have referrals for eyes, ear, nose, and throat, and a doctor who takes care of nerves in legs.  Patient states her rOPINIRole (REQUIP) 0.25 MG tablet has helped a little bit, but has not solved the problem.  Patient states her legs are sometimes tingling during the day too.  Patient states her MyChart is not working right and requests that we please call her.  *Patient states her preferred pharmacy is [facility].

## 2022-07-22 NOTE — Telephone Encounter (Signed)
Why does she want referral to ENT again?  Also neurology would be who can help legs [facility] if she is ok with this? I will refer and she will need nerve conduction studies/emg test which can be painful, is she ok with this procedure?   Does she want to increase the dose of requip to 0.5 ?   If she is having tingling in legs she may have neuropathy or this could be referred from her back but the test above with neurology can tell what is better going on.

## 2022-07-23 ENCOUNTER — Telehealth: Payer: Self-pay

## 2022-07-23 NOTE — Telephone Encounter (Signed)
LMOM for pt to CB.  

## 2022-07-23 NOTE — Telephone Encounter (Signed)
LMOM to CB in regards to :   [name], Jennifer Glow, MD  You 13 hours ago (10:45 PM)   TM Why does she want referral to ENT again?  Also neurology would be who can help legs [facility] if she is ok with this? I will refer and she will need nerve conduction studies/emg test which can be painful, is she ok with this procedure?    Does she want to increase the dose of requip to 0.5 ?    If she is having tingling in legs she may have neuropathy or this could be referred from her back but the test above with neurology can tell what is better going on.

## 2022-07-25 ENCOUNTER — Telehealth: Payer: Self-pay

## 2022-07-25 NOTE — Telephone Encounter (Signed)
Patient states she is returning Gracy Racer, CMA's call.  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  Patient states the doctor we referred her to wanted to stick her throat with a needle 15 times to biopsy without putting her to sleep and she is not interested in that.  Patient states she is coughing and having trouble swallowing but there is no sign of anything on the scan.  Patient states it feels like something is stuck.  Patient states she had her top teeth pulled and she has swallowed some of the stuff that holds her dentures in place and she wonders if that may be what she is feeling in her throat.  Patient states she would like to be referred to an ENT doctor.  Patient states she is not interested in a referral to a neurologist at this time.  Patient states she will try a patch stimulator for the nerves on her legs first to see if it helps.  Patient states she would like to have an increase in her rOPINIRole (REQUIP) 0.25 MG tablet.  Patient states she would like to know if she should take two pills instead of one.  Patient states we may leave a voicemail for her when we call back if she is unable to get to the phone.

## 2022-07-28 ENCOUNTER — Other Ambulatory Visit: Payer: Self-pay | Admitting: Internal Medicine

## 2022-07-28 DIAGNOSIS — G2581 Restless legs syndrome: Secondary | ICD-10-CM

## 2022-07-28 MED ORDER — ROPINIROLE HCL 0.5 MG PO TABS: 0.5000 mg | ORAL_TABLET | Freq: Every day | ORAL | 3 refills | Status: DC

## 2022-07-28 NOTE — Telephone Encounter (Signed)
Referred to [facility] in [place] give pt # to call as well and address  Referred to [facility] as well for trouble swallowing   Pt can take 1 requip 0.5 mg sent to mail order or if has 0.25 left she can take 2   Consider neurology in the future   Take care let her know I will be leaving 08/30/22  She can stay at our location or see Dr. Billey Gosling in [place] let me know what she wants to do

## 2022-07-28 NOTE — Addendum Note (Signed)
Addended by: Or[place] Mustard on: 07/28/2022 11:41 PM   Modules accepted: Orders

## 2022-07-29 ENCOUNTER — Telehealth: Payer: Self-pay

## 2022-07-29 NOTE — Telephone Encounter (Signed)
LMOM for pt to CB.  

## 2022-07-29 NOTE — Telephone Encounter (Signed)
LMOM for pt to CB to update that she can see Dr. Caryl Bis

## 2022-07-29 NOTE — Telephone Encounter (Signed)
LMOM for pt to CB in regards to her TOC. Pt wanted to see Dr. Caryl Bis:   Caryl Bis, Angela Adam, MD  Cyndi Lennert; McLean-Scocuzza, Nino Glow, MD 10 minutes ago (3:53 PM)    She can be scheduled with me.     McLean-Scocuzza, Nino Glow, MD  Leone Haven, MD; Walker Shadow P 15 minutes ago (3:49 PM)   TM Are you agreeable to see this sweet pt TOC her husband sees you?  If Dr. Caryl Bis agreeable Maudie Mercury please sch TOC appt        Note     You  McLean-Scocuzza, Nino Glow, MD 26 minutes ago (3:37 PM)    Pt has been informed. Pt stated that she has been taking 0.25 of the requip for a week and it has helped a lot. Pt stated she would like to be seen by Dr. Caryl Bis if he has openings as her husband sees him. Pt stated if Dr. Caryl Bis does not have any opening for new pts that she would like to be seen by her mothers provider at [place]. Pt stated that she lives in [place] and [place] drive.

## 2022-07-29 NOTE — Telephone Encounter (Signed)
LMOM for pt to CB in regards to some info from Dr. Olivia Mackie:   McLean-Scocuzza, Nino Glow, MD  You; Marcelene Butte, Rasheedah R 10 hours ago (11:42 PM)   TM Referred to [facility] in [place] give pt # to call as well and address  Referred to [facility] as well for trouble swallowing    Pt can take 1 requip 0.5 mg sent to mail order or if has 0.25 left she can take 2    Consider neurology in the future    Take care let her know I will be leaving 08/30/22  She can stay at our location or see Dr. Billey Gosling in [place] let me know what she wants to do            [facility]:  Phone #: [phone]  Address: [address], [place], [place] [address]

## 2022-07-29 NOTE — Telephone Encounter (Signed)
Are you agreeable to see this sweet pt TOC her husband sees you?  If Dr. Caryl Bis agreeable Jennifer [name] please sch TOC appt

## 2022-08-15 DIAGNOSIS — R1310 Dysphagia, unspecified: Secondary | ICD-10-CM | POA: Diagnosis not present

## 2022-08-15 DIAGNOSIS — E041 Nontoxic single thyroid nodule: Secondary | ICD-10-CM | POA: Diagnosis not present

## 2022-08-21 DIAGNOSIS — Z23 Encounter for immunization: Secondary | ICD-10-CM | POA: Diagnosis not present

## 2022-08-26 DIAGNOSIS — Z1231 Encounter for screening mammogram for malignant neoplasm of breast: Secondary | ICD-10-CM | POA: Diagnosis not present

## 2022-08-26 LAB — HM MAMMOGRAPHY

## 2022-08-27 ENCOUNTER — Encounter: Payer: Medicare Other | Admitting: Family Medicine

## 2022-08-29 ENCOUNTER — Encounter: Payer: Self-pay | Admitting: Internal Medicine

## 2022-09-12 ENCOUNTER — Ambulatory Visit: Payer: Medicare Other | Admitting: Internal Medicine

## 2022-09-30 ENCOUNTER — Telehealth: Payer: Self-pay | Admitting: Internal Medicine

## 2022-09-30 NOTE — Telephone Encounter (Signed)
Pt called asking if Dr. Damita Dunnings would accept her as new pt? Pt's mother, Ashley Jacobs is duncan's pt. Call back # [phone]

## 2022-10-01 ENCOUNTER — Encounter: Payer: Self-pay | Admitting: Dermatology

## 2022-10-01 ENCOUNTER — Ambulatory Visit (INDEPENDENT_AMBULATORY_CARE_PROVIDER_SITE_OTHER): Payer: Medicare Other | Admitting: Dermatology

## 2022-10-01 DIAGNOSIS — D225 Melanocytic nevi of trunk: Secondary | ICD-10-CM | POA: Diagnosis not present

## 2022-10-01 DIAGNOSIS — L659 Nonscarring hair loss, unspecified: Secondary | ICD-10-CM | POA: Diagnosis not present

## 2022-10-01 DIAGNOSIS — L578 Other skin changes due to chronic exposure to nonionizing radiation: Secondary | ICD-10-CM

## 2022-10-01 DIAGNOSIS — Z86018 Personal history of other benign neoplasm: Secondary | ICD-10-CM | POA: Diagnosis not present

## 2022-10-01 DIAGNOSIS — L650 Telogen effluvium: Secondary | ICD-10-CM | POA: Diagnosis not present

## 2022-10-01 DIAGNOSIS — L649 Androgenic alopecia, unspecified: Secondary | ICD-10-CM

## 2022-10-01 DIAGNOSIS — D235 Other benign neoplasm of skin of trunk: Secondary | ICD-10-CM | POA: Diagnosis not present

## 2022-10-01 DIAGNOSIS — R21 Rash and other nonspecific skin eruption: Secondary | ICD-10-CM | POA: Diagnosis not present

## 2022-10-01 DIAGNOSIS — D239 Other benign neoplasm of skin, unspecified: Secondary | ICD-10-CM

## 2022-10-01 DIAGNOSIS — L280 Lichen simplex chronicus: Secondary | ICD-10-CM

## 2022-10-01 DIAGNOSIS — D485 Neoplasm of uncertain behavior of skin: Secondary | ICD-10-CM

## 2022-10-01 MED ORDER — CLOBETASOL PROPIONATE 0.05 % EX SOLN: 1.0000 | Freq: Two times a day (BID) | CUTANEOUS | 0 refills | Status: DC

## 2022-10-01 NOTE — Progress Notes (Signed)
Follow-Up Visit   Subjective  Jennifer [name] is a 71 y.o. female who presents for the following: Alopecia (Patient not using anything for hair loss. ), Nevus (Patient was scheduled for excision of bx proven DYSPLASTIC NEVUS WITH MODERATE TO SEVERE ATYPIA at low back right of midline twice but cancelled both surgeries. Also with a moderately dysplastic nevus at left upper back.), and Pruritis (Patient c/o itch at scalp and back, wakes her up at night.).  Patient accompanied by husband who contributes to history.   The following portions of the chart were reviewed this encounter and updated as appropriate:   Tobacco  Allergies  Meds  Problems  Med Hx  Surg Hx  Fam Hx      Review of Systems:  No other skin or systemic complaints except as noted in HPI or Assessment and Plan.  Objective  Well appearing patient in no apparent distress; mood and affect are within normal limits.  All skin waist up examined.  low back right of midline Clear   Left Upper Back Clear   Scalp Scale and itch at scalp, miniaturization, positive hair pull test R/o Tinea Capitis vs Hypersensitivity vs Seb Derm with concomitant androgenetic alopecia and/or telogen effluvium  Horizontal and Vertical sections       back Excoriated pink papules scattered at back   Scalp Miniaturization c/w age related hair loss.   Scalp Diffuse thinning of hair, positive hair pull test.   Right back right of midline 0.4 cm irregular dark brown thin papule         Assessment & Plan  Dysplastic nevus low back right of midline  H/o mod to severe atypia. Pt did not come for excision  Will schedule for shave removal    History of dysplastic nevus Left Upper Back  History of moderate atypia - Recommend regular full body skin exams - Recommend daily broad spectrum sunscreen SPF 30+ to sun-exposed areas, reapply every 2 hours as needed.  - Call if any new or changing lesions are noted between  office visits   Alopecia Scalp  Skin / nail biopsy - Scalp Type of biopsy: punch   Informed consent: discussed and consent obtained   Timeout: patient name, date of birth, surgical site, and procedure verified   Procedure prep:  Patient was prepped and draped in usual sterile fashion Prep type:  Isopropyl alcohol Anesthesia: the lesion was anesthetized in a standard fashion   Anesthetic:  1% lidocaine w/ epinephrine 1-100,000 buffered w/ 8.4% NaHCO3 Punch size:  4 mm Suture size:  4-0 Suture type: Prolene (polypropylene)   Suture removal (days):  14 Hemostasis achieved with: suture   Outcome: patient tolerated procedure well   Post-procedure details: wound care instructions given   Additional details:  Petrolatum and a pressure dressing were applied  Specimen 2 - Surgical pathology Differential Diagnosis: R/o Tinea Capitis vs Hypersensitivity vs Seb Derm with concomitant androgenetic alopecia and/or telogen effluvium  Check Margins: No Scale and itch at scalp, miniaturization, positive hair pull test Horizontal and Vertical sections  Rash back  Eczema Skin Care  Buy TWO 16oz jars of CeraVe moisturizing cream  [facility], [facility], [facility] (no prescription needed)  Costs about $15 per jar   Jar #1: Use as a moisturizer as needed. Can be applied to any area of the body. Use twice daily to unaffected areas.  Jar #2: Pour one 19m bottle of clobetasol 0.05% solution into jar, mix well. Label this jar to indicate the medication has  been added. Use twice daily to affected areas. Do not apply to face, groin or underarms.  Moisturizer may burn or sting initially. Try for at least 4 weeks.   Will plan biopsy on follow up to r/o Hypersensivtivity vs eczema vs contact derm if not better  clobetasol (TEMOVATE) 0.05 % external solution - back Apply 1 Application topically 2 (two) times daily. 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 Will discuss treatment and review labs at follow-up  Neoplasm of uncertain behavior of skin Right back right of midline  Epidermal / dermal shaving  Lesion diameter (cm):  0.4 Informed consent: discussed and consent obtained   Timeout: patient name, date of birth, surgical site, and procedure verified   Anesthesia: the lesion was anesthetized in a standard fashion   Anesthetic:  1% lidocaine w/ epinephrine 1-100,000 local infiltration Instrument used: flexible razor blade   Hemostasis achieved with: aluminum chloride   Outcome: patient tolerated procedure well   Post-procedure details: wound care instructions given   Additional details:  Mupirocin and a bandage applied  Specimen 1 - Surgical pathology Differential Diagnosis: r/o Atypia   Check Margins: No 0.4 cm irregular dark brown thin papule  Actinic Damage - chronic, secondary to cumulative UV radiation exposure/sun exposure over time - diffuse scaly erythematous macules with underlying dyspigmentation - Recommend daily broad spectrum sunscreen SPF 30+ to sun-exposed areas, reapply every 2 hours as needed.  - Recommend staying in the shade or wearing long sleeves, sun glasses (UVA+UVB protection) and wide brim hats (4-inch brim around the entire circumference of the hat). - Call for new or changing lesions.  No follow-ups on file.  Graciella Belton, RMA, am acting as scribe for Forest Gleason, MD .  Documentation: I have reviewed the above documentation for accuracy and completeness, and I agree with the above.  Forest Gleason, MD

## 2022-10-01 NOTE — Patient Instructions (Addendum)
Wound Care Instructions  Cleanse wound gently with soap and water once a day then pat dry with clean gauze. Apply a thin coat of Petrolatum (petroleum jelly, "Vaseline") over the wound (unless you have an allergy to this). We recommend that you use a new, sterile tube of Vaseline. Do not pick or remove scabs. Do not remove the yellow or white "healing tissue" from the base of the wound.  Cover the wound with fresh, clean, nonstick gauze and secure with paper tape. You may use Band-Aids in place of gauze and tape if the wound is small enough, but would recommend trimming much of the tape off as there is often too much. Sometimes Band-Aids can irritate the skin.  You should call the office for your biopsy report after 1 week if you have not already been contacted.  If you experience any problems, such as abnormal amounts of bleeding, swelling, significant bruising, significant pain, or evidence of infection, please call the office immediately.  FOR ADULT SURGERY PATIENTS: If you need something for pain relief you may take 1 extra strength Tylenol (acetaminophen) AND 2 Ibuprofen ('200mg'$  each) together every 4 hours as needed for pain. (do not take these if you are allergic to them or if you have a reason you should not take them.) Typically, you may only need pain medication for 1 to 3 days.   Eczema Skin Care  Buy TWO 16oz jars of CeraVe moisturizing cream  [facility], [facility], [facility] (no prescription needed)  Costs about $15 per jar   Jar #1: Use as a moisturizer as needed. Can be applied to any area of the body. Use twice daily to unaffected areas.  Jar #2: Pour one 54m bottle of clobetasol 0.05% solution into jar, mix well. Label this jar to indicate the medication has been added. Use twice daily to affected areas. Do not apply to face, groin or underarms.  Moisturizer may burn or sting initially. Try for at least 4 weeks.   Due to recent changes in healthcare laws, you may see results of your  pathology and/or laboratory studies on MyChart before the doctors have had a chance to review them. We understand that in some cases there may be results that are confusing or concerning to you. Please understand that not all results are received at the same time and often the doctors may need to interpret multiple results in order to provide you with the best plan of care or course of treatment. Therefore, we ask that you please give u[place]2 business days to thoroughly review all your results before contacting the office for clarification. Should we see a critical lab result, you will be contacted sooner.   If You Need Anything After Your Visit  If you have any questions or concerns for your doctor, please call our main line at [phone] press option 4 to reach your doctor's medical assistant. If no one answers, please leave a voicemail as directed and we will return your call as soon as possible. Messages left after 4 pm will be answered the following business day.   You may also send [place] message via [facility] We typically respond to MyChart messages within 1-2 business days.  For prescription refills, please ask your pharmacy to contact our office. Our fax number is [phone]  If you have an urgent issue when the clinic is closed that cannot wait until the next business day, you can page your doctor at the number below.    Please note  that while we do our best to be available for urgent issues outside of office hours, we are not available 24/7.   If you have an urgent issue and are unable to reach [place], you may choose to seek medical care at your doctor's office, retail clinic, urgent care center, or emergency room.  If you have a medical emergency, please immediately call 911 or go to the emergency department.  Pager Numbers  - Dr. Nehemiah Massed: [phone]  - Dr. Laurence Ferrari: [phone]  - Dr. Nicole Kindred: [phone]  In the event of inclement weather, please call our main line at  [phone] for an update on the status of any delays or closures.  Dermatology Medication Tips: Please keep the boxes that topical medications come in in order to help keep track of the instructions about where and how to use these. Pharmacies typically print the medication instructions only on the boxes and not directly on the medication tubes.   If your medication is too expensive, please contact our office at [phone] option 4 or send [place] a message through [place].   We are unable to tell what your co-pay for medications will be in advance as this is different depending on your insurance coverage. However, we may be able to find a substitute medication at lower cost or fill out paperwork to get insurance to cover a needed medication.   If a prior authorization is required to get your medication covered by your insurance company, please allow [place] 1-2 business days to complete this process.  Drug prices often vary depending on where the prescription is filled and some pharmacies may offer cheaper prices.  The website www.goodrx.com contains coupons for medications through different pharmacies. The prices here do not account for what the cost may be with help from insurance (it may be cheaper with your insurance), but the website can give you the price if you did not use any insurance.  - You can print the associated coupon and take it with your prescription to the pharmacy.  - You may also stop by our office during regular business hours and pick up a GoodRx coupon card.  - If you need your prescription sent electronically to a different pharmacy, notify our office through [facility] or by phone at [phone] option 4.     Si Usted Necesita Algo Despus de Su Visita  Tambin puede enviarnos un mensaje a travs de Pharmacist, community. Por lo general respondemos a los mensajes de MyChart en el transcurso de 1 a 2 das hbiles.  Para renovar recetas, por favor pida a su farmacia que se  ponga en contacto con nuestra oficina. Harland Dingwall de fax es Industry [phone].  Si tiene un asunto urgente cuando la clnica est cerrada y que no puede esperar hasta el siguiente da hbil, puede llamar/localizar a su doctor(a) al nmero que aparece a continuacin.   Por favor, tenga en cuenta que aunque hacemos todo lo posible para estar disponibles para asuntos urgentes fuera del horario de [place], no estamos disponibles las 24 horas del da, los 7 das de la [place].   Si tiene un problema urgente y no puede comunicarse con nosotros, puede optar por buscar atencin mdica  en el consultorio de su doctor(a), en una clnica privada, en un centro de atencin urgente o en una sala de emergencias.  Si tiene Engineering geologist, por favor llame inmediatamente al 911 o vaya a la sala de emergencias.  Nmeros de bper  - Dr. Nehemiah Massed: [phone]  -  DraLaurence Ferrari: [phone]  - Dra. Nicole Kindred: [phone]  En caso de inclemencias del [place], por favor llame a Johnsie Kindred principal al [phone] para una actualizacin sobre el [place] de cualquier retraso o cierre.  Consejos para la medicacin en dermatologa: Por favor, guarde las cajas en las que vienen los medicamentos de uso tpico para ayudarle a seguir las instrucciones sobre dnde y cmo usarlos. Las farmacias generalmente imprimen las instrucciones del medicamento slo en las cajas y no directamente en los tubos del [place].   Si su medicamento es muy caro, por favor, pngase en contacto con Zigmund Daniel llamando al [phone] y presione la opcin 4 o envenos un mensaje a travs de Pharmacist, community.   No podemos decirle cul ser su copago por los medicamentos por adelantado ya que esto es diferente dependiendo de la cobertura de su seguro. Sin embargo, es posible que podamos encontrar un medicamento sustituto a Electrical engineer un formulario para que el seguro cubra el medicamento que se considera necesario.   Si se requiere  una autorizacin previa para que su compaa de seguros [place] su medicamento, por favor permtanos de 1 a 2 das hbiles para completar este proceso.  Los precios de los medicamentos varan con frecuencia dependiendo del Environmental consultant de dnde se surte la receta y alguna farmacias pueden ofrecer precios ms baratos.  El sitio web www.goodrx.com tiene cupones para medicamentos de Airline pilot. Los precios aqu no tienen en cuenta lo que podra costar con la ayuda del seguro (puede ser ms barato con su seguro), pero el sitio web puede darle el precio si no utiliz Research scientist (physical sciences).  - Puede imprimir el cupn correspondiente y llevarlo con su receta a la farmacia.  - Tambin puede pasar por nuestra oficina durante el horario de atencin regular y Charity fundraiser una tarjeta de cupones de GoodRx.  - Si necesita que su receta se enve electrnicamente a una farmacia diferente, informe a nuestra oficina a travs de MyChart de [place] o por telfono llamando al [phone] y presione la opcin 4.

## 2022-10-02 NOTE — Telephone Encounter (Signed)
Yes, please set up OV when possible.  Thanks.

## 2022-10-03 NOTE — Telephone Encounter (Signed)
Spoke to pt, scheduled ov for 10/08/22

## 2022-10-08 ENCOUNTER — Ambulatory Visit (INDEPENDENT_AMBULATORY_CARE_PROVIDER_SITE_OTHER): Payer: Medicare Other | Admitting: Family Medicine

## 2022-10-08 ENCOUNTER — Encounter: Payer: Self-pay | Admitting: Family Medicine

## 2022-10-08 VITALS — BP 120/80 | HR 61 | Temp 97.2°F | Ht 59.0 in | Wt 214.0 lb

## 2022-10-08 DIAGNOSIS — G2581 Restless legs syndrome: Secondary | ICD-10-CM | POA: Diagnosis not present

## 2022-10-08 DIAGNOSIS — R1310 Dysphagia, unspecified: Secondary | ICD-10-CM

## 2022-10-08 DIAGNOSIS — E042 Nontoxic multinodular goiter: Secondary | ICD-10-CM | POA: Diagnosis not present

## 2022-10-08 DIAGNOSIS — I10 Essential (primary) hypertension: Secondary | ICD-10-CM

## 2022-10-08 DIAGNOSIS — L659 Nonscarring hair loss, unspecified: Secondary | ICD-10-CM

## 2022-10-08 DIAGNOSIS — Z7189 Other specified counseling: Secondary | ICD-10-CM

## 2022-10-08 NOTE — Patient Instructions (Signed)
Please have the dermatology clinic send me a note.  I'll await the notes from GI and cardiology.  Let me see about options for RLS.  Take care.  Glad to see you. Happy thanksgiving.

## 2022-10-08 NOTE — Progress Notes (Unsigned)
Thyroid nodule d/w pt.  IMPRESSION: 1. Approximately 2.7 cm TI-RADS category 4 nodule in the right mid gland meets criteria to consider fine-needle aspiration biopsy. 2. Approximally 1.7 cm TI-RADS category 4 nodule with extensive dystrophic calcifications in the left mid gland also meets criteria to consider fine-needle aspiration biopsy.  She is going to see Dr. Dr Alice Reichert next month.  She has dysphagia to pills.  She has a persistent "tickle" in the throat.  She has a recurrent cough.  Discussed rationale for GI follow-up, in case GERD was contributing to her throat symptoms.  RLS.  Prev tried Requip 0.'5mg'$ .  It helps with legs but it makes her dizzy.  Lower dose didn't help with sx.  Going on for ~5 years.    Hypertension:    Using medication without problems or lightheadedness: yes Chest pain with exertion:no Edema:no Short of breath:no She is going to f/u with cardiology about tachycardia.   Discussed taking potassium.  She had trouble swallowing pill.    She has suture site on the scalp, per derm, with suture removal scheduled for next week.    Recent path report d/w pt.  She'll f/u with derm clinic.    1. Skin , right back right of midline DYSPLASTIC COMPOUND NEVUS WITH MODERATE ATYPIA, LIMITED MARGINS FREE 2. 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  Husband designated if patient were incapacitated.   Meds, vitals, and allergies reviewed.   ROS: Per HPI unless specifically indicated in ROS section   GEN: nad, alert and oriented, alopecia noted with healing suture site on the scalp. HEENT: ncat NECK: supple w/o LA CV: rrr.  PULM: ctab, no inc wob ABD: soft, +bs EXT: no edema SKIN: no acute rash  35 minutes were devoted to patient care in this encounter (this includes time spent reviewing the patient's file/history, interviewing and examining the patient,  counseling/reviewing plan with patient).

## 2022-10-09 ENCOUNTER — Telehealth: Payer: Self-pay

## 2022-10-09 ENCOUNTER — Telehealth: Payer: Self-pay | Admitting: Family Medicine

## 2022-10-09 DIAGNOSIS — L659 Nonscarring hair loss, unspecified: Secondary | ICD-10-CM | POA: Insufficient documentation

## 2022-10-09 DIAGNOSIS — Z7189 Other specified counseling: Secondary | ICD-10-CM | POA: Insufficient documentation

## 2022-10-09 MED ORDER — GABAPENTIN 100 MG PO CAPS: 100.0000 mg | ORAL_CAPSULE | Freq: Every day | ORAL | 3 refills | Status: DC

## 2022-10-09 NOTE — Assessment & Plan Note (Signed)
Per dermatology, await follow-up report.

## 2022-10-09 NOTE — Assessment & Plan Note (Signed)
Continue amlodipine and atenolol triamterene hydrochlorothiazide.

## 2022-10-09 NOTE — Assessment & Plan Note (Signed)
Discussed rationale for follow-up with GI as it could contribute to the "tickle" in her throat and dysphagia.  I will await GI follow-up.

## 2022-10-09 NOTE — Assessment & Plan Note (Signed)
She was not able to tolerate Requip.  See following phone note.  I wanted to consider options in the meantime.  Options discussed in general with patient.

## 2022-10-09 NOTE — Telephone Encounter (Signed)
Please update patient.  I sent a prescription for gabapentin, to try for restless leg symptoms.  Would start with 100 mg at night.  She can gradually increase to 300 mg if tolerated/needed.  It can occasionally cause vertigo symptoms but this would be less likely at a lower dose.  Let me know if this does not help or is not tolerated.  Thanks.

## 2022-10-09 NOTE — Telephone Encounter (Incomplete Revision)
Discussed results with patient. She verbalized understanding and denies further questions. Patient states her primary would like information sent to him regarding bx results. Patient states she would discuss further at next follow up.   Patient instructed to continue using clobetasol solution to scalp twice daily for itch avoiding the bx site.  Avoid face, folds, and groin.  Topical steroids (such as triamcinolone, fluocinolone, fluocinonide, mometasone, clobetasol, halobetasol, betamethasone, hydrocortisone) can cause thinning and lightening of the skin if they are used for too long in the same area. Your physician has selected the right strength medicine for your problem and area affected on the body. Please use your medication only as directed by your physician to prevent side effects.      ----- Message from Alfonso Patten, MD sent at 10/09/2022  8:50 AM EST ----- 1. Skin , right back right of midline DYSPLASTIC COMPOUND NEVUS WITH MODERATE ATYPIA, LIMITED MARGINS FREE --> recheck at follow-up  This is a MODERATELY ATYPICAL MOLE. On the spectrum from normal mole to melanoma skin cancer, this is in between the two. - We need to recheck this area sometime in the next 6 months to be sure there is no evidence of the atypical mole coming back. If there is any color coming back, we would recommend repeating the biopsy to be sure the cells look normal.  - People who have a history of atypical moles do have a slightly increased risk of developing melanoma somewhere on the body, so a yearly full body skin exam by a dermatologist is recommended.  - Monthly self skin checks and daily sun protection are also recommended.  - Please call if you notice a dark spot coming back where this biopsy was taken.  - Please also call if you notice any new or changing spots anywhere else on the body before your follow-up visit.   2. 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 No fungal infection seen.  -- Start clobetasol solution twice a day to scalp (avoiding biopsy site) until follow-up -- Will discuss other treatment options at suture removal/follow-up  MAs please call. Thank you!

## 2022-10-09 NOTE — Telephone Encounter (Addendum)
Discussed results with patient. She verbalized understanding and denies further questions. Patient states her primary would like information sent to him regarding bx results. Patient states she would discuss further at next follow up.   Patient instructed to continue using clobetasol solution to scalp twice daily for itch avoiding the bx site.  Avoid face, folds, and groin.  Topical steroids (such as triamcinolone, fluocinolone, fluocinonide, mometasone, clobetasol, halobetasol, betamethasone, hydrocortisone) can cause thinning and lightening of the skin if they are used for too long in the same area. Your physician has selected the right strength medicine for your problem and area affected on the body. Please use your medication only as directed by your physician to prevent side effects.      ----- Message from Alfonso Patten, MD sent at 10/09/2022  8:50 AM EST ----- 1. Skin , right back right of midline DYSPLASTIC COMPOUND NEVUS WITH MODERATE ATYPIA, LIMITED MARGINS FREE --> recheck at follow-up  This is a MODERATELY ATYPICAL MOLE. On the spectrum from normal mole to melanoma skin cancer, this is in between the two. - We need to recheck this area sometime in the next 6 months to be sure there is no evidence of the atypical mole coming back. If there is any color coming back, we would recommend repeating the biopsy to be sure the cells look normal.  - People who have a history of atypical moles do have a slightly increased risk of developing melanoma somewhere on the body, so a yearly full body skin exam by a dermatologist is recommended.  - Monthly self skin checks and daily sun protection are also recommended.  - Please call if you notice a dark spot coming back where this biopsy was taken.  - Please also call if you notice any new or changing spots anywhere else on the body before your follow-up visit.   2. 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 No fungal infection seen.  -- Start clobetasol solution twice a day to scalp (avoiding biopsy site) until follow-up -- Will discuss other treatment options at suture removal/follow-up  MAs please call. Thank you!

## 2022-10-09 NOTE — Assessment & Plan Note (Signed)
Discussed options.  She is not enthused about having a thyroid biopsy.  If she isn't going to have thyroid biopsy, then it would be reasonable to f/u with Dr. Buddy Duty.

## 2022-10-09 NOTE — Assessment & Plan Note (Signed)
Advance directive discussed with patient.  Husband designated if patient were incapacitated. 

## 2022-10-14 NOTE — Telephone Encounter (Signed)
Patient notified. She will let us know how she is tolerating it.

## 2022-10-15 ENCOUNTER — Ambulatory Visit
Admission: EM | Admit: 2022-10-15 | Discharge: 2022-10-15 | Disposition: A | Discharge status: Home / Self Care | Payer: Medicare Other | Attending: Emergency Medicine | Admitting: Emergency Medicine

## 2022-10-15 ENCOUNTER — Telehealth: Payer: Self-pay

## 2022-10-15 ENCOUNTER — Encounter: Payer: Medicare Other | Admitting: Family Medicine

## 2022-10-15 DIAGNOSIS — U071 COVID-19: Secondary | ICD-10-CM | POA: Diagnosis not present

## 2022-10-15 DIAGNOSIS — R509 Fever, unspecified: Secondary | ICD-10-CM

## 2022-10-15 MED ORDER — MOLNUPIRAVIR EUA 200MG CAPSULE: 4.0000 | ORAL_CAPSULE | Freq: Two times a day (BID) | ORAL | 0 refills | Status: AC

## 2022-10-15 MED ORDER — ACETAMINOPHEN 325 MG PO TABS
650.0000 mg | ORAL_TABLET | Freq: Once | ORAL | Status: AC
  Administered 2022-10-15: 650 mg via ORAL

## 2022-10-15 NOTE — Telephone Encounter (Signed)
I spoke with pt; pt said starting on 10/14/22 pt began with head and chest congestion and had wheezing last night; pt has dry cough; now pts fever is 99.no CP or SOB. Pt said last time had covid was difficult and pt wants to know since  + covid should pt  get paxlovid. Pt said she is in no distress with breathing now. No H/A. No available appts at [facility] or [facility]. Pt said she is not in need of ED now and pt is going to [place] for eval.  Sending note to Dr Damita Dunnings and Damita Dunnings pool.     [place] Night - Client Nonclinical Telephone Record  [facility] Client [facility] Night - Client Client Site [facility] - Night Provider Renford Dills - MD Contact Type Call Who Is Calling Patient / Member / Family / Caregiver Caller Name Declined to provide Caller Phone Number [phone] Patient Name Jennifer [name] Patient DOB Dec 19, 1950 Call Type Message Only Information Provided Reason for Call Request to Speak to a Physician Initial Comment Caller states that she would like to relay a message to Dr. Damita Dunnings that she is positive for COVID this morning. Her husband had it first but now she is positive. This is the second time she has had it. The first time was difficult for her. She didn't know if Dr. Damita Dunnings wanted to prescribe Paxlovid or not. Not breathing well, temperature all night long, bad congestion, cough and she isn't sure what to do or take for this. Additional Comment Caller declined triage and states that if he wants her to have the prescription she would like it sent to [facility] in [place]. She trusts whatever Dr. Damita Dunnings wants her to do. Disp. Time Disposition Final User 10/15/2022 7:52:25 AM General Information Provided Yes Wynema Birch Call Closed By: Wynema Birch Transaction Date/Time: 10/15/2022 7:48:07 AM (ET

## 2022-10-15 NOTE — Telephone Encounter (Signed)
Agreed and thanks

## 2022-10-15 NOTE — Discharge Instructions (Addendum)
Take the molnupiravir as directed.  Take Tylenol as needed for fever or discomfort.  Follow-up with your primary care provider.    Go to the emergency department if you have shortness of breath or other concerning symptoms.

## 2022-10-15 NOTE — ED Triage Notes (Signed)
Pt had a positive COVID test at home. Pt c/o headache, cough and nasal congestion. Pt also reports some dizziness.
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
## 2022-10-16 ENCOUNTER — Ambulatory Visit: Payer: Medicare Other | Admitting: Dermatology

## 2022-10-23 ENCOUNTER — Encounter: Payer: Self-pay | Admitting: Dermatology

## 2022-10-23 ENCOUNTER — Ambulatory Visit (INDEPENDENT_AMBULATORY_CARE_PROVIDER_SITE_OTHER): Payer: Medicare Other | Admitting: Dermatology

## 2022-10-23 DIAGNOSIS — L280 Lichen simplex chronicus: Secondary | ICD-10-CM

## 2022-10-23 DIAGNOSIS — D235 Other benign neoplasm of skin of trunk: Secondary | ICD-10-CM | POA: Diagnosis not present

## 2022-10-23 DIAGNOSIS — L988 Other specified disorders of the skin and subcutaneous tissue: Secondary | ICD-10-CM | POA: Diagnosis not present

## 2022-10-23 DIAGNOSIS — L649 Androgenic alopecia, unspecified: Secondary | ICD-10-CM | POA: Diagnosis not present

## 2022-10-23 DIAGNOSIS — D239 Other benign neoplasm of skin, unspecified: Secondary | ICD-10-CM

## 2022-10-23 MED ORDER — CLOBETASOL PROPIONATE 0.05 % EX SOLN: 1.0000 | Freq: Two times a day (BID) | CUTANEOUS | 2 refills | Status: DC

## 2022-10-23 MED ORDER — DUTASTERIDE 0.5 MG PO CAPS: 0.5000 mg | ORAL_CAPSULE | Freq: Every day | ORAL | 2 refills | Status: DC

## 2022-10-23 NOTE — Progress Notes (Signed)
Follow-Up Visit   Subjective  Jennifer [name] is a 71 y.o. female who presents for the following: Suture / Staple Removal and Procedure (Patient here today for shave removal of bx proven dysplastic nevus with moderate to severe atypia at low back right of midline. ).  The following portions of the chart were reviewed this encounter and updated as appropriate:   Tobacco  Allergies  Meds  Problems  Med Hx  Surg Hx  Fam Hx      Review of Systems:  No other skin or systemic complaints except as noted in HPI or Assessment and Plan.  Objective  Well appearing patient in no apparent distress; mood and affect are within normal limits.  A focused examination was performed including scalp, back. Relevant physical exam findings are noted in the Assessment and Plan.  low back right of midline Pink bx site  Scalp Diffuse thinning of the crown and widening of the midline part with retention of the frontal hairline - Reviewed progressive nature and prognosis.     Assessment & Plan  Dysplastic nevus low back right of midline  [phone]  Pt prefers shave removal to excision  Epidermal / dermal shaving - low back right of midline  Lesion diameter (cm):  0.9 Informed consent: discussed and consent obtained   Timeout: patient name, date of birth, surgical site, and procedure verified   Anesthesia: the lesion was anesthetized in a standard fashion   Anesthetic:  1% lidocaine w/ epinephrine 1-100,000 local infiltration Instrument used: flexible razor blade   Hemostasis achieved with: aluminum chloride   Outcome: patient tolerated procedure well   Post-procedure details: wound care instructions given   Additional details:  Mupirocin and a bandage applied  Specimen 1 - Surgical pathology Differential Diagnosis: Bx proven DYSPLASTIC NEVUS WITH MODERATE TO SEVERE ATYPIA  Check Margins: yes Pink bx site [id number]  Lichen simplex chronicus Scalp  Biopsy proven at  scalp  Start clobetasol solution twice a day to itchy areas. Avoid applying to face, groin, and axilla. Use as directed. Long-term use can cause thinning of the skin.  Topical steroids (such as triamcinolone, fluocinolone, fluocinonide, mometasone, clobetasol, halobetasol, betamethasone, hydrocortisone) can cause thinning and lightening of the skin if they are used for too long in the same area. Your physician has selected the right strength medicine for your problem and area affected on the body. Please use your medication only as directed by your physician to prevent side effects.    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 Condition is symptomatic/ bothersome to patient. Not currently at goal.  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  It is chronic and persistent, but treatable; not curable. Topical treatment includes: - 5% topical Minoxidil Oral treatment includes: - Finasteride 1 mg qd - Minoxidil 1.25 - 5 mg qd - Dutasteride 0.5 mg qd Adjunct therapy includes: - Low Level Laser Light Therapy (LLLT) - Platelet-rich Plasma injections (PRP) - Hair Transplantation or scalp reduction  Start dutasteride 0.'5mg'$  once daily. Pt defers minoxidil (topical and oral)  dutasteride (AVODART) 0.5 MG capsule - Scalp Take 1 capsule (0.5 mg total) by mouth daily.  clobetasol (TEMOVATE) 0.05 % external solution - Scalp Apply 1 Application  topically 2 (two) times daily.  Encounter for Removal of Sutures - Incision site at the scalp is clean, dry and intact - Wound cleansed, sutures removed, wound cleansed and steri strips  applied.  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 - Scars remodel for a full year. - Once steri-strips fall off, patient can apply over-the-counter silicone scar cream each night to help with scar remodeling if desired. - Patient advised to call with any concerns or if they notice any new or changing lesions.  Return in about 3 months (around 01/22/2023) for Alopecia.  Graciella Belton, RMA, am acting as scribe for Forest Gleason, MD .  Documentation: I have reviewed the above documentation for accuracy and completeness, and I agree with the above.  Forest Gleason, MD

## 2022-10-23 NOTE — Patient Instructions (Signed)
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  It is chronic and persistent, but treatable; not curable. Topical treatment includes: - 5% topical Minoxidil Oral treatment includes: - Finasteride 1 mg qd - Minoxidil 1.25 - 5 mg qd - Dutasteride 0.5 mg qd Adjunct therapy includes: - Low Level Laser Light Therapy (LLLT) - Platelet-rich Plasma injections (PRP) - Hair Transplantation or scalp reduction  Start clobetasol solution twice daily as needed for itch. Avoid applying to face, groin, and axilla. Use as directed. Long-term use can cause thinning of the skin. Start dutasteride 0.'5mg'$  once daily.  Topical steroids (such as triamcinolone, fluocinolone, fluocinonide, mometasone, clobetasol, halobetasol, betamethasone, hydrocortisone) can cause thinning and lightening of the skin if they are used for too long in the same area. Your physician has selected the right strength medicine for your problem and area affected on the body. Please use your medication only as directed by your physician to prevent side effects.   Wound Care Instructions  Cleanse wound gently with soap and water once a day then pat dry with clean gauze. Apply a thin coat of Petrolatum (petroleum jelly, "Vaseline") over the wound (unless you have an allergy to this). We recommend that you use a new, sterile tube of Vaseline. Do not pick or remove scabs. Do not remove the yellow or white "healing tissue" from the base of the wound.  Cover the wound with fresh, clean, nonstick gauze and secure with paper tape. You may use Band-Aids in place of gauze and tape if the wound is small enough, but would recommend trimming much of the tape off as there is often too much. Sometimes Band-Aids can irritate the skin.  You should call the office for your  biopsy report after 1 week if you have not already been contacted.  If you experience any problems, such as abnormal amounts of bleeding, swelling, significant bruising, significant pain, or evidence of infection, please call the office immediately.  FOR ADULT SURGERY PATIENTS: If you need something for pain relief you may take 1 extra strength Tylenol (acetaminophen) AND 2 Ibuprofen ('200mg'$  each) together every 4 hours as needed for pain. (do not take these if you are allergic to them or if you have a reason you should not take them.) Typically, you may only need pain medication for 1 to 3 days.   Due to recent changes in healthcare laws, you may see results of your pathology and/or laboratory studies on MyChart before the doctors have had a chance to review them. We understand that in some cases there may be results that are confusing or concerning to you. Please understand that not all results are received at the same time and often the doctors may need to interpret multiple results in order to provide you with the best plan of care or course of treatment. Therefore, we ask that you please give [place] 2 business days to thoroughly review all your results before contacting the office for clarification. Should we see a critical lab result, you will be contacted sooner.   If You Need Anything After Your Visit  If you have any questions or concerns for your doctor, please call our main line at [phone] and press option 4 to reach your doctor's medical assistant. If no one answers, please leave a voicemail as directed and we will return your call as soon as possible.  Messages left after 4 pm will be answered the following business day.   You may also send [place] a message via [place]. We typically respond to MyChart messages within 1-2 business days.  For prescription refills, please ask your pharmacy to contact our office. Our fax number is [phone].  If you have an urgent issue when the clinic is  closed that cannot wait until the next business day, you can page your doctor at the number below.    Please note that while we do our best to be available for urgent issues outside of office hours, we are not available 24/7.   If you have an urgent issue and are unable to reach [place], you may choose to seek medical care at your doctor's office, retail clinic, urgent care center, or emergency room.  If you have a medical emergency, please immediately call 911 or go to the emergency department.  Pager Numbers  - Dr. Nehemiah Massed: [phone]  - Dr. Laurence Ferrari: [phone]  - Dr. Nicole Kindred: [phone]  In the event of inclement weather, please call our main line at [phone] for an update on the status of any delays or closures.  Dermatology Medication Tips: Please keep the boxes that topical medications come in in order to help keep track of the instructions about where and how to use these. Pharmacies typically print the medication instructions only on the boxes and not directly on the medication tubes.   If your medication is too expensive, please contact our office at [phone] option 4 or send [place] a message through [place].   We are unable to tell what your co-pay for medications will be in advance as this is different depending on your insurance coverage. However, we may be able to find a substitute medication at lower cost or fill out paperwork to get insurance to cover a needed medication.   If a prior authorization is required to get your medication covered by your insurance company, please allow [place] 1-2 business days to complete this process.  Drug prices often vary depending on where the prescription is filled and some pharmacies may offer cheaper prices.  The website www.goodrx.com contains coupons for medications through different pharmacies. The prices here do not account for what the cost may be with help from insurance (it may be cheaper with your insurance), but the website can  give you the price if you did not use any insurance.  - You can print the associated coupon and take it with your prescription to the pharmacy.  - You may also stop by our office during regular business hours and pick up a GoodRx coupon card.  - If you need your prescription sent electronically to a different pharmacy, notify our office through [facility] or by phone at [phone] option 4.     Si Usted Necesita Algo Despus de Su Visita  Tambin puede enviarnos un mensaje a travs de Pharmacist, community. Por lo general respondemos a los mensajes de MyChart en el transcurso de 1 a 2 das hbiles.  Para renovar recetas, por favor pida a su farmacia que se ponga en contacto con nuestra oficina. Harland Dingwall de fax es [place] [phone].  Si tiene un asunto urgente cuando la clnica est cerrada y que no puede esperar hasta el siguiente da hbil, puede llamar/localizar a su doctor(a) al nmero que aparece a continuacin.   Por favor, tenga en cuenta que aunque hacemos todo lo posible para estar disponibles para asuntos urgentes fuera del horario de [place], no  estamos disponibles las 24 horas del da, los 7 das de la [place].   Si tiene un problema urgente y no puede comunicarse con nosotros, puede optar por buscar atencin mdica  en el consultorio de su doctor(a), en una clnica privada, en un centro de atencin urgente o en una sala de emergencias.  Si tiene Engineering geologist, por favor llame inmediatamente al 911 o vaya a la sala de emergencias.  Nmeros de bper  - Dr. Nehemiah Massed: [phone]  - Dra. Moye: [phone]  - Dra. Nicole Kindred: [phone]  En caso de inclemencias del [place], por favor llame a Johnsie Kindred principal al [phone] para una actualizacin sobre el [place] de cualquier retraso o cierre.  Consejos para la medicacin en dermatologa: Por favor, guarde las cajas en las que vienen los medicamentos de uso tpico para ayudarle a seguir las instrucciones sobre  dnde y cmo usarlos. Las farmacias generalmente imprimen las instrucciones del medicamento slo en las cajas y no directamente en los tubos del [place].   Si su medicamento es muy caro, por favor, pngase en contacto con Zigmund Daniel llamando al [phone] y presione la opcin 4 o envenos un mensaje a travs de Pharmacist, community.   No podemos decirle cul ser su copago por los medicamentos por adelantado ya que esto es diferente dependiendo de la cobertura de su seguro. Sin embargo, es posible que podamos encontrar un medicamento sustituto a Electrical engineer un formulario para que el seguro cubra el medicamento que se considera necesario.   Si se requiere una autorizacin previa para que su compaa de seguros [place] su medicamento, por favor permtanos de 1 a 2 das hbiles para completar este proceso.  Los precios de los medicamentos varan con frecuencia dependiendo del Environmental consultant de dnde se surte la receta y alguna farmacias pueden ofrecer precios ms baratos.  El sitio web www.goodrx.com tiene cupones para medicamentos de Airline pilot. Los precios aqu no tienen en cuenta lo que podra costar con la ayuda del seguro (puede ser ms barato con su seguro), pero el sitio web puede darle el precio si no utiliz Research scientist (physical sciences).  - Puede imprimir el cupn correspondiente y llevarlo con su receta a la farmacia.  - Tambin puede pasar por nuestra oficina durante el horario de atencin regular y Charity fundraiser una tarjeta de cupones de GoodRx.  - Si necesita que su receta se enve electrnicamente a una farmacia diferente, informe a nuestra oficina a travs de MyChart de [place] o por telfono llamando al [phone] y presione la opcin 4.

## 2022-10-31 ENCOUNTER — Telehealth: Payer: Self-pay

## 2022-10-31 NOTE — Telephone Encounter (Signed)
-----   Message from [place], MD sent at 10/31/2022 10:54 AM EST ----- Skin , low back right of midline NO RESIDUAL DYSPLASTIC NEVUS, MARGINS FREE   Entire lesion appears to be out. No additional treatment needed at this time. Please call our office [phone] with any questions.   MAs please call. Thank you!

## 2022-10-31 NOTE — Telephone Encounter (Signed)
Patient wanted me to let you know she is not going to start dutasteride due to side effects. Her children got her a wig and she seemed very excited about it. Jennifer [name]., RMA

## 2022-10-31 NOTE — Telephone Encounter (Signed)
Okay, perfect. Thank you!

## 2022-10-31 NOTE — Telephone Encounter (Signed)
Patient advised pathology showed margins clear, no further treatment at this time, JS

## 2022-11-13 ENCOUNTER — Other Ambulatory Visit: Payer: Self-pay | Admitting: Nurse Practitioner

## 2022-11-13 DIAGNOSIS — R053 Chronic cough: Secondary | ICD-10-CM | POA: Diagnosis not present

## 2022-11-13 DIAGNOSIS — R0989 Other specified symptoms and signs involving the circulatory and respiratory systems: Secondary | ICD-10-CM

## 2022-11-13 DIAGNOSIS — Z6841 Body Mass Index (BMI) 40.0 and over, adult: Secondary | ICD-10-CM | POA: Diagnosis not present
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
## 2022-11-19 ENCOUNTER — Encounter: Payer: Self-pay | Admitting: Cardiovascular Disease

## 2022-11-19 ENCOUNTER — Ambulatory Visit: Payer: Medicare Other | Attending: Cardiovascular Disease | Admitting: Cardiovascular Disease

## 2022-11-19 VITALS — BP 140/80 | HR 71 | Ht 60.0 in | Wt 217.1 lb

## 2022-11-19 DIAGNOSIS — R002 Palpitations: Secondary | ICD-10-CM | POA: Diagnosis not present

## 2022-11-19 DIAGNOSIS — R011 Cardiac murmur, unspecified: Secondary | ICD-10-CM | POA: Insufficient documentation

## 2022-11-19 NOTE — Patient Instructions (Signed)
Medication Instructions:  No changes  If you need a refill on your cardiac medications before your next appointment, please call your pharmacy.   Lab work: No new labs needed  Testing/Procedures: No new testing needed  Follow-Up: At [facility], you and your health needs are our priority.  As part of our continuing mission to provide you with exceptional heart care, we have created designated Provider Care Teams.  These Care Teams include your primary Cardiologist (physician) and Advanced Practice Providers (APPs -  Physician Assistants and Nurse Practitioners) who all work together to provide you with the care you need, when you need it.  You will need a follow up appointment in 12 months  Providers on your designated Care Team:   Christopher Berge, NP Ryan Dunn, PA-C Cadence Furth, PA-C  COVID-19 Vaccine Information can be found at: [email or web address] For questions related to vaccine distribution or appointments, please email [email or web address] or call [phone].   

## 2022-11-22 ENCOUNTER — Other Ambulatory Visit: Payer: Medicare Other

## 2022-11-29 ENCOUNTER — Ambulatory Visit
Admission: RE | Admit: 2022-11-29 | Discharge: 2022-11-29 | Disposition: A | Discharge status: Home / Self Care | Payer: Medicare Other | Source: Ambulatory Visit | Attending: Nurse Practitioner | Admitting: Nurse Practitioner

## 2022-11-29 DIAGNOSIS — K219 Gastro-esophageal reflux disease without esophagitis: Secondary | ICD-10-CM | POA: Diagnosis not present

## 2022-11-29 DIAGNOSIS — R0989 Other specified symptoms and signs involving the circulatory and respiratory systems: Secondary | ICD-10-CM | POA: Insufficient documentation

## 2022-11-29 DIAGNOSIS — R053 Chronic cough: Secondary | ICD-10-CM | POA: Insufficient documentation

## 2022-11-29 DIAGNOSIS — R1310 Dysphagia, unspecified: Secondary | ICD-10-CM | POA: Diagnosis not present

## 2022-11-29 DIAGNOSIS — K449 Diaphragmatic hernia without obstruction or gangrene: Secondary | ICD-10-CM | POA: Diagnosis not present

## 2022-12-03 ENCOUNTER — Telehealth: Payer: Self-pay | Admitting: Family Medicine

## 2022-12-03 DIAGNOSIS — G2581 Restless legs syndrome: Secondary | ICD-10-CM

## 2022-12-03 NOTE — Telephone Encounter (Signed)
Patient called in and stated that she was prescribed some gabapentin (NEURONTIN) 100 MG capsule. She stated that it made it her dizzy and her legs jerked. She stated that she started back taking Ropinirole and it is helping. She was wanting to know if Dr. Damita Dunnings could send this in for her to [facility]. She was also wanting to know what to do with the remaining Gabapentin. Please advise. Thank you!

## 2022-12-04 MED ORDER — ROPINIROLE HCL 0.5 MG PO TABS: 0.5000 mg | ORAL_TABLET | Freq: Every day | ORAL | 1 refills | Status: DC

## 2022-12-04 NOTE — Addendum Note (Signed)
Addended by: Tonia Ghent on: 12/04/2022 04:54 PM   Modules accepted: Orders

## 2022-12-04 NOTE — Telephone Encounter (Signed)
Patient aware rx was sent and advised to let us know if that is not tolerated well.

## 2022-12-04 NOTE — Telephone Encounter (Signed)
Duly noted.  Updated her allergy/intolerance list.  I sent the prescription for Requip.  If that does not work or if it is not tolerated then please let me know.  Thanks.

## 2022-12-17 ENCOUNTER — Telehealth: Payer: Self-pay | Admitting: Family Medicine

## 2022-12-17 DIAGNOSIS — G2581 Restless legs syndrome: Secondary | ICD-10-CM

## 2022-12-17 MED ORDER — ROPINIROLE HCL 0.5 MG PO TABS: 0.5000 mg | ORAL_TABLET | Freq: Every day | ORAL | 0 refills | Status: DC

## 2022-12-17 MED ORDER — ROPINIROLE HCL 0.5 MG PO TABS: 0.5000 mg | ORAL_TABLET | Freq: Every day | ORAL | 1 refills | Status: DC

## 2022-12-17 NOTE — Addendum Note (Signed)
Addended by: Sherrilee Gilles B on: 12/17/2022 03:52 PM   Modules accepted: Orders

## 2022-12-17 NOTE — Telephone Encounter (Signed)
Patient called in stating that she is having an issue receiving medication rOPINIRole (REQUIP) 0.5 MG tablet [facility]. She said that they have sent a notification over to Dr Damita Dunnings but haven't heard back to get this medication,and she is almost out.

## 2022-12-17 NOTE — Telephone Encounter (Signed)
Spoke with patient and she stated that there was issue with delivery for unknown reason. She spoke with them on the 17th and rx was supposed to be delivered on the 26th but never arrived.   [facility] and they could not tell me anything about why the rx was not delivered other then that they already cancelled out her prescription. Nothing was ever sent to [place] that I know of; nothing is in S drive or EMR.   I called patient back and advised what I was told by [facility]. She requested a short term [facility] so she does not run out while this is being handled. [facility] and [facility] a new [facility] day [facility].

## 2022-12-17 NOTE — Telephone Encounter (Signed)
Left message to return call to our office.  

## 2022-12-17 NOTE — Addendum Note (Signed)
Addended by: Sherrilee Gilles B on: 12/17/2022 03:53 PM   Modules accepted: Orders

## 2023-01-22 NOTE — Telephone Encounter (Signed)
Opened in error

## 2023-01-23 ENCOUNTER — Ambulatory Visit: Payer: Medicare Other | Admitting: Dermatology

## 2023-01-24 DIAGNOSIS — Z23 Encounter for immunization: Secondary | ICD-10-CM | POA: Diagnosis not present

## 2023-02-04 ENCOUNTER — Encounter: Payer: Self-pay | Admitting: Dermatology

## 2023-02-04 ENCOUNTER — Ambulatory Visit (INDEPENDENT_AMBULATORY_CARE_PROVIDER_SITE_OTHER): Payer: Medicare Other | Admitting: Dermatology

## 2023-02-04 VITALS — BP 150/74 | HR 59

## 2023-02-04 DIAGNOSIS — L280 Lichen simplex chronicus: Secondary | ICD-10-CM

## 2023-02-04 DIAGNOSIS — L578 Other skin changes due to chronic exposure to nonionizing radiation: Secondary | ICD-10-CM | POA: Diagnosis not present

## 2023-02-04 DIAGNOSIS — L649 Androgenic alopecia, unspecified: Secondary | ICD-10-CM | POA: Diagnosis not present

## 2023-02-04 DIAGNOSIS — L570 Actinic keratosis: Secondary | ICD-10-CM | POA: Diagnosis not present

## 2023-02-04 DIAGNOSIS — L820 Inflamed seborrheic keratosis: Secondary | ICD-10-CM

## 2023-02-04 MED ORDER — FLUOROURACIL 5 % EX SOLN: CUTANEOUS | 1 refills | Status: DC

## 2023-02-04 MED ORDER — TRIAMCINOLONE ACETONIDE 0.1 % EX OINT: TOPICAL_OINTMENT | CUTANEOUS | 0 refills | Status: AC

## 2023-02-04 NOTE — Patient Instructions (Addendum)
Wait until healed at scalp before starting solution  - Start 5-fluorouracil/calcipotriene solution twice a day for 14 days to affected areas including scalp.  Reviewed course of treatment and expected reaction.  Patient advised to expect inflammation and crusting and advised that erosions are possible.  Patient advised to be diligent with sun protection during and after treatment. Counseled to keep medication out of reach of children and pets.  After treatment of Fluorouracil solution  If very inflamed.  Start triamcinolone ointment - apply topically to any irritated areas scalp twice daily as needed. Can use for up to 2 weeks. Avoid applying to face, groin, and axilla. Use as directed. Long-term use can cause thinning of the skin.  Topical steroids (such as triamcinolone, fluocinolone, fluocinonide, mometasone, clobetasol, halobetasol, betamethasone, hydrocortisone) can cause thinning and lightening of the skin if they are used for too long in the same area. Your physician has selected the right strength medicine for your problem and area affected on the body. Please use your medication only as directed by your physician to prevent side effects.     5-Fluorouracil/Calcipotriene Patient Education   Actinic keratoses are the dry, red scaly spots on the skin caused by sun damage. A portion of these spots can turn into skin cancer with time, and treating them can help prevent development of skin cancer.   Treatment of these spots requires removal of the defective skin cells. There are various ways to remove actinic keratoses, including freezing with liquid nitrogen, treatment with creams, or treatment with a blue light procedure in the office.   5-fluorouracil solution is a topical cream used to treat actinic keratoses. It works by interfering with the growth of abnormal fast-growing skin cells, such as actinic keratoses. These cells peel off and are replaced by healthy ones.    5-fluorouracil/calcipotriene is a combination of the 5-fluorouracil cream with a vitamin D analog cream called calcipotriene. The calcipotriene alone does not treat actinic keratoses. However, when it is combined with 5-fluorouracil, it helps the 5-fluorouracil treat the actinic keratoses much faster so that the same results can be achieved with a much shorter treatment time.  INSTRUCTIONS FOR 5-FLUOROURACIL/CALCIPOTRIENE solution:   5-fluorouracil/calcipotriene solution typically only needs to be used for 4-7 days. A thin layer should be applied twice a day to the treatment areas recommended by your physician.   If your physician prescribed you separate tubes of 5-fluourouracil and calcipotriene, apply a thin layer of 5-fluorouracil followed by a thin layer of calcipotriene.   Avoid contact with your eyes, nostrils, and mouth. Do not use 5-fluorouracil/calcipotriene cream on infected or open wounds.   You will develop redness, irritation and some crusting at areas where you have pre-cancer damage/actinic keratoses. IF YOU DEVELOP PAIN, BLEEDING, OR SIGNIFICANT CRUSTING, STOP THE TREATMENT EARLY - you have already gotten a good response and the actinic keratoses should clear up well.  Wash your hands after applying 5-fluorouracil 5% cream on your skin.   A moisturizer or sunscreen with a minimum SPF 30 should be applied each morning.   Once you have finished the treatment, you can apply a thin layer of Vaseline twice a day to irritated areas to soothe and calm the areas more quickly. If you experience significant discomfort, contact your physician.  For some patients it is necessary to repeat the treatment for best results.  SIDE EFFECTS: When using 5-fluorouracil/calcipotriene cream, you may have mild irritation, such as redness, dryness, swelling, or a mild burning sensation. This usually resolves within 2  weeks. The more actinic keratoses you have, the more redness and inflammation you can  expect during treatment. Eye irritation has been reported rarely. If this occurs, please let us know.  If you have any trouble using this cream, please call the office. If you have any other questions about this information, please do not hesitate to ask me before you leave the office.   Seborrheic Keratosis  What causes seborrheic keratoses? Seborrheic keratoses are harmless, common skin growths that first appear during adult life.  As time goes by, more growths appear.  Some people may develop a large number of them.  Seborrheic keratoses appear on both covered and uncovered body parts.  They are not caused by sunlight.  The tendency to develop seborrheic keratoses can be inherited.  They vary in color from skin-colored to gray, brown, or even black.  They can be either smooth or have a rough, warty surface.   Seborrheic keratoses are superficial and look as if they were stuck on the skin.  Under the microscope this type of keratosis looks like layers upon layers of skin.  That is why at times the top layer may seem to fall off, but the rest of the growth remains and re-grows.    Treatment Seborrheic keratoses do not need to be treated, but can easily be removed in the office.  Seborrheic keratoses often cause symptoms when they rub on clothing or jewelry.  Lesions can be in the way of shaving.  If they become inflamed, they can cause itching, soreness, or burning.  Removal of a seborrheic keratosis can be accomplished by freezing, burning, or surgery. If any spot bleeds, scabs, or grows rapidly, please return to have it checked, as these can be an indication of a skin cancer.    Due to recent changes in healthcare laws, you may see results of your pathology and/or laboratory studies on MyChart before the doctors have had a chance to review them. We understand that in some cases there may be results that are confusing or concerning to you. Please understand that not all results are received at the  same time and often the doctors may need to interpret multiple results in order to provide you with the best plan of care or course of treatment. Therefore, we ask that you please give [place] 2 business days to thoroughly review all your results before contacting the office for clarification. Should we see a critical lab result, you will be contacted sooner.   If You Need Anything After Your Visit  If you have any questions or concerns for your doctor, please call our main line at [phone] and press option 4 to reach your doctor's medical assistant. If no one answers, please leave a voicemail as directed and we will return your call as soon as possible. Messages left after 4 pm will be answered the following business day.   You may also send [place] a message via [place]. We typically respond to MyChart messages within 1-2 business days.  For prescription refills, please ask your pharmacy to contact our office. Our fax number is [phone].  If you have an urgent issue when the clinic is closed that cannot wait until the next business day, you can page your doctor at the number below.    Please note that while we do our best to be available for urgent issues outside of office hours, we are not available 24/7.   If you have an urgent issue and are unable to  reach [place], you may choose to seek medical care at your doctor's office, retail clinic, urgent care center, or emergency room.  If you have a medical emergency, please immediately call 911 or go to the emergency department.  Pager Numbers  - Dr. Nehemiah Massed: [phone]  - Dr. Laurence Ferrari: [phone]  - Dr. Nicole Kindred: [phone]  In the event of inclement weather, please call our main line at [phone] for an update on the status of any delays or closures.  Dermatology Medication Tips: Please keep the boxes that topical medications come in in order to help keep track of the instructions about where and how to use these. Pharmacies typically  print the medication instructions only on the boxes and not directly on the medication tubes.   If your medication is too expensive, please contact our office at [phone] option 4 or send [place] a message through [place].   We are unable to tell what your co-pay for medications will be in advance as this is different depending on your insurance coverage. However, we may be able to find a substitute medication at lower cost or fill out paperwork to get insurance to cover a needed medication.   If a prior authorization is required to get your medication covered by your insurance company, please allow [place] 1-2 business days to complete this process.  Drug prices often vary depending on where the prescription is filled and some pharmacies may offer cheaper prices.  The website www.goodrx.com contains coupons for medications through different pharmacies. The prices here do not account for what the cost may be with help from insurance (it may be cheaper with your insurance), but the website can give you the price if you did not use any insurance.  - You can print the associated coupon and take it with your prescription to the pharmacy.  - You may also stop by our office during regular business hours and pick up a GoodRx coupon card.  - If you need your prescription sent electronically to a different pharmacy, notify our office through [facility] or by phone at [phone] option 4.     Si Usted Necesita Algo Despus de Su Visita  Tambin puede enviarnos un mensaje a travs de Pharmacist, community. Por lo general respondemos a los mensajes de MyChart en el transcurso de 1 a 2 das hbiles.  Para renovar recetas, por favor pida a su farmacia que se ponga en contacto con nuestra oficina. Harland Dingwall de fax es [place] [phone].  Si tiene un asunto urgente cuando la clnica est cerrada y que no puede esperar hasta el siguiente da hbil, puede llamar/localizar a su doctor(a) al nmero que aparece a  continuacin.   Por favor, tenga en cuenta que aunque hacemos todo lo posible para estar disponibles para asuntos urgentes fuera del horario de [place], no estamos disponibles las 24 horas del da, los 7 das de la [place].   Si tiene un problema urgente y no puede comunicarse con nosotros, puede optar por buscar atencin mdica  en el consultorio de su doctor(a), en una clnica privada, en un centro de atencin urgente o en una sala de emergencias.  Si tiene Engineering geologist, por favor llame inmediatamente al 911 o vaya a la sala de emergencias.  Nmeros de bper  - Dr. Nehemiah Massed: [phone]  - Dra. Moye: [phone]  - Dra. Nicole Kindred: [phone]  En caso de inclemencias del [place], por favor llame a Johnsie Kindred principal al [phone] para una actualizacin sobre el [place] de cualquier  retraso o cierre.  Consejos para la medicacin en dermatologa: Por favor, guarde las cajas en las que vienen los medicamentos de uso tpico para ayudarle a seguir las instrucciones sobre dnde y cmo usarlos. Las farmacias generalmente imprimen las instrucciones del medicamento slo en las cajas y no directamente en los tubos del [place].   Si su medicamento es muy caro, por favor, pngase en contacto con Zigmund Daniel llamando al [phone] y presione la opcin 4 o envenos un mensaje a travs de Pharmacist, community.   No podemos decirle cul ser su copago por los medicamentos por adelantado ya que esto es diferente dependiendo de la cobertura de su seguro. Sin embargo, es posible que podamos encontrar un medicamento sustituto a Electrical engineer un formulario para que el seguro cubra el medicamento que se considera necesario.   Si se requiere una autorizacin previa para que su compaa de seguros [place] su medicamento, por favor permtanos de 1 a 2 das hbiles para completar este proceso.  Los precios de los medicamentos varan con frecuencia dependiendo del Environmental consultant de dnde se surte la receta  y alguna farmacias pueden ofrecer precios ms baratos.  El sitio web www.goodrx.com tiene cupones para medicamentos de Airline pilot. Los precios aqu no tienen en cuenta lo que podra costar con la ayuda del seguro (puede ser ms barato con su seguro), pero el sitio web puede darle el precio si no utiliz Research scientist (physical sciences).  - Puede imprimir el cupn correspondiente y llevarlo con su receta a la farmacia.  - Tambin puede pasar por nuestra oficina durante el horario de atencin regular y Charity fundraiser una tarjeta de cupones de GoodRx.  - Si necesita que su receta se enve electrnicamente a una farmacia diferente, informe a nuestra oficina a travs de MyChart de [place] o por telfono llamando al [phone] y presione la opcin 4.

## 2023-02-04 NOTE — Progress Notes (Signed)
Follow-Up Visit   Subjective  Jennifer [name] is a 72 y.o. female who presents for the following: Alopecia (Hx of androgenetic alopecia, patient defers starting treatments due to side effects with dutasteride. 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 Very itchy still at top of scalp. Currently using clobetasol solution to affected areas. Not helping much. ), and Other (Patient reports some itchy bothersome areas at face and neck she would like checked and possibly treated. ).  The following portions of the chart were reviewed this encounter and updated as appropriate:  Tobacco  Allergies  Meds  Problems  Med Hx  Surg Hx  Fam Hx      Review of Systems: No other skin or systemic complaints except as noted in HPI or Assessment and Plan.   Objective  Well appearing patient in no apparent distress; mood and affect are within normal limits.  A focused examination was performed including scalp, face, neck. Relevant physical exam findings are noted in the Assessment and Plan.  Scalp Diffuse thinning of the crown and widening of the midline part with retention of the frontal hairline - Reviewed progressive nature and prognosis.    Assessment & Plan   ANDROGENETIC ALOPECIA (FEMALE PATTERN HAIR LOSS) Exam: Diffuse thinning of the crown and widening of the midline part with retention of the frontal hairline  Chronic and persistent condition with duration or expected duration over one year. Condition is bothersome/symptomatic for patient. Currently flared.  Female Androgenic Alopecia is a chronic condition related to genetics and/or hormonal changes.  In women androgenetic alopecia is commonly associated with menopause but may occur any time after puberty.  It causes hair thinning primarily on the crown with widening of the part and temporal hairline recession.  Can use OTC Rogaine (minoxidil) 5% solution/foam as directed.  Oral treatments in female patients who have no contraindication  may include : - Low dose oral minoxidil 1.25 - 5mg  daily - Spironolactone 50 - 100mg  bid - Finasteride 2.5 - 5 mg daily Adjunctive therapies include: - Low Level Laser Light Therapy (LLLT) - Platelet-rich plasma injections (PRP) - Hair Transplants or scalp reduction   Treatment Plan: Pt did not start dutasteride 0.5mg  once daily due to concerns of side effects.  Discussed side effects with patient. Pt opts to start dutasteride. Pt defers minoxidil (topical and oral)  Related Medications dutasteride (AVODART) 0.5 MG capsule Take 1 capsule (0.5 mg total) by mouth daily.  clobetasol (TEMOVATE) 0.05 % external solution Apply 1 Application topically 2 (two) times daily.  INFLAMED SEBORRHEIC KERATOSIS  Symptomatic, irritating, patient would like treated.  Benign-appearing.  Call clinic for new or changing lesions.   Prior to procedure, discussed risks of blister formation, small wound, skin dyspigmentation, or rare scar following treatment. Recommend Vaseline ointment to treated areas while healing.  Destruction Procedure Note Destruction method: cryotherapy   Informed consent: discussed and consent obtained   Lesion destroyed using liquid nitrogen: Yes   Cryotherapy cycles:  2 Outcome: patient tolerated procedure well with no complications   Post-procedure details: wound care instructions given   Locations: 3 at right cheek , 1 at neck, 1 right frontal scalp # of Lesions Treated: 5  ACTINIC DAMAGE with PreCancerous Actinic Keratoses Counseling for Topical Chemotherapy Management: Patient exhibits: - Severe, confluent actinic changes with pre-cancerous actinic keratoses that is secondary to cumulative UV radiation exposure over time - Condition that is severe; chronic, not at goal. - diffuse scaly erythematous macules and papules with underlying dyspigmentation -  Discussed Prescription "Field Treatment" topical Chemotherapy for Severe, Chronic Confluent Actinic Changes with  Pre-Cancerous Actinic Keratoses Field treatment involves treatment of an entire area of skin that has confluent Actinic Changes (Sun/ Ultraviolet light damage) and PreCancerous Actinic Keratoses by method of PhotoDynamic Therapy (PDT) and/or prescription Topical Chemotherapy agents such as 5-fluorouracil, 5-fluorouracil/calcipotriene, and/or imiquimod.  The purpose is to decrease the number of clinically evident and subclinical PreCancerous lesions to prevent progression to development of skin cancer by chemically destroying early precancer changes that may or may not be visible.  It has been shown to reduce the risk of developing skin cancer in the treated area. As a result of treatment, redness, scaling, crusting, and open sores may occur during treatment course. One or more than one of these methods may be used and may have to be used several times to control, suppress and eliminate the PreCancerous changes. Discussed treatment course, expected reaction, and possible side effects. - Recommend daily broad spectrum sunscreen SPF 30+ to sun-exposed areas, reapply every 2 hours as needed.  - Staying in the shade or wearing long sleeves, sun glasses (UVA+UVB protection) and wide brim hats (4-inch brim around the entire circumference of the hat) are also recommended. - Call for new or changing lesions. - Once scalp is healed from cryotherapy, start 5-fluorouracil solution twice a day for 14 days to affected areas including scalp.   Reviewed course of treatment and expected reaction.  Patient advised to expect inflammation and crusting and advised that erosions are possible.  Patient advised to be diligent with sun protection during and after treatment. Counseled to keep medication out of reach of children and pets.  Return for 2 - 3 month ak followup / alopecia follow up.  I, Ruthell Rummage, CMA, am acting as scribe for Forest Gleason, MD.  Documentation: I have reviewed the above documentation for accuracy  and completeness, and I agree with the above.  Forest Gleason, MD

## 2023-02-10 ENCOUNTER — Encounter: Payer: Self-pay | Admitting: Dermatology

## 2023-03-12 ENCOUNTER — Ambulatory Visit: Payer: Medicare Other | Admitting: Orthopaedic Surgery

## 2023-03-17 ENCOUNTER — Telehealth: Payer: Self-pay | Admitting: Family Medicine

## 2023-03-17 DIAGNOSIS — I10 Essential (primary) hypertension: Secondary | ICD-10-CM

## 2023-03-17 MED ORDER — AMLODIPINE BESYLATE 5 MG PO TABS: 5.0000 mg | ORAL_TABLET | Freq: Every day | ORAL | 1 refills | Status: DC

## 2023-03-17 NOTE — Telephone Encounter (Signed)
Prescription Request  03/17/2023  LOV: 10/08/2022  What is the name of the medication or equipment? amLODipine (NORVASC) 5 MG tablet 3 month supply  Have you contacted your pharmacy to request a refill? Yes   Which pharmacy would you like this sent to?  [facility] - [place], [place] - [address] [place] [address] Phone: [phone] Fax: [phone]    Patient notified that their request is being sent to the clinical staff for review and that they should receive a response within 2 business days.   Please advise at Mobile [phone] (mobile)

## 2023-03-17 NOTE — Addendum Note (Signed)
Addended by: Wendie Simmer B on: 03/17/2023 02:28 PM   Modules accepted: Orders

## 2023-03-17 NOTE — Telephone Encounter (Signed)
Erx sent

## 2023-03-24 ENCOUNTER — Encounter: Payer: Self-pay | Admitting: Physician Assistant

## 2023-03-24 ENCOUNTER — Other Ambulatory Visit (INDEPENDENT_AMBULATORY_CARE_PROVIDER_SITE_OTHER): Payer: Medicare Other

## 2023-03-24 ENCOUNTER — Telehealth: Payer: Self-pay | Admitting: Family Medicine

## 2023-03-24 ENCOUNTER — Ambulatory Visit (INDEPENDENT_AMBULATORY_CARE_PROVIDER_SITE_OTHER): Payer: Medicare Other | Admitting: Physician Assistant

## 2023-03-24 DIAGNOSIS — M1711 Unilateral primary osteoarthritis, right knee: Secondary | ICD-10-CM

## 2023-03-24 DIAGNOSIS — M1712 Unilateral primary osteoarthritis, left knee: Secondary | ICD-10-CM

## 2023-03-24 DIAGNOSIS — I10 Essential (primary) hypertension: Secondary | ICD-10-CM

## 2023-03-24 MED ORDER — METHYLPREDNISOLONE ACETATE 40 MG/ML IJ SUSP
40.0000 mg | INTRAMUSCULAR | Status: AC | PRN
  Administered 2023-03-24: 40 mg via INTRA_ARTICULAR

## 2023-03-24 MED ORDER — LIDOCAINE HCL 1 % IJ SOLN
3.0000 mL | INTRAMUSCULAR | Status: AC | PRN
  Administered 2023-03-24: 3 mL

## 2023-03-24 MED ORDER — ATENOLOL 50 MG PO TABS: 50.0000 mg | ORAL_TABLET | Freq: Every day | ORAL | 1 refills | Status: DC

## 2023-03-24 NOTE — Telephone Encounter (Signed)
Prescription Request  03/24/2023  LOV: 10/08/2022  What is the name of the medication or equipment? atenolol (TENORMIN) 50 MG tablet, 3 month supply  Have you contacted your pharmacy to request a refill? Yes   Which pharmacy would you like this sent to?  [facility] - [place], [place] - [address] [place] [address] Phone: [phone] Fax: [phone]     Patient notified that their request is being sent to the clinical staff for review and that they should receive a response within 2 business days.   Please advise at Mobile [phone] (mobile)
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
## 2023-03-24 NOTE — Telephone Encounter (Signed)
Erx sent

## 2023-04-10 ENCOUNTER — Telehealth: Payer: Self-pay | Admitting: Family Medicine

## 2023-04-10 DIAGNOSIS — I10 Essential (primary) hypertension: Secondary | ICD-10-CM

## 2023-04-10 MED ORDER — TRIAMTERENE-HCTZ 37.5-25 MG PO TABS
1.0000 | ORAL_TABLET | Freq: Every day | ORAL | 2 refills | Status: DC
Start: 2023-04-10 — End: 2023-07-13

## 2023-04-10 NOTE — Telephone Encounter (Signed)
Prescription Request  04/10/2023  LOV: 10/08/2022  What is the name of the medication or equipment? triamterene-hydrochlorothiazide (MAXZIDE-25) 37.5-25 MG tablet   Have you contacted your pharmacy to request a refill? Yes   Which pharmacy would you like this sent to?  [facility] - [place], [place] - [address] [place] [address] Phone: [phone] Fax: [phone]   Patient notified that their request is being sent to the clinical staff for review and that they should receive a response within 2 business days.   Please advise at [facility] [phone]   Patient would like 90 day supply sent in

## 2023-04-10 NOTE — Telephone Encounter (Signed)
Erx sent

## 2023-04-22 ENCOUNTER — Encounter: Payer: Self-pay | Admitting: Dermatology

## 2023-04-22 ENCOUNTER — Ambulatory Visit (INDEPENDENT_AMBULATORY_CARE_PROVIDER_SITE_OTHER): Payer: Medicare Other | Admitting: Dermatology

## 2023-04-22 DIAGNOSIS — W908XXA Exposure to other nonionizing radiation, initial encounter: Secondary | ICD-10-CM | POA: Diagnosis not present

## 2023-04-22 DIAGNOSIS — L853 Xerosis cutis: Secondary | ICD-10-CM | POA: Diagnosis not present

## 2023-04-22 DIAGNOSIS — Z5111 Encounter for antineoplastic chemotherapy: Secondary | ICD-10-CM | POA: Diagnosis not present

## 2023-04-22 DIAGNOSIS — L570 Actinic keratosis: Secondary | ICD-10-CM | POA: Diagnosis not present

## 2023-04-22 DIAGNOSIS — L309 Dermatitis, unspecified: Secondary | ICD-10-CM

## 2023-04-22 DIAGNOSIS — R21 Rash and other nonspecific skin eruption: Secondary | ICD-10-CM

## 2023-04-22 DIAGNOSIS — L578 Other skin changes due to chronic exposure to nonionizing radiation: Secondary | ICD-10-CM

## 2023-04-22 DIAGNOSIS — X32XXXA Exposure to sunlight, initial encounter: Secondary | ICD-10-CM | POA: Diagnosis not present

## 2023-04-22 DIAGNOSIS — D2371 Other benign neoplasm of skin of right lower limb, including hip: Secondary | ICD-10-CM | POA: Diagnosis not present

## 2023-04-22 DIAGNOSIS — L649 Androgenic alopecia, unspecified: Secondary | ICD-10-CM

## 2023-04-22 DIAGNOSIS — D492 Neoplasm of unspecified behavior of bone, soft tissue, and skin: Secondary | ICD-10-CM

## 2023-04-22 MED ORDER — CLOBETASOL PROPIONATE 0.05 % EX SOLN
1.0000 | Freq: Two times a day (BID) | CUTANEOUS | 0 refills | Status: DC
Start: 2023-04-22 — End: 2024-07-27

## 2023-04-22 NOTE — Patient Instructions (Addendum)
In the fall - Start 5-fluorouracil solution twice a day for 14 days to affected areas including scalp.    Reviewed course of treatment and expected reaction.  Patient advised to expect inflammation and crusting and advised that erosions are possible.  Patient advised to be diligent with sun protection during and after treatment. Counseled to keep medication out of reach of children and pets.  Eczema Skin Care  Buy TWO 16oz jars of CeraVe moisturizing cream  [facility], [facility], [facility] (no prescription needed)  Costs about $15 per jar   Jar #1: Use as a moisturizer as needed. Can be applied to any area of the body. Use twice daily to unaffected areas. Jar #2: Pour one 50ml bottle of clobetasol 0.05% solution into jar, mix well. Label this jar to indicate the medication has been added. Use twice daily for 2-4 weeks to affected areas then as needed. Do not apply to face, groin or underarms.  Moisturizer may burn or sting initially. Try for at least 4 weeks.   Recommend OTC Gold Bond Rapid Relief Anti-Itch cream (pramoxine + menthol), CeraVe Anti-itch cream or lotion (pramoxine), Sarna lotion (Original- menthol + camphor or Sensitive- pramoxine) or Eucerin 12 hour Itch Relief lotion (menthol) up to 3 times per day to areas on body that are itchy.   Gentle Skin Care Guide  1. Bathe no more than once a day.  2. Avoid bathing in hot water  3. Use a mild soap like Dove, Vanicream, Cetaphil, CeraVe. Can use Lever 2000 or Cetaphil antibacterial soap  4. Use soap only where you need it. On most days, use it under your arms, between your legs, and on your feet. Let the water rinse other areas unless visibly dirty.  5. When you get out of the bath/shower, use a towel to gently blot your skin dry, don't rub it.  6. While your skin is still a little damp, apply a moisturizing cream such as Vanicream, CeraVe, Cetaphil, Eucerin, Sarna lotion or plain Vaseline Jelly. For hands apply Neutrogena [place]  Hand Cream or Excipial Hand Cream.  7. Reapply moisturizer any time you start to itch or feel dry.  8. Sometimes using free and clear laundry detergents can be helpful. Fabric softener sheets should be avoided. Downy Free & Gentle liquid, or any liquid fabric softener that is free of dyes and perfumes, it acceptable to use  9. If your doctor has given you prescription creams you may apply moisturizers over them   Wound Care Instructions  Cleanse wound gently with soap and water once a day then pat dry with clean gauze. Apply a thin coat of Petrolatum (petroleum jelly, "Vaseline") over the wound (unless you have an allergy to this). We recommend that you use a new, sterile tube of Vaseline. Do not pick or remove scabs. Do not remove the yellow or white "healing tissue" from the base of the wound.  Cover the wound with fresh, clean, nonstick gauze and secure with paper tape. You may use Band-Aids in place of gauze and tape if the wound is small enough, but would recommend trimming much of the tape off as there is often too much. Sometimes Band-Aids can irritate the skin.  You should call the office for your biopsy report after 1 week if you have not already been contacted.  If you experience any problems, such as abnormal amounts of bleeding, swelling, significant bruising, significant pain, or evidence of infection, please call the office immediately.  FOR ADULT SURGERY PATIENTS: If  you need something for pain relief you may take 1 extra strength Tylenol (acetaminophen) AND 2 Ibuprofen (200mg  each) together every 4 hours as needed for pain. (do not take these if you are allergic to them or if you have a reason you should not take them.) Typically, you may only need pain medication for 1 to 3 days.    Due to recent changes in healthcare laws, you may see results of your pathology and/or laboratory studies on MyChart before the doctors have had a chance to review them. We understand that in some  cases there may be results that are confusing or concerning to you. Please understand that not all results are received at the same time and often the doctors may need to interpret multiple results in order to provide you with the best plan of care or course of treatment. Therefore, we ask that you please give [place] 2 business days to thoroughly review all your results before contacting the office for clarification. Should we see a critical lab result, you will be contacted sooner.   If You Need Anything After Your Visit  If you have any questions or concerns for your doctor, please call our main line at [phone] and press option 4 to reach your doctor's medical assistant. If no one answers, please leave a voicemail as directed and we will return your call as soon as possible. Messages left after 4 pm will be answered the following business day.   You may also send [place] a message via MyChart. We typically respond to MyChart messages within 1-2 business days.  For prescription refills, please ask your pharmacy to contact our office. Our fax number is [phone].  If you have an urgent issue when the clinic is closed that cannot wait until the next business day, you can page your doctor at the number below.    Please note that while we do our best to be available for urgent issues outside of office hours, we are not available 24/7.   If you have an urgent issue and are unable to reach [place], you may choose to seek medical care at your doctor's office, retail clinic, urgent care center, or emergency room.  If you have a medical emergency, please immediately call 911 or go to the emergency department.  Pager Numbers  - Dr. Gwen Pounds: [phone]  - Dr. Neale Burly: [phone]  - Dr. Roseanne Reno: [phone]  In the event of inclement weather, please call our main line at [phone] for an update on the status of any delays or closures.  Dermatology Medication Tips: Please keep the boxes that  topical medications come in in order to help keep track of the instructions about where and how to use these. Pharmacies typically print the medication instructions only on the boxes and not directly on the medication tubes.   If your medication is too expensive, please contact our office at [phone] option 4 or send [place] a message through MyChart.   We are unable to tell what your co-pay for medications will be in advance as this is different depending on your insurance coverage. However, we may be able to find a substitute medication at lower cost or fill out paperwork to get insurance to cover a needed medication.   If a prior authorization is required to get your medication covered by your insurance company, please allow [place] 1-2 business days to complete this process.  Drug prices often vary depending on where the prescription is filled and some pharmacies may  offer cheaper prices.  The website www.goodrx.com contains coupons for medications through different pharmacies. The prices here do not account for what the cost may be with help from insurance (it may be cheaper with your insurance), but the website can give you the price if you did not use any insurance.  - You can print the associated coupon and take it with your prescription to the pharmacy.  - You may also stop by our office during regular business hours and pick up a GoodRx coupon card.  - If you need your prescription sent electronically to a different pharmacy, notify our office through [facility] or by phone at [phone] option 4.

## 2023-04-22 NOTE — Progress Notes (Unsigned)
Follow-Up Visit   Subjective  Jennifer [name] is a 72 y.o. female who presents for the following: AK follow up at scalp. Patient was prescribed 5FU solution but could not read directions so she has not used it. Patient not using anything to treat alopecia.   Patient does have some areas at back and scalp that itch constantly.   The following portions of the chart were reviewed this encounter and updated as appropriate: medications, allergies, medical history  Review of Systems:  No other skin or systemic complaints except as noted in HPI or Assessment and Plan.  Objective  Well appearing patient in no apparent distress; mood and affect are within normal limits.   A focused examination was performed of the following areas: Legs, scalp  Relevant exam findings are noted in the Assessment and Plan.  right medial pretibial 1.1 cm pink plaque R/o SCC       Assessment & Plan   ACTINIC DAMAGE WITH PRECANCEROUS ACTINIC KERATOSES Counseling for Topical Chemotherapy Management: Patient exhibits: - Severe, confluent actinic changes with pre-cancerous actinic keratoses that is secondary to cumulative UV radiation exposure over time - Condition that is severe; chronic, not at goal. - diffuse scaly erythematous macules and papules with underlying dyspigmentation - Discussed Prescription "Field Treatment" topical Chemotherapy for Severe, Chronic Confluent Actinic Changes with Pre-Cancerous Actinic Keratoses Field treatment involves treatment of an entire area of skin that has confluent Actinic Changes (Sun/ Ultraviolet light damage) and PreCancerous Actinic Keratoses by method of PhotoDynamic Therapy (PDT) and/or prescription Topical Chemotherapy agents such as 5-fluorouracil, 5-fluorouracil/calcipotriene, and/or imiquimod.  The purpose is to decrease the number of clinically evident and subclinical PreCancerous lesions to prevent progression to development of skin cancer by chemically  destroying early precancer changes that may or may not be visible.  It has been shown to reduce the risk of developing skin cancer in the treated area. As a result of treatment, redness, scaling, crusting, and open sores may occur during treatment course. One or more than one of these methods may be used and may have to be used several times to control, suppress and eliminate the PreCancerous changes. Discussed treatment course, expected reaction, and possible side effects. - Recommend daily broad spectrum sunscreen SPF 30+ to sun-exposed areas, reapply every 2 hours as needed.  - Staying in the shade or wearing long sleeves, sun glasses (UVA+UVB protection) and wide brim hats (4-inch brim around the entire circumference of the hat) are also recommended. - Call for new or changing lesions. - start 5-fluorouracil solution twice a day for 14 days to affected areas including scalp.    Reviewed course of treatment and expected reaction.  Patient advised to expect inflammation and crusting and advised that erosions are possible.  Patient advised to be diligent with sun protection during and after treatment. Counseled to keep medication out of reach of children and pets.  Neoplasm of skin right medial pretibial  Skin / nail biopsy Type of biopsy: tangential   Informed consent: discussed and consent obtained   Timeout: patient name, date of birth, surgical site, and procedure verified   Procedure prep:  Patient was prepped and draped in usual sterile fashion Prep type:  Isopropyl alcohol Anesthesia: the lesion was anesthetized in a standard fashion   Anesthetic:  1% lidocaine w/ epinephrine 1-100,000 buffered w/ 8.4% NaHCO3 Instrument used: #15 blade   Hemostasis achieved with: aluminum chloride   Outcome: patient tolerated procedure well   Post-procedure details: wound care instructions given  Additional details:  Petrolatum and a pressure bandage applied  Specimen 1 - Surgical  pathology Differential Diagnosis: R/o SCC  Check Margins: No 1.1 cm pink plaque   Androgenic alopecia  Related Medications dutasteride (AVODART) 0.5 MG capsule Take 1 capsule (0.5 mg total) by mouth daily.  clobetasol (TEMOVATE) 0.05 % external solution Apply 1 Application topically 2 (two) times daily.  Rash  Related Medications clobetasol (TEMOVATE) 0.05 % external solution Apply 1 Application topically 2 (two) times daily. Patient to mix 50 mL in with a jar of CeraVe cream. Apply twice daily for rash. Avoid applying to face, groin, and axilla. Use as directed. Long-term use can cause thinning of the skin.  Xerosis - diffuse xerotic patches - recommend gentle, hydrating skin care - gentle skin care handout given  Dermatitis  Exam: Scaly pink papules coalescing to plaques  Treatment Plan: Eczema Skin Care  Buy TWO 16oz jars of CeraVe moisturizing cream  [facility], [facility], [facility] (no prescription needed)  Costs about $15 per jar   Jar #1: Use as a moisturizer as needed. Can be applied to any area of the body. Use twice daily to unaffected areas. Jar #2: Pour one 50ml bottle of clobetasol 0.05% solution into jar, mix well. Label this jar to indicate the medication has been added. Use twice daily for 2-4 weeks to affected areas then as needed. Do not apply to face, groin or underarms.  Moisturizer may burn or sting initially. Try for at least 4 weeks.   Patient has at home.   Return for sun exposed, Hx DN, recheck scalp Sept with Dr. Katrinka Blazing, first available TBSE with Dr. Roseanne Reno.  Anise Salvo, RMA, am acting as scribe for Darden Dates, MD .   Documentation: I have reviewed the above documentation for accuracy and completeness, and I agree with the above.  Darden Dates, MD

## 2023-04-28 ENCOUNTER — Telehealth: Payer: Self-pay

## 2023-04-28 NOTE — Telephone Encounter (Addendum)
  Discussed results with patient. She verbalized understanding and denied further questions.   ----- Message from Sandi Mealy, MD sent at 04/25/2023 10:02 AM EDT ----- Skin , right medial pretibial DERMATOFIBROMA   This is a benign scar ball. No additional treatment is needed unless it is bothersome.  Please call us at [phone] option 4 or message [place] on MyChart if you have any questions or concerns.   MAs please call. Thank you!

## 2023-05-08 ENCOUNTER — Ambulatory Visit: Payer: Medicare Other | Admitting: Dermatology

## 2023-05-23 ENCOUNTER — Other Ambulatory Visit: Payer: Self-pay | Admitting: Family Medicine

## 2023-05-23 DIAGNOSIS — G2581 Restless legs syndrome: Secondary | ICD-10-CM

## 2023-05-28 DIAGNOSIS — H26493 Other secondary cataract, bilateral: Secondary | ICD-10-CM | POA: Diagnosis not present

## 2023-05-28 DIAGNOSIS — H35039 Hypertensive retinopathy, unspecified eye: Secondary | ICD-10-CM | POA: Diagnosis not present

## 2023-05-28 DIAGNOSIS — H43813 Vitreous degeneration, bilateral: Secondary | ICD-10-CM | POA: Diagnosis not present

## 2023-05-28 DIAGNOSIS — Z961 Presence of intraocular lens: Secondary | ICD-10-CM | POA: Diagnosis not present

## 2023-07-10 ENCOUNTER — Ambulatory Visit
Admission: RE | Admit: 2023-07-10 | Discharge: 2023-07-10 | Disposition: A | Discharge status: Home / Self Care | Payer: Medicare Other | Source: Ambulatory Visit | Attending: Family Medicine | Admitting: Family Medicine

## 2023-07-10 ENCOUNTER — Encounter: Payer: Self-pay | Admitting: Family Medicine

## 2023-07-10 ENCOUNTER — Ambulatory Visit (INDEPENDENT_AMBULATORY_CARE_PROVIDER_SITE_OTHER): Payer: Medicare Other | Admitting: Family Medicine

## 2023-07-10 ENCOUNTER — Ambulatory Visit (INDEPENDENT_AMBULATORY_CARE_PROVIDER_SITE_OTHER): Payer: Medicare Other

## 2023-07-10 VITALS — BP 130/80 | HR 61 | Temp 98.4°F | Ht 59.0 in | Wt 225.0 lb

## 2023-07-10 VITALS — BP 130/80 | HR 61 | Temp 98.4°F | Resp 17 | Ht 59.0 in | Wt 225.0 lb

## 2023-07-10 DIAGNOSIS — E041 Nontoxic single thyroid nodule: Secondary | ICD-10-CM

## 2023-07-10 DIAGNOSIS — E042 Nontoxic multinodular goiter: Secondary | ICD-10-CM | POA: Diagnosis not present

## 2023-07-10 DIAGNOSIS — M4316 Spondylolisthesis, lumbar region: Secondary | ICD-10-CM | POA: Diagnosis not present

## 2023-07-10 DIAGNOSIS — R059 Cough, unspecified: Secondary | ICD-10-CM

## 2023-07-10 DIAGNOSIS — M5430 Sciatica, unspecified side: Secondary | ICD-10-CM

## 2023-07-10 DIAGNOSIS — M47816 Spondylosis without myelopathy or radiculopathy, lumbar region: Secondary | ICD-10-CM | POA: Diagnosis not present

## 2023-07-10 DIAGNOSIS — Z0000 Encounter for general adult medical examination without abnormal findings: Secondary | ICD-10-CM | POA: Diagnosis not present

## 2023-07-10 DIAGNOSIS — G2581 Restless legs syndrome: Secondary | ICD-10-CM | POA: Diagnosis not present

## 2023-07-10 DIAGNOSIS — Z780 Asymptomatic menopausal state: Secondary | ICD-10-CM

## 2023-07-10 DIAGNOSIS — E559 Vitamin D deficiency, unspecified: Secondary | ICD-10-CM | POA: Diagnosis not present

## 2023-07-10 DIAGNOSIS — Z7189 Other specified counseling: Secondary | ICD-10-CM | POA: Diagnosis not present

## 2023-07-10 DIAGNOSIS — I10 Essential (primary) hypertension: Secondary | ICD-10-CM

## 2023-07-10 DIAGNOSIS — R739 Hyperglycemia, unspecified: Secondary | ICD-10-CM | POA: Diagnosis not present

## 2023-07-10 DIAGNOSIS — Z1231 Encounter for screening mammogram for malignant neoplasm of breast: Secondary | ICD-10-CM

## 2023-07-10 DIAGNOSIS — M545 Low back pain, unspecified: Secondary | ICD-10-CM | POA: Diagnosis not present

## 2023-07-10 LAB — CBC WITH DIFFERENTIAL/PLATELET
Basophils Absolute: 0 10*3/uL (ref 0.0–0.1)
Basophils Relative: 0.8 % (ref 0.0–3.0)
Eosinophils Absolute: 0.1 10*3/uL (ref 0.0–0.7)
Eosinophils Relative: 2 % (ref 0.0–5.0)
HCT: 42.7 % (ref 36.0–46.0)
Hemoglobin: 14.1 g/dL (ref 12.0–15.0)
Lymphocytes Relative: 19.9 % (ref 12.0–46.0)
Lymphs Abs: 1.2 10*3/uL (ref 0.7–4.0)
MCHC: 33.1 g/dL (ref 30.0–36.0)
MCV: 93.8 fl (ref 78.0–100.0)
Monocytes Absolute: 0.6 10*3/uL (ref 0.1–1.0)
Monocytes Relative: 10.3 % (ref 3.0–12.0)
Neutro Abs: 4.2 10*3/uL (ref 1.4–7.7)
Neutrophils Relative %: 67 % (ref 43.0–77.0)
Platelets: 243 10*3/uL (ref 150.0–400.0)
RBC: 4.56 Mil/uL (ref 3.87–5.11)
RDW: 13.5 % (ref 11.5–15.5)
WBC: 6.2 10*3/uL (ref 4.0–10.5)

## 2023-07-10 LAB — COMPREHENSIVE METABOLIC PANEL
ALT: 21 U/L (ref 0–35)
AST: 23 U/L (ref 0–37)
Albumin: 4.3 g/dL (ref 3.5–5.2)
Alkaline Phosphatase: 61 U/L (ref 39–117)
BUN: 11 mg/dL (ref 6–23)
CO2: 34 meq/L — ABNORMAL HIGH (ref 19–32)
Calcium: 9.8 mg/dL (ref 8.4–10.5)
Chloride: 93 mEq/L — ABNORMAL LOW (ref 96–112)
Creatinine, Ser: 0.81 mg/dL (ref 0.40–1.20)
GFR: 72.79 mL/min (ref >60.00)
Glucose, Bld: 118 mg/dL — ABNORMAL HIGH (ref 70–99)
Potassium: 3.5 mEq/L (ref 3.5–5.1)
Sodium: 138 meq/L (ref 135–145)
Total Bilirubin: 0.6 mg/dL (ref 0.2–1.2)
Total Protein: 7.8 g/dL (ref 6.0–8.3)

## 2023-07-10 LAB — LIPID PANEL
Cholesterol: 232 mg/dL — ABNORMAL HIGH (ref 0–200)
HDL: 55 mg/dL (ref >39.00)
LDL Cholesterol: 148 mg/dL — ABNORMAL HIGH (ref 0–99)
NonHDL: 176.67
Total CHOL/HDL Ratio: 4
Triglycerides: 144 mg/dL (ref 0.0–149.0)
VLDL: 28.8 mg/dL (ref 0.0–40.0)

## 2023-07-10 LAB — HEMOGLOBIN A1C: Hgb A1c MFr Bld: 6.5 % (ref 4.6–6.5)

## 2023-07-10 NOTE — Patient Instructions (Addendum)
Check with pharmacy about previous pneumonia/tetanus/shingles shot.  I would get a flu shot each fall.   Please call about a mammogram and bone density test.  Take care.  Glad to see you.  If your back film doesn't have new issues, then PT and tylenol are reasonable.
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
## 2023-07-10 NOTE — Patient Instructions (Addendum)
Jennifer [name] , Thank you for taking time to come for your Medicare Wellness Visit. I appreciate your ongoing commitment to your health goals. Please review the following plan we discussed and let me know if I can assist you in the future.   Referrals/Orders/Follow-Ups/Clinician Recommendations: Aim for 30 minutes of exercise or brisk walking, 6-8 glasses of water, and 5 servings of fruits and vegetables each day.   You have an order for:  []   2D Mammogram  [x]   3D Mammogram  [x]   Bone Density     Please call for appointment:  [facility] [address] [place], [place] [address] [phone]  [facility] [address] [place], [place] [address] [phone]  [facility] [address] [place], [place] [address] [phone]  [facility] [place] [facility] [address] [place], [place] [address] [phone]  [facility] [address] [place], [place] [address] [phone]    Make sure to wear two-piece clothing.  No lotions, powders, or deodorants the day of the appointment. Make sure to bring picture ID and insurance card.  Bring list of medications you are currently taking including any supplements.   Schedule your [place] screening mammogram through MyChart!   Log into your MyChart account.  Go to 'Visit' (or 'Appointments' if on mobile App) --> Schedule an Appointment  Under 'Select a Reason for Visit' choose the Mammogram Screening option.  Complete the pre-visit questions and select the time and place that best fits your schedule.    This is a list of the screening recommended for you and due dates:  Health Maintenance  Topic Date Due   DTaP/Tdap/Td vaccine (1 - Tdap) Never done   Zoster (Shingles) Vaccine (1 of 2) Never done   Pneumonia Vaccine (1 of 1 - PCV) Never done   COVID-19 Vaccine (4 - 2023-24 season) 07/19/2022   Medicare  Annual Wellness Visit  05/18/2023   Flu Shot  06/19/2023   Cologuard (Stool DNA test)  02/18/2024   Mammogram  08/26/2024   DEXA scan (bone density measurement)  Completed   Hepatitis C Screening  Completed   HPV Vaccine  Aged Out    Advanced directives: (Copy Requested) Please bring a copy of your health care power of attorney and living will to the office to be added to your chart at your convenience.  Next Medicare Annual Wellness Visit scheduled for next year: Yes
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
## 2023-07-11 LAB — VITAMIN D 25 HYDROXY (VIT D DEFICIENCY, FRACTURES): VITD: 42.87 ng/mL (ref 30.00–100.00)

## 2023-07-11 LAB — TSH: TSH: 1.44 u[IU]/mL (ref 0.35–5.50)

## 2023-07-13 ENCOUNTER — Other Ambulatory Visit: Payer: Self-pay | Admitting: Family Medicine

## 2023-07-13 ENCOUNTER — Encounter: Payer: Self-pay | Admitting: Family Medicine

## 2023-07-13 DIAGNOSIS — I10 Essential (primary) hypertension: Secondary | ICD-10-CM

## 2023-07-13 DIAGNOSIS — E876 Hypokalemia: Secondary | ICD-10-CM

## 2023-07-13 DIAGNOSIS — M5430 Sciatica, unspecified side: Secondary | ICD-10-CM | POA: Insufficient documentation

## 2023-07-13 DIAGNOSIS — R059 Cough, unspecified: Secondary | ICD-10-CM | POA: Insufficient documentation

## 2023-07-13 DIAGNOSIS — E119 Type 2 diabetes mellitus without complications: Secondary | ICD-10-CM | POA: Insufficient documentation

## 2023-07-13 DIAGNOSIS — Z0000 Encounter for general adult medical examination without abnormal findings: Secondary | ICD-10-CM | POA: Insufficient documentation

## 2023-07-13 DIAGNOSIS — G2581 Restless legs syndrome: Secondary | ICD-10-CM

## 2023-07-13 MED ORDER — ATENOLOL 50 MG PO TABS
50.0000 mg | ORAL_TABLET | Freq: Every day | ORAL | 3 refills | Status: DC
Start: 2023-07-13 — End: 2024-07-20

## 2023-07-13 MED ORDER — POTASSIUM CHLORIDE CRYS ER 20 MEQ PO TBCR
EXTENDED_RELEASE_TABLET | ORAL | 3 refills | Status: DC
Start: 2023-07-13 — End: 2024-07-27

## 2023-07-13 MED ORDER — AMLODIPINE BESYLATE 5 MG PO TABS
5.0000 mg | ORAL_TABLET | Freq: Every day | ORAL | 3 refills | Status: DC
Start: 2023-07-13 — End: 2024-03-18

## 2023-07-13 MED ORDER — ROPINIROLE HCL 0.5 MG PO TABS
0.5000 mg | ORAL_TABLET | Freq: Every day | ORAL | 3 refills | Status: DC
Start: 2023-07-13 — End: 2024-07-28

## 2023-07-13 MED ORDER — TRIAMTERENE-HCTZ 37.5-25 MG PO TABS
1.0000 | ORAL_TABLET | Freq: Every day | ORAL | 3 refills | Status: DC
Start: 2023-07-13 — End: 2024-06-23

## 2023-07-13 NOTE — Assessment & Plan Note (Signed)
Continue Requip. 

## 2023-07-13 NOTE — Assessment & Plan Note (Signed)
Advance directive discussed with patient.  Husband designated if patient were incapacitated. 

## 2023-07-13 NOTE — Assessment & Plan Note (Addendum)
History of.  Could have been GERD related. Discussed not using PPI daily.  Cough resolved.  Rare episodic epigastic discomfort. Mild sx.  Not on PPI currently.  It sounds like she wouldn't have to start now.  Discussed.

## 2023-07-13 NOTE — Assessment & Plan Note (Signed)
If her back film doesn't identify no issues, then PT and tylenol are reasonable.  See notes on imaging.

## 2023-07-13 NOTE — Assessment & Plan Note (Signed)
H/o thyroid nodule.  D/w pt about getting f/u u/s.  Had seen Dr. Doris Cheadle.  Thyroid ultrasound ordered.
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
## 2023-07-13 NOTE — Assessment & Plan Note (Signed)
Continue triamterene hydrochlorothiazide.  Continue atenolol.

## 2023-07-17 ENCOUNTER — Ambulatory Visit
Admission: RE | Admit: 2023-07-17 | Discharge: 2023-07-17 | Disposition: A | Discharge status: Home / Self Care | Payer: Medicare Other | Source: Ambulatory Visit | Attending: Family Medicine | Admitting: Family Medicine

## 2023-07-17 DIAGNOSIS — E042 Nontoxic multinodular goiter: Secondary | ICD-10-CM | POA: Diagnosis not present

## 2023-07-17 DIAGNOSIS — E041 Nontoxic single thyroid nodule: Secondary | ICD-10-CM

## 2023-07-22 ENCOUNTER — Ambulatory Visit: Payer: Medicare Other | Admitting: Dermatology

## 2023-08-06 ENCOUNTER — Telehealth: Payer: Self-pay | Admitting: Family Medicine

## 2023-08-06 NOTE — Telephone Encounter (Signed)
Pt's response to 07/17/23 thyroid US result notes.

## 2023-08-06 NOTE — Telephone Encounter (Signed)
Noted. Thanks.

## 2023-08-06 NOTE — Telephone Encounter (Signed)
Patient called in returning call she received. Relayed message regarding thyroid. Patient stated that they have been there for 12 yrs and she doesn't want to do anything.

## 2023-08-25 ENCOUNTER — Encounter: Payer: Self-pay | Admitting: Physician Assistant

## 2023-08-25 ENCOUNTER — Other Ambulatory Visit (INDEPENDENT_AMBULATORY_CARE_PROVIDER_SITE_OTHER): Payer: Self-pay

## 2023-08-25 ENCOUNTER — Ambulatory Visit (INDEPENDENT_AMBULATORY_CARE_PROVIDER_SITE_OTHER): Payer: Medicare Other | Admitting: Physician Assistant

## 2023-08-25 DIAGNOSIS — M170 Bilateral primary osteoarthritis of knee: Secondary | ICD-10-CM

## 2023-08-25 DIAGNOSIS — M5441 Lumbago with sciatica, right side: Secondary | ICD-10-CM

## 2023-08-25 DIAGNOSIS — M1712 Unilateral primary osteoarthritis, left knee: Secondary | ICD-10-CM

## 2023-08-25 DIAGNOSIS — G8929 Other chronic pain: Secondary | ICD-10-CM

## 2023-08-25 DIAGNOSIS — M1711 Unilateral primary osteoarthritis, right knee: Secondary | ICD-10-CM

## 2023-08-25 MED ORDER — METHYLPREDNISOLONE ACETATE 40 MG/ML IJ SUSP
40.0000 mg | INTRAMUSCULAR | Status: AC | PRN
Start: 2023-08-25 — End: 2023-08-25
  Administered 2023-08-25: 40 mg via INTRA_ARTICULAR

## 2023-08-25 MED ORDER — LIDOCAINE HCL 1 % IJ SOLN
3.0000 mL | INTRAMUSCULAR | Status: AC | PRN
Start: 2023-08-25 — End: 2023-08-25
  Administered 2023-08-25: 3 mL

## 2023-08-25 NOTE — Addendum Note (Signed)
Addended by: Mardene Celeste B on: 08/25/2023 02:17 PM   Modules accepted: Orders
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
## 2023-08-28 DIAGNOSIS — Z1231 Encounter for screening mammogram for malignant neoplasm of breast: Secondary | ICD-10-CM | POA: Diagnosis not present

## 2023-08-28 LAB — HM MAMMOGRAPHY

## 2023-08-29 ENCOUNTER — Encounter: Payer: Self-pay | Admitting: Internal Medicine

## 2023-09-01 ENCOUNTER — Encounter: Payer: Self-pay | Admitting: Physical Therapy

## 2023-09-01 ENCOUNTER — Ambulatory Visit (INDEPENDENT_AMBULATORY_CARE_PROVIDER_SITE_OTHER): Payer: Medicare Other | Admitting: Physical Therapy

## 2023-09-01 ENCOUNTER — Other Ambulatory Visit: Payer: Self-pay

## 2023-09-01 DIAGNOSIS — R293 Abnormal posture: Secondary | ICD-10-CM

## 2023-09-01 DIAGNOSIS — M5459 Other low back pain: Secondary | ICD-10-CM

## 2023-09-01 DIAGNOSIS — M6281 Muscle weakness (generalized): Secondary | ICD-10-CM | POA: Diagnosis not present
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
## 2023-09-08 ENCOUNTER — Encounter: Payer: Self-pay | Admitting: Physical Therapy

## 2023-09-08 ENCOUNTER — Ambulatory Visit (INDEPENDENT_AMBULATORY_CARE_PROVIDER_SITE_OTHER): Payer: Medicare Other | Admitting: Physical Therapy

## 2023-09-08 DIAGNOSIS — M5459 Other low back pain: Secondary | ICD-10-CM

## 2023-09-08 DIAGNOSIS — M6281 Muscle weakness (generalized): Secondary | ICD-10-CM

## 2023-09-08 DIAGNOSIS — R293 Abnormal posture: Secondary | ICD-10-CM | POA: Diagnosis not present
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
## 2023-09-11 ENCOUNTER — Ambulatory Visit (INDEPENDENT_AMBULATORY_CARE_PROVIDER_SITE_OTHER): Payer: Medicare Other | Admitting: Physical Therapy

## 2023-09-11 ENCOUNTER — Encounter: Payer: Self-pay | Admitting: Physical Therapy

## 2023-09-11 DIAGNOSIS — M5459 Other low back pain: Secondary | ICD-10-CM | POA: Diagnosis not present

## 2023-09-11 DIAGNOSIS — R293 Abnormal posture: Secondary | ICD-10-CM | POA: Diagnosis not present

## 2023-09-11 DIAGNOSIS — M6281 Muscle weakness (generalized): Secondary | ICD-10-CM
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
## 2023-09-16 ENCOUNTER — Ambulatory Visit (INDEPENDENT_AMBULATORY_CARE_PROVIDER_SITE_OTHER): Payer: Medicare Other | Admitting: Physical Therapy

## 2023-09-16 ENCOUNTER — Encounter: Payer: Self-pay | Admitting: Physical Therapy

## 2023-09-16 DIAGNOSIS — M5459 Other low back pain: Secondary | ICD-10-CM

## 2023-09-16 DIAGNOSIS — R293 Abnormal posture: Secondary | ICD-10-CM | POA: Diagnosis not present

## 2023-09-16 DIAGNOSIS — M6281 Muscle weakness (generalized): Secondary | ICD-10-CM
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
## 2023-09-18 ENCOUNTER — Ambulatory Visit (INDEPENDENT_AMBULATORY_CARE_PROVIDER_SITE_OTHER): Payer: Medicare Other | Admitting: Physical Therapy

## 2023-09-18 DIAGNOSIS — R293 Abnormal posture: Secondary | ICD-10-CM

## 2023-09-18 DIAGNOSIS — M5459 Other low back pain: Secondary | ICD-10-CM

## 2023-09-18 DIAGNOSIS — M6281 Muscle weakness (generalized): Secondary | ICD-10-CM
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
## 2023-09-22 ENCOUNTER — Ambulatory Visit (INDEPENDENT_AMBULATORY_CARE_PROVIDER_SITE_OTHER): Payer: Medicare Other | Admitting: Physical Therapy

## 2023-09-22 ENCOUNTER — Encounter: Payer: Self-pay | Admitting: Physical Therapy

## 2023-09-22 DIAGNOSIS — R293 Abnormal posture: Secondary | ICD-10-CM | POA: Diagnosis not present

## 2023-09-22 DIAGNOSIS — M5459 Other low back pain: Secondary | ICD-10-CM | POA: Diagnosis not present

## 2023-09-22 DIAGNOSIS — M6281 Muscle weakness (generalized): Secondary | ICD-10-CM | POA: Diagnosis not present
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
## 2023-09-25 ENCOUNTER — Encounter: Payer: Self-pay | Admitting: Physical Therapy

## 2023-09-25 ENCOUNTER — Ambulatory Visit: Payer: Medicare Other | Admitting: Physical Therapy

## 2023-09-25 DIAGNOSIS — M6281 Muscle weakness (generalized): Secondary | ICD-10-CM

## 2023-09-25 DIAGNOSIS — R293 Abnormal posture: Secondary | ICD-10-CM | POA: Diagnosis not present

## 2023-09-25 DIAGNOSIS — M5459 Other low back pain: Secondary | ICD-10-CM | POA: Diagnosis not present
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
## 2023-09-26 ENCOUNTER — Encounter: Payer: Medicare Other | Admitting: Physical Therapy

## 2023-09-29 ENCOUNTER — Encounter: Payer: Self-pay | Admitting: Family Medicine

## 2023-09-29 ENCOUNTER — Encounter: Payer: Self-pay | Admitting: Physical Therapy

## 2023-09-29 ENCOUNTER — Ambulatory Visit (INDEPENDENT_AMBULATORY_CARE_PROVIDER_SITE_OTHER): Payer: Medicare Other | Admitting: Physical Therapy

## 2023-09-29 ENCOUNTER — Telehealth: Payer: Self-pay | Admitting: Family Medicine

## 2023-09-29 DIAGNOSIS — R293 Abnormal posture: Secondary | ICD-10-CM

## 2023-09-29 DIAGNOSIS — M5459 Other low back pain: Secondary | ICD-10-CM | POA: Diagnosis not present

## 2023-09-29 DIAGNOSIS — M6281 Muscle weakness (generalized): Secondary | ICD-10-CM

## 2023-09-29 NOTE — Telephone Encounter (Signed)
Patient called regarding overdue DM eye exam, patient says she had this done on 05/23/2023 at [facility] center Pt wanted Para March to be advised

## 2023-09-29 NOTE — Telephone Encounter (Signed)
Letter sent to get records from eye exam.
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
## 2023-10-01 ENCOUNTER — Telehealth: Payer: Self-pay

## 2023-10-01 NOTE — Telephone Encounter (Signed)
Noted. Thanks.

## 2023-10-01 NOTE — Telephone Encounter (Signed)
Error

## 2023-10-01 NOTE — Telephone Encounter (Signed)
Sent request for diabetic eye exam report for patient to [facility] but the patient did not report that she has diabetes. Therefore no diabetic exam results

## 2023-10-02 ENCOUNTER — Encounter: Payer: Medicare Other | Admitting: Physical Therapy

## 2023-10-13 ENCOUNTER — Encounter: Payer: Medicare Other | Admitting: Physical Therapy

## 2023-10-20 ENCOUNTER — Ambulatory Visit: Payer: Medicare Other | Admitting: Family Medicine

## 2023-12-19 DIAGNOSIS — H26493 Other secondary cataract, bilateral: Secondary | ICD-10-CM | POA: Diagnosis not present

## 2023-12-23 ENCOUNTER — Telehealth: Payer: Self-pay | Admitting: Physician Assistant

## 2023-12-23 NOTE — Telephone Encounter (Signed)
Patient states physical therapy did not help and she wants to know what's the next step. Can she be referred to a Neurologist or what else can be done to help with the tingling and numbness in her legs ?

## 2023-12-24 ENCOUNTER — Other Ambulatory Visit: Payer: Self-pay | Admitting: Radiology

## 2023-12-24 DIAGNOSIS — G8929 Other chronic pain: Secondary | ICD-10-CM

## 2023-12-24 NOTE — Telephone Encounter (Signed)
 Ordered.   Do I need to call patient or have you spoken with her?

## 2023-12-30 ENCOUNTER — Ambulatory Visit: Payer: Medicare Other | Admitting: Dermatology

## 2023-12-30 DIAGNOSIS — Z23 Encounter for immunization: Secondary | ICD-10-CM | POA: Diagnosis not present

## 2024-01-01 ENCOUNTER — Other Ambulatory Visit: Payer: Medicare Other

## 2024-01-02 DIAGNOSIS — H26493 Other secondary cataract, bilateral: Secondary | ICD-10-CM | POA: Diagnosis not present

## 2024-01-03 ENCOUNTER — Ambulatory Visit
Admission: RE | Admit: 2024-01-03 | Discharge: 2024-01-03 | Disposition: A | Discharge status: Home / Self Care | Payer: Medicare Other | Source: Ambulatory Visit | Attending: Physician Assistant | Admitting: Physician Assistant

## 2024-01-03 DIAGNOSIS — G8929 Other chronic pain: Secondary | ICD-10-CM

## 2024-01-03 DIAGNOSIS — M48061 Spinal stenosis, lumbar region without neurogenic claudication: Secondary | ICD-10-CM | POA: Diagnosis not present

## 2024-01-03 DIAGNOSIS — M47816 Spondylosis without myelopathy or radiculopathy, lumbar region: Secondary | ICD-10-CM | POA: Diagnosis not present

## 2024-01-08 ENCOUNTER — Ambulatory Visit: Payer: Medicare Other | Admitting: Physician Assistant

## 2024-01-19 ENCOUNTER — Ambulatory Visit: Payer: Medicare Other | Admitting: Physician Assistant

## 2024-01-27 ENCOUNTER — Ambulatory Visit (INDEPENDENT_AMBULATORY_CARE_PROVIDER_SITE_OTHER): Admitting: Physician Assistant

## 2024-01-27 ENCOUNTER — Encounter: Payer: Self-pay | Admitting: Physician Assistant

## 2024-01-27 DIAGNOSIS — M7061 Trochanteric bursitis, right hip: Secondary | ICD-10-CM

## 2024-01-27 DIAGNOSIS — G8929 Other chronic pain: Secondary | ICD-10-CM

## 2024-01-27 DIAGNOSIS — M5441 Lumbago with sciatica, right side: Secondary | ICD-10-CM

## 2024-01-27 NOTE — Progress Notes (Signed)
 HPI: Jennifer [name] returns today to go over the MRI of her lumbar spine.  MRI images from 01/03/2024 are reviewed with the patient.  MRI shows mild disc bulge at L3-4 and L4-5.  Also facet arthritis at L3-4 moderate facet arthritis at L4-5.  Mild foraminal stenosis L3-4 and L4-5.  No central canal stenosis throughout.   She notes that therapy particularly dry needling has been helpful and usually helps for 2 days but then her pain comes back.  She is mostly complaining of low back pain which she states she can deal with but she is also having right radicular symptoms down the leg to the upper lower leg lateral distribution states pain is worse with activity.  6 out of 10 pain.  Describes it as numbness tingling.  Review of systems: Negative for fevers chills.  Otherwise see HPI.  Physical exam: General Well-developed well-nourished female no acute distress mood and affect appropriate.  Psych alert and oriented x 3 Lower extremities negative straight leg raise bilaterally 5 out of 5 strengths throughout the lower extremities against resistance. Bilateral hips good range of motion.  Tenderness over the right trochanteric region.  No real tenderness over the left trochanteric region.  Impression: Lumbar radiculopathy right leg Facet arthritic changes L3-4 and L4-5 Trochanteric bursitis right hip  Plan: Recommend epidural steroid injection Dr. Alvester Morin for the low back pain and radiculopathy down the right leg.  Offered her cortisone injection for the trochanteric bursitis she defers.  Therefore recommended IT band stretching.  She will follow-up with [place] approximately 4 weeks after the injection with Dr. Alvester Morin to see how she is doing overall.  Questions were encouraged and answered at length.

## 2024-01-28 ENCOUNTER — Other Ambulatory Visit: Payer: Self-pay

## 2024-01-28 DIAGNOSIS — G8929 Other chronic pain: Secondary | ICD-10-CM

## 2024-02-10 ENCOUNTER — Encounter: Payer: Self-pay | Admitting: Physical Medicine and Rehabilitation

## 2024-02-10 ENCOUNTER — Ambulatory Visit: Admitting: Physical Medicine and Rehabilitation

## 2024-02-10 DIAGNOSIS — M48061 Spinal stenosis, lumbar region without neurogenic claudication: Secondary | ICD-10-CM

## 2024-02-10 DIAGNOSIS — G8929 Other chronic pain: Secondary | ICD-10-CM | POA: Diagnosis not present

## 2024-02-10 DIAGNOSIS — M47816 Spondylosis without myelopathy or radiculopathy, lumbar region: Secondary | ICD-10-CM | POA: Diagnosis not present

## 2024-02-10 DIAGNOSIS — M5416 Radiculopathy, lumbar region: Secondary | ICD-10-CM

## 2024-02-10 DIAGNOSIS — M5441 Lumbago with sciatica, right side: Secondary | ICD-10-CM

## 2024-02-10 NOTE — Progress Notes (Unsigned)
 Pain Scale   Average Pain 0 Pain radiating to right leg
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
## 2024-02-26 ENCOUNTER — Ambulatory Visit: Admitting: Physical Medicine and Rehabilitation

## 2024-02-26 ENCOUNTER — Other Ambulatory Visit: Payer: Self-pay

## 2024-02-26 VITALS — BP 143/61 | HR 65

## 2024-02-26 DIAGNOSIS — M5416 Radiculopathy, lumbar region: Secondary | ICD-10-CM

## 2024-02-26 MED ORDER — METHYLPREDNISOLONE ACETATE 40 MG/ML IJ SUSP
40.0000 mg | Freq: Once | INTRAMUSCULAR | Status: AC
Start: 2024-02-26 — End: 2024-02-26
  Administered 2024-02-26: 40 mg

## 2024-02-26 NOTE — Patient Instructions (Signed)

## 2024-02-26 NOTE — Progress Notes (Signed)
 Pain Scale   Average Pain 0 Patient advising she doesn't have pain until she starts doing daily chores and if she walks a lot she C/O pain in right leg at night with numbness and tingling to her foot.        +Driver, -BT, -Dye Allergies.

## 2024-03-03 NOTE — Procedures (Signed)
 Lumbar Epidural Steroid Injection - Interlaminar Approach with Fluoroscopic Guidance  [name]      Date of Birth: 09/18/1951 MRN: [id number] PCP: Donnie Galea, MD      Visit Date: 02/26/2024   Universal Protocol:     Consent Given By: the patient  Position: PRONE  Additional Comments: Vital signs were monitored before and after the procedure. Patient was prepped and draped in the usual sterile fashion. The correct patient, procedure, and site was verified.   Injection Procedure Details:   Procedure diagnoses: Lumbar radiculopathy [M54.16]   Meds Administered:  Meds ordered this encounter  Medications   methylPREDNISolone acetate (DEPO-MEDROL) injection 40 mg     Laterality: Right  Location/Site:  L5-S1  Needle: 3.5 in., 20 ga. Tuohy  Needle Placement: Paramedian epidural  Findings:   -Comments: Excellent flow of contrast into the epidural space.  Procedure Details: Using a paramedian approach from the side mentioned above, the region overlying the inferior lamina was localized under fluoroscopic visualization and the soft tissues overlying this structure were infiltrated with 4 ml. of 1% Lidocaine without Epinephrine. The Tuohy needle was inserted into the epidural space using a paramedian approach.   The epidural space was localized using loss of resistance along with counter oblique bi-planar fluoroscopic views.  After negative aspirate for air, blood, and CSF, a 2 ml. volume of Isovue-250 was injected into the epidural space and the flow of contrast was observed. Radiographs were obtained for documentation purposes.    The injectate was administered into the level noted above.   Additional Comments:  No complications occurred Dressing: 2 x 2 sterile gauze and Band-Aid    Post-procedure details: Patient was observed during the procedure. Post-procedure instructions were reviewed.  Patient left the clinic in stable condition.

## 2024-03-03 NOTE — Progress Notes (Signed)
 Jennifer [name] - 73 y.o. female MRN [id number]  Date of birth: [birth year]  Office Visit Note: Visit Date: 02/26/2024 PCP: Joaquim Nam, MD Referred by: Joaquim Nam, MD  Subjective: Chief Complaint  Patient presents with   Lower Back - Pain   HPI:  Jennifer [name] is a 73 y.o. female who comes in today at the request of Rexene Edison, PA-C for planned Right L5-S1 Lumbar Interlaminar epidural steroid injection with fluoroscopic guidance.  The patient has failed conservative care including home exercise, medications, time and activity modification.  This injection will be diagnostic and hopefully therapeutic.  Please see requesting physician notes for further details and justification.  She does not have much in the way of any nerve compression and I would consider facet joint block in the future.   ROS Otherwise per HPI.  Assessment & Plan: Visit Diagnoses:    ICD-10-CM   1. Lumbar radiculopathy  M54.16 XR C-ARM NO REPORT    Epidural Steroid injection    methylPREDNISolone acetate (DEPO-MEDROL) injection 40 mg      Plan: No additional findings.   Meds & Orders:  Meds ordered this encounter  Medications   methylPREDNISolone acetate (DEPO-MEDROL) injection 40 mg    Orders Placed This Encounter  Procedures   XR C-ARM NO REPORT   Epidural Steroid injection    Follow-up: Return for visit to requesting provider as needed.   Procedures: No procedures performed  Lumbar Epidural Steroid Injection - Interlaminar Approach with Fluoroscopic Guidance  [name]      Date of Birth: August 31, 1951 MRN: [id number] PCP: Joaquim Nam, MD      Visit Date: 02/26/2024   Universal Protocol:     Consent Given By: the patient  Position: PRONE  Additional Comments: Vital signs were monitored before and after the procedure. Patient was prepped and draped in the usual sterile fashion. The correct patient, procedure, and site was verified.   Injection  Procedure Details:   Procedure diagnoses: Lumbar radiculopathy [M54.16]   Meds Administered:  Meds ordered this encounter  Medications   methylPREDNISolone acetate (DEPO-MEDROL) injection 40 mg     Laterality: Right  Location/Site:  L5-S1  Needle: 3.5 in., 20 ga. Tuohy  Needle Placement: Paramedian epidural  Findings:   -Comments: Excellent flow of contrast into the epidural space.  Procedure Details: Using a paramedian approach from the side mentioned above, the region overlying the inferior lamina was localized under fluoroscopic visualization and the soft tissues overlying this structure were infiltrated with 4 ml. of 1% Lidocaine without Epinephrine. The Tuohy needle was inserted into the epidural space using a paramedian approach.   The epidural space was localized using loss of resistance along with counter oblique bi-planar fluoroscopic views.  After negative aspirate for air, blood, and CSF, a 2 ml. volume of Isovue-250 was injected into the epidural space and the flow of contrast was observed. Radiographs were obtained for documentation purposes.    The injectate was administered into the level noted above.   Additional Comments:  No complications occurred Dressing: 2 x 2 sterile gauze and Band-Aid    Post-procedure details: Patient was observed during the procedure. Post-procedure instructions were reviewed.  Patient left the clinic in stable condition.   Clinical History: CLINICAL DATA:  Spinal stenosis, lumbar R/O HNP   EXAM: MRI LUMBAR SPINE WITHOUT CONTRAST   TECHNIQUE: Multiplanar, multisequence MR imaging of the lumbar spine was performed. No intravenous contrast was administered.   COMPARISON:  None Available.   FINDINGS: Segmentation:  Standard.   Alignment:  No substantial sagittal subluxation.   Vertebrae: No evidence of acute fracture, discitis/osteomyelitis or suspicious bone lesion.   Conus medullaris and cauda equina: Conus extends  to the L1-L2 level. Conus and cauda equina appear normal.   Paraspinal and other soft tissues: Unremarkable.   Disc levels:   T12-L1: No significant disc protrusion, foraminal stenosis, or canal stenosis.   L1-L2: No significant disc protrusion, foraminal stenosis, or canal stenosis.   L2-L3: No significant disc protrusion, foraminal stenosis, or canal stenosis.   L3-L4: Mild disc bulging. Bilateral facet arthropathy. Mild bilateral foraminal stenosis. No significant canal stenosis.   L4-L5: Mild disc bulging. Moderate bilateral facet arthropathy. Resulting mild bilateral foraminal stenosis   L5-S1: No significant disc protrusion, foraminal stenosis, or canal stenosis.   IMPRESSION: 1. Mild bilateral foraminal stenosis at L3-L4 and L4-L5. 2. Moderate facet arthropathy at L4-L5. 3. No significant canal stenosis.     Electronically Signed   By: Stevenson Elbe M.D.   On: 01/07/2024 02:47     Objective:  VS:  HT:    WT:   BMI:     BP:(!) 143/61  HR:65bpm  TEMP: ( )  RESP:  Physical Exam Vitals and nursing note reviewed.  Constitutional:      General: She is not in acute distress.    Appearance: Normal appearance. She is not ill-appearing.  HENT:     Head: Normocephalic and atraumatic.     Right Ear: External ear normal.     Left Ear: External ear normal.  Eyes:     Extraocular Movements: Extraocular movements intact.  Cardiovascular:     Rate and Rhythm: Normal rate.     Pulses: Normal pulses.  Pulmonary:     Effort: Pulmonary effort is normal. No respiratory distress.  Abdominal:     General: There is no distension.     Palpations: Abdomen is soft.  Musculoskeletal:        General: Tenderness present.     Cervical back: Neck supple.     Right lower leg: No edema.     Left lower leg: No edema.     Comments: Patient has good distal strength with no pain over the greater trochanters.  No clonus or focal weakness.  Skin:    Findings: No erythema,  lesion or rash.  Neurological:     General: No focal deficit present.     Mental Status: She is alert and oriented to person, place, and time.     Sensory: No sensory deficit.     Motor: No weakness or abnormal muscle tone.     Coordination: Coordination normal.  Psychiatric:        Mood and Affect: Mood normal.        Behavior: Behavior normal.      Imaging: No results found.

## 2024-03-12 ENCOUNTER — Telehealth: Payer: Self-pay | Admitting: Physical Medicine and Rehabilitation

## 2024-03-12 NOTE — Telephone Encounter (Signed)
 Pt called stating Dr Daisey Dryer told her to call if back injection did not work. Pt asking for a call back with next plan of care. Pt phone number is [phone].

## 2024-03-16 ENCOUNTER — Ambulatory Visit (INDEPENDENT_AMBULATORY_CARE_PROVIDER_SITE_OTHER): Admitting: Physical Medicine and Rehabilitation

## 2024-03-16 ENCOUNTER — Encounter: Payer: Self-pay | Admitting: Physical Medicine and Rehabilitation

## 2024-03-16 DIAGNOSIS — M47819 Spondylosis without myelopathy or radiculopathy, site unspecified: Secondary | ICD-10-CM

## 2024-03-16 DIAGNOSIS — M47816 Spondylosis without myelopathy or radiculopathy, lumbar region: Secondary | ICD-10-CM | POA: Diagnosis not present

## 2024-03-16 DIAGNOSIS — M545 Low back pain, unspecified: Secondary | ICD-10-CM

## 2024-03-16 DIAGNOSIS — G8929 Other chronic pain: Secondary | ICD-10-CM | POA: Diagnosis not present

## 2024-03-16 NOTE — Progress Notes (Unsigned)
 Pain Scale   Average Pain 0 Patient advising she had lower back pain when she is working around house - mopping ,working in yard. Patient advising that here main issues is when standing. Patient advising that also her right leg throbs and twitches at night and it is very painful.         +Driver, -BT, -Dye Allergies.

## 2024-03-16 NOTE — Progress Notes (Unsigned)
 Core Outcome Measures Index (COMI) Back Score  Average Pain 5  COMI Score 50 %
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
## 2024-03-17 NOTE — Progress Notes (Signed)
 Cardiology Clinic Note   Date: 03/18/2024 ID: BREYANA BOWMAN, DOB 08/03/1951, MRN [id number]  Primary Cardiologist:  Belva Boyden, MD  Chief Complaint   JENNNIFER HINEBAUGH is a 73 y.o. female who presents to the clinic today for routine follow up.   Patient Profile   ROSLYNN BECKS is followed by Dr. Gollan for the history outlined below.      Past medical history significant for: Aortic atherosclerosis. Cardiac murmur. Echo 03/28/2022: EF 60 to 65%.  No RWMA.  Grade I DD.  Normal RV size/function.  Aortic valve sclerosis without stenosis. Palpitations. Hypertension. Hyperlipidemia. Lipid panel 07/10/2023: LDL 148, HDL 55, TG 144, total 232.  In summary, patient was initially evaluated by Dr. Addie Holstein on 03/14/2022 for palpitations-heart murmur.  Patient reported an episode of heart racing described as pounding and irregular that did not improve with rest.  She took a CBD pill and heart rate gradually normalized.  This all occurred in the setting of an increased amount of stress.  She reported associated chest discomfort that resolved as soon as palpitations went away.  Outpatient heart monitoring was not ordered secondary to isolated episode of palpitations without recurrence.  Echo demonstrated aortic valve sclerosis without stenosis as detailed above.  Patient was last seen in the office by Dr. Gollan on 11/19/2022 for follow-up.  She was doing well at that time and no changes were made.     History of Present Illness    Today, patient is here alone. She reports she gets an occasional "twinge" in her chest when she is anxious that has been occurring since age 66. No chest pain, pressure, or tightness. No palpitations. She reports shortness of breath and DOE that she attributes to body habitus. She used to walk regularly but has had some issues with back pain that have prevented her from doing so. She had an injection in her back that did not help  her pain and she has a plan to  have it repeated soon. 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 She does not check her BP as there is something wrong with her BP cuff.     ROS: All other systems reviewed and are otherwise negative except as noted in History of Present Illness.  EKGs/Labs Reviewed    EKG Interpretation Date/Time:  Thursday Mar 18 2024 13:45:09 EDT Ventricular Rate:  67 PR Interval:  146 QRS Duration:  80 QT Interval:  414 QTC Calculation: 437 R Axis:   -1  Text Interpretation: Normal sinus rhythm Anteroseptal infarct (cited on or before 21-Oct-2011) When compared with ECG of 12-Oct-2019 19:55, No significant change was found Confirmed by Morey Ar [id number]) on 03/18/2024 2:09:28 PM   07/10/2023: ALT 21; AST 23; BUN 11; Creatinine, Ser 0.81; Potassium 3.5; Sodium 138   07/10/2023: Hemoglobin 14.1; WBC 6.2   07/10/2023: TSH 1.44    Risk Assessment/Calculations      HYPERTENSION CONTROL Vitals:   03/18/24 1341 03/18/24 1532  BP: (!) 158/74 (!) 150/80    The patient's blood pressure is elevated above target today.  In order to address the patient's elevated BP: A current anti-hypertensive medication was adjusted today.           Physical Exam    VS:  BP (!) 150/80 (BP Location: Left Arm, Patient Position: Sitting, Cuff Size: Large)   Pulse 67   Ht 4' 11.5" (1.511 m)   Wt 228 lb (103.4 kg)   SpO2 95%   BMI 45.28 kg/m  ,  BMI Body mass index is 45.28 kg/m.  GEN: Well nourished, well developed, in no acute distress. Neck: No JVD or carotid bruits. Cardiac:  RRR. 1/6 systolic murmur. No rubs or gallops.   Respiratory:  Respirations regular and unlabored. Clear to auscultation without rales, wheezing or rhonchi. GI: Soft, nontender, nondistended. Extremities: Radials/DP/PT 2+ and equal bilaterally. No clubbing or cyanosis. No edema.  Skin: Warm and dry, no rash. Neuro: Strength intact.  Assessment & Plan   Cardiac murmur Echo May 2023 showed normal LV/RV function, Grade I DD, aortic  valve sclerosis without stenosis.  Patient denies lower extremity edema. She has chronic shortness of breath and DOE she attributes to body habitus. She has to sleep on 4 pillows secondary to the size of her breasts. This is not new for her. She was walking for exercise but had been limited recently secondary to back pain. 1/6 systolic murmur on exam.  - Increase physical activity as tolerated.  - Repeat echo as clinically indicated.  Palpitations Patient denies recent palpitations. RRR on exam today.  - Continue atenolol .  Hypertension BP today 156/74 on intake and 150/80 on my recheck. She does not check BP at home as her cuff is broken. No report of headaches or dizziness.  - Increase amlodipine  to 10 mg daily. She will contact the office if she experiences ankle edema.  - Rx for new BP cuff.  - Continue atenolol , Maxide.  Hyperlipidemia LDL 148 August 2024, not at goal.  Patient has discussed this with PCP. She is not interested in taking a statin as her mother took one and "could barely walk."  - Continue to follow with PCP.   Disposition: Increase amlodipine  to 10 mg daily. Rx BP cuff. Follow up in 6 months or sooner as needed.          Signed, Lonell Rives. Aleathea Pugmire, DNP, NP-C

## 2024-03-18 ENCOUNTER — Encounter: Payer: Self-pay | Admitting: Student

## 2024-03-18 ENCOUNTER — Ambulatory Visit: Attending: Student | Admitting: Student

## 2024-03-18 ENCOUNTER — Encounter: Payer: Self-pay | Admitting: Nurse Practitioner

## 2024-03-18 VITALS — BP 150/80 | HR 67 | Ht 59.5 in | Wt 228.0 lb

## 2024-03-18 DIAGNOSIS — R002 Palpitations: Secondary | ICD-10-CM | POA: Diagnosis not present

## 2024-03-18 DIAGNOSIS — E7800 Pure hypercholesterolemia, unspecified: Secondary | ICD-10-CM | POA: Diagnosis not present

## 2024-03-18 DIAGNOSIS — R011 Cardiac murmur, unspecified: Secondary | ICD-10-CM | POA: Diagnosis not present

## 2024-03-18 DIAGNOSIS — I10 Essential (primary) hypertension: Secondary | ICD-10-CM | POA: Insufficient documentation

## 2024-03-18 MED ORDER — BLOOD PRESSURE CUFF MISC: 0 refills | Status: AC

## 2024-03-18 MED ORDER — AMLODIPINE BESYLATE 10 MG PO TABS: 10.0000 mg | ORAL_TABLET | Freq: Every day | ORAL | 3 refills | Status: AC

## 2024-03-18 MED ORDER — BLOOD PRESSURE CUFF MISC: 0 refills | Status: DC

## 2024-03-18 NOTE — Progress Notes (Signed)
 This encounter was created in error - please disregard.

## 2024-03-18 NOTE — Patient Instructions (Signed)
 Medication Instructions:  Your physician recommends the following medication changes.  INCREASE: Amlodipine  to 10 mg one tablet per day  *If you need a refill on your cardiac medications before your next appointment, please call your pharmacy*   Follow-Up: At [facility], you and your health needs are our priority.  As part of our continuing mission to provide you with exceptional heart care, our providers are all part of one team.  This team includes your primary Cardiologist (physician) and Advanced Practice Providers or APPs (Physician Assistants and Nurse Practitioners) who all work together to provide you with the care you need, when you need it.  Your next appointment:   6 month(s)  Provider:   You may see one of the following Advanced Practice Providers on your designated Care Team:   Laneta Pintos, NP Gildardo Labrador, PA-C Varney Gentleman, PA-C Cadence Donahue, PA-C Ronald Cockayne, NP Morey Ar, NP    We recommend signing up for the patient portal called "MyChart".  Sign up information is provided on this After Visit Summary.  MyChart is used to connect with patients for Virtual Visits (Telemedicine).  Patients are able to view lab/test results, encounter notes, upcoming appointments, etc.  Non-urgent messages can be sent to your provider as well.   To learn more about what you can do with MyChart, go to [email or web address].   Other Instructions  Blood Pressure Cuff prescription has been written.

## 2024-04-01 ENCOUNTER — Other Ambulatory Visit: Payer: Self-pay

## 2024-04-01 ENCOUNTER — Ambulatory Visit (INDEPENDENT_AMBULATORY_CARE_PROVIDER_SITE_OTHER): Admitting: Physical Medicine and Rehabilitation

## 2024-04-01 VITALS — BP 187/75 | HR 67

## 2024-04-01 DIAGNOSIS — M47816 Spondylosis without myelopathy or radiculopathy, lumbar region: Secondary | ICD-10-CM

## 2024-04-01 DIAGNOSIS — M47819 Spondylosis without myelopathy or radiculopathy, site unspecified: Secondary | ICD-10-CM

## 2024-04-01 MED ORDER — METHYLPREDNISOLONE ACETATE 40 MG/ML IJ SUSP
40.0000 mg | Freq: Once | INTRAMUSCULAR | Status: AC
Start: 2024-04-01 — End: 2024-04-01
  Administered 2024-04-01: 40 mg

## 2024-04-01 NOTE — Progress Notes (Signed)
 Pain Scale   Average Pain 8 Patient advising her pain increases in her lower back when doing household chores.        +Driver, -BT, -Dye Allergies.

## 2024-04-01 NOTE — Patient Instructions (Signed)

## 2024-04-14 NOTE — Progress Notes (Signed)
 Jennifer [name] - 73 y.o. female MRN [id number]  Date of birth: 19-Apr-1951  Office Visit Note: Visit Date: 04/01/2024 PCP: Donnie Galea, MD Referred by: Donnie Galea, MD  Subjective: Chief Complaint  Patient presents with   Lower Back - Pain   HPI:  Jennifer [name] is a 73 y.o. female who comes in today at the request of Elvan Hamel, FNP for planned Bilateral  L4-5 Lumbar facet/medial branch block with fluoroscopic guidance.  The patient has failed conservative care including home exercise, medications, time and activity modification.  This injection will be diagnostic and hopefully therapeutic.  Please see requesting physician notes for further details and justification.  Exam has shown concordant pain with facet joint loading.   ROS Otherwise per HPI.  Assessment & Plan: Visit Diagnoses:    ICD-10-CM   1. 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   Meds & Orders:  Meds ordered this encounter  Medications   methylPREDNISolone  acetate (DEPO-MEDROL ) injection 40 mg    Orders Placed This Encounter  Procedures   Facet Injection   XR C-ARM NO REPORT    Follow-up: Return for visit to requesting provider as needed.   Procedures: No procedures performed  Lumbar Facet Joint Intra-Articular Injection(s) with Fluoroscopic Guidance  [name]      Date of Birth: 12-01-1950 MRN: [id number] PCP: Donnie Galea, MD      Visit Date: 04/01/2024   Universal Protocol:    Date/Time: 04/01/2024  Consent Given By: the patient  Position: PRONE   Additional Comments: Vital signs were monitored before and after the procedure. Patient was prepped and draped in the usual sterile fashion. The correct patient, procedure, and site was verified.   Injection Procedure Details:  Procedure Site One Meds  Administered:  Meds ordered this encounter  Medications   methylPREDNISolone  acetate (DEPO-MEDROL ) injection 40 mg     Laterality: Bilateral  Location/Site:  L4-L5  Needle size: 22 guage  Needle type: Spinal  Needle Placement: Articular  Findings:  -Comments: Excellent flow of contrast producing a partial arthrogram.  Procedure Details: The fluoroscope beam is vertically oriented in AP, and the inferior recess is visualized beneath the lower pole of the inferior apophyseal process, which represents the target point for needle insertion. When direct visualization is difficult the target point is located at the medial projection of the vertebral pedicle. The region overlying each aforementioned target is locally anesthetized with a 1 to 2 ml. volume of 1% Lidocaine  without Epinephrine .   The spinal needle was inserted into each of the above mentioned facet joints using biplanar fluoroscopic guidance. A 0.25 to 0.5 ml. volume of Isovue-250 was injected and a partial facet joint arthrogram was obtained. A single spot film was obtained of the resulting arthrogram.    One to 1.25 ml of the steroid/anesthetic solution was then injected into each of the facet joints noted above.   Additional Comments:  No complications occurred Dressing: 2 x 2 sterile gauze and Band-Aid    Post-procedure details: Patient was observed during the procedure. Post-procedure instructions were reviewed.  Patient left the clinic in stable condition.    Clinical History: CLINICAL DATA:  Spinal stenosis, lumbar R/O HNP   EXAM: MRI LUMBAR SPINE WITHOUT CONTRAST   TECHNIQUE: Multiplanar, multisequence MR imaging of the lumbar spine was  performed. No intravenous contrast was administered.   COMPARISON:  None Available.   FINDINGS: Segmentation:  Standard.   Alignment:  No substantial sagittal subluxation.   Vertebrae: No evidence of acute fracture, discitis/osteomyelitis or suspicious bone  lesion.   Conus medullaris and cauda equina: Conus extends to the L1-L2 level. Conus and cauda equina appear normal.   Paraspinal and other soft tissues: Unremarkable.   Disc levels:   T12-L1: No significant disc protrusion, foraminal stenosis, or canal stenosis.   L1-L2: No significant disc protrusion, foraminal stenosis, or canal stenosis.   L2-L3: No significant disc protrusion, foraminal stenosis, or canal stenosis.   L3-L4: Mild disc bulging. Bilateral facet arthropathy. Mild bilateral foraminal stenosis. No significant canal stenosis.   L4-L5: Mild disc bulging. Moderate bilateral facet arthropathy. Resulting mild bilateral foraminal stenosis   L5-S1: No significant disc protrusion, foraminal stenosis, or canal stenosis.   IMPRESSION: 1. Mild bilateral foraminal stenosis at L3-L4 and L4-L5. 2. Moderate facet arthropathy at L4-L5. 3. No significant canal stenosis.     Electronically Signed   By: Stevenson Elbe M.D.   On: 01/07/2024 02:47     Objective:  VS:  HT:    WT:   BMI:     BP:(!) 187/75  HR:67bpm  TEMP: ( )  RESP:  Physical Exam Vitals and nursing note reviewed.  Constitutional:      General: She is not in acute distress.    Appearance: Normal appearance. She is not ill-appearing.  HENT:     Head: Normocephalic and atraumatic.     Right Ear: External ear normal.     Left Ear: External ear normal.  Eyes:     Extraocular Movements: Extraocular movements intact.  Cardiovascular:     Rate and Rhythm: Normal rate.     Pulses: Normal pulses.  Pulmonary:     Effort: Pulmonary effort is normal. No respiratory distress.  Abdominal:     General: There is no distension.     Palpations: Abdomen is soft.  Musculoskeletal:        General: Tenderness present.     Cervical back: Neck supple.     Right lower leg: No edema.     Left lower leg: No edema.     Comments: Patient has good distal strength with no pain over the greater trochanters.  No  clonus or focal weakness.  Skin:    Findings: No erythema, lesion or rash.  Neurological:     General: No focal deficit present.     Mental Status: She is alert and oriented to person, place, and time.     Sensory: No sensory deficit.     Motor: No weakness or abnormal muscle tone.     Coordination: Coordination normal.  Psychiatric:        Mood and Affect: Mood normal.        Behavior: Behavior normal.      Imaging: No results found.

## 2024-04-14 NOTE — Procedures (Signed)
 Lumbar Facet Joint Intra-Articular Injection(s) with Fluoroscopic Guidance  [name]      Date of Birth: [birth year] MRN: [id number] PCP: Donnie Galea, MD      Visit Date: 04/01/2024   Universal Protocol:    Date/Time: 04/01/2024  Consent Given By: the patient  Position: PRONE   Additional Comments: Vital signs were monitored before and after the procedure. Patient was prepped and draped in the usual sterile fashion. The correct patient, procedure, and site was verified.   Injection Procedure Details:  Procedure Site One Meds Administered:  Meds ordered this encounter  Medications   methylPREDNISolone  acetate (DEPO-MEDROL ) injection 40 mg     Laterality: Bilateral  Location/Site:  L4-L5  Needle size: 22 guage  Needle type: Spinal  Needle Placement: Articular  Findings:  -Comments: Excellent flow of contrast producing a partial arthrogram.  Procedure Details: The fluoroscope beam is vertically oriented in AP, and the inferior recess is visualized beneath the lower pole of the inferior apophyseal process, which represents the target point for needle insertion. When direct visualization is difficult the target point is located at the medial projection of the vertebral pedicle. The region overlying each aforementioned target is locally anesthetized with a 1 to 2 ml. volume of 1% Lidocaine  without Epinephrine .   The spinal needle was inserted into each of the above mentioned facet joints using biplanar fluoroscopic guidance. A 0.25 to 0.5 ml. volume of Isovue-250 was injected and a partial facet joint arthrogram was obtained. A single spot film was obtained of the resulting arthrogram.    One to 1.25 ml of the steroid/anesthetic solution was then injected into each of the facet joints noted above.   Additional Comments:  No complications occurred Dressing: 2 x 2 sterile gauze and Band-Aid    Post-procedure details: Patient was observed during the  procedure. Post-procedure instructions were reviewed.  Patient left the clinic in stable condition.

## 2024-04-20 ENCOUNTER — Telehealth: Payer: Self-pay | Admitting: Physical Medicine and Rehabilitation

## 2024-04-20 NOTE — Telephone Encounter (Signed)
 Patient called and said the two injections her back is still hurting. Even after standing too long. She said the tingling from her right thigh all the way to her knee will not stop. CB#[id number]

## 2024-04-21 ENCOUNTER — Telehealth: Payer: Self-pay

## 2024-04-21 NOTE — Telephone Encounter (Signed)
 Jennifer [name]

## 2024-05-04 ENCOUNTER — Encounter: Payer: Self-pay | Admitting: Physical Medicine and Rehabilitation

## 2024-05-04 ENCOUNTER — Ambulatory Visit (INDEPENDENT_AMBULATORY_CARE_PROVIDER_SITE_OTHER): Admitting: Physical Medicine and Rehabilitation

## 2024-05-04 DIAGNOSIS — M545 Low back pain, unspecified: Secondary | ICD-10-CM

## 2024-05-04 DIAGNOSIS — M47816 Spondylosis without myelopathy or radiculopathy, lumbar region: Secondary | ICD-10-CM | POA: Diagnosis not present

## 2024-05-04 DIAGNOSIS — G8929 Other chronic pain: Secondary | ICD-10-CM | POA: Diagnosis not present

## 2024-05-04 NOTE — Progress Notes (Signed)
 Pain Scale   Average Pain 7 Patient advising her lower back injection she received 04/01/2024 did not help her pain. Patient advising she is unable to do daily chores.        +Driver, -BT, -Dye Allergies.
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
## 2024-05-14 DIAGNOSIS — M898X9 Other specified disorders of bone, unspecified site: Secondary | ICD-10-CM | POA: Diagnosis not present

## 2024-05-14 DIAGNOSIS — M79674 Pain in right toe(s): Secondary | ICD-10-CM | POA: Diagnosis not present

## 2024-05-14 DIAGNOSIS — B351 Tinea unguium: Secondary | ICD-10-CM | POA: Diagnosis not present

## 2024-05-14 DIAGNOSIS — M79675 Pain in left toe(s): Secondary | ICD-10-CM | POA: Diagnosis not present

## 2024-05-14 DIAGNOSIS — L600 Ingrowing nail: Secondary | ICD-10-CM | POA: Diagnosis not present

## 2024-06-02 DIAGNOSIS — B079 Viral wart, unspecified: Secondary | ICD-10-CM | POA: Diagnosis not present

## 2024-06-18 ENCOUNTER — Other Ambulatory Visit: Payer: Self-pay | Admitting: Family Medicine

## 2024-06-18 DIAGNOSIS — G2581 Restless legs syndrome: Secondary | ICD-10-CM

## 2024-06-18 NOTE — Telephone Encounter (Signed)
Patient due for appt; please call to schedule

## 2024-06-21 NOTE — Telephone Encounter (Signed)
 Spoke to significant other. States patient will call back to schedule

## 2024-06-23 ENCOUNTER — Other Ambulatory Visit: Payer: Self-pay | Admitting: Family Medicine

## 2024-06-23 DIAGNOSIS — I10 Essential (primary) hypertension: Secondary | ICD-10-CM

## 2024-06-23 NOTE — Telephone Encounter (Signed)
 Copied from CRM #[id number]. Topic: Clinical - Medication Refill >> Jun 23, 2024  4:16 PM Jennifer [name] wrote: Medication: triamterene -hydrochlorothiazide  (MAXZIDE -25) 37.5-25 MG tablet - 90 day supply  Has the patient contacted their pharmacy? Yes  This is the patient's preferred pharmacy:  [name] [place], [place] - [address] [place] [address] Phone: [phone] Fax: [phone]  Is this the correct pharmacy for this prescription? Yes  Has the prescription been filled recently? No  Is the patient out of the medication? No  Has the patient been seen for an appointment in the last year OR does the patient have an upcoming appointment? Yes  Can we respond through MyChart? Yes  Agent: Please be advised that Rx refills may take up to 3 business days. We ask that you follow-up with your pharmacy.

## 2024-06-25 MED ORDER — TRIAMTERENE-HCTZ 37.5-25 MG PO TABS
1.0000 | ORAL_TABLET | Freq: Every day | ORAL | 0 refills | Status: DC
Start: 2024-06-25 — End: 2024-09-23

## 2024-06-25 NOTE — Telephone Encounter (Signed)
 Pt needs appt with Dr Cleatus for yearly follow-up.

## 2024-06-28 NOTE — Telephone Encounter (Signed)
 Spoke to pt, pt states she will have to cb to sch at a later time. Pt states due to her husband & mother current health issues/upcoming appts, she's afraid to commit to anything just to cancel.

## 2024-06-28 NOTE — Telephone Encounter (Signed)
 FYI

## 2024-06-29 NOTE — Telephone Encounter (Signed)
 Noted and agreed.  Thanks.

## 2024-07-15 DIAGNOSIS — Z6841 Body Mass Index (BMI) 40.0 and over, adult: Secondary | ICD-10-CM | POA: Diagnosis not present

## 2024-07-15 DIAGNOSIS — M4316 Spondylolisthesis, lumbar region: Secondary | ICD-10-CM | POA: Diagnosis not present

## 2024-07-20 ENCOUNTER — Ambulatory Visit (INDEPENDENT_AMBULATORY_CARE_PROVIDER_SITE_OTHER): Payer: Medicare Other

## 2024-07-20 ENCOUNTER — Other Ambulatory Visit: Payer: Self-pay | Admitting: Family Medicine

## 2024-07-20 VITALS — Ht 67.0 in | Wt 228.0 lb

## 2024-07-20 DIAGNOSIS — I10 Essential (primary) hypertension: Secondary | ICD-10-CM

## 2024-07-20 DIAGNOSIS — Z0000 Encounter for general adult medical examination without abnormal findings: Secondary | ICD-10-CM
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
## 2024-07-20 NOTE — Patient Instructions (Signed)
 Ms. Urton , Thank you for taking time out of your busy schedule to complete your Annual Wellness Visit with me. I enjoyed our conversation and look forward to speaking with you again next year. I, as well as your care team,  appreciate your ongoing commitment to your health goals. Please review the following plan we discussed and let me know if I can assist you in the future. Your Game plan/ To Do List    Referrals: If you haven't heard from the office you've been referred to, please reach out to them at the phone provided.   Follow up Visits: We will see or speak with you next year for your Next Medicare AWV with our clinical staff Have you seen your provider in the last 6 months (3 months if uncontrolled diabetes)? No  Clinician Recommendations:  Aim for 30 minutes of exercise or brisk walking, 6-8 glasses of water, and 5 servings of fruits and vegetables each day.       This is a list of the screenings recommended for you:  Health Maintenance  Topic Date Due   DTaP/Tdap/Td vaccine (1 - Tdap) Never done   Pneumococcal Vaccine for age over 63 (1 of 2 - PCV) Never done   Zoster (Shingles) Vaccine (1 of 2) Never done   Eye exam for diabetics  07/17/2021   Complete foot exam   09/27/2021   Yearly kidney health urinalysis for diabetes  04/27/2023   Hemoglobin A1C  01/10/2024   Cologuard (Stool DNA test)  02/18/2024   Flu Shot  06/18/2024   Yearly kidney function blood test for diabetes  07/09/2024   COVID-19 Vaccine (4 - 2025-26 season) 07/19/2024   Medicare Annual Wellness Visit  07/20/2025   Mammogram  08/27/2025   DEXA scan (bone density measurement)  Completed   Hepatitis C Screening  Completed   HPV Vaccine  Aged Out   Meningitis B Vaccine  Aged Out    Advanced directives: (Copy Requested) Please bring a copy of your health care power of attorney and living will to the office to be added to your chart at your convenience. You can mail to [facility] [address]  [address] [place], [place] [address] or email to [email or web address] Advance Care Planning is important because it:  [x]  Makes sure you receive the medical care that is consistent with your values, goals, and preferences  [x]  It provides guidance to your family and loved ones and reduces their decisional burden about whether or not they are making the right decisions based on your wishes.  Follow the link provided in your after visit summary or read over the paperwork we have mailed to you to help you started getting your Advance Directives in place. If you need assistance in completing these, please reach out to us  so that we can help you!

## 2024-07-20 NOTE — Telephone Encounter (Signed)
 Patient is due for yearly visit with Dr. Cleatus.  Please call and schedule

## 2024-07-21 NOTE — Telephone Encounter (Signed)
 I called and patient stated she has an upcoming appointment scheduled of next week and would like to schedule in person for her yearly visit.

## 2024-07-21 NOTE — Telephone Encounter (Signed)
 Noted. Thanks.

## 2024-07-27 ENCOUNTER — Ambulatory Visit (INDEPENDENT_AMBULATORY_CARE_PROVIDER_SITE_OTHER): Admitting: Family Medicine

## 2024-07-27 ENCOUNTER — Encounter: Payer: Self-pay | Admitting: Family Medicine

## 2024-07-27 VITALS — BP 140/80 | HR 74 | Temp 98.2°F | Ht 59.0 in | Wt 225.8 lb

## 2024-07-27 DIAGNOSIS — M549 Dorsalgia, unspecified: Secondary | ICD-10-CM | POA: Diagnosis not present

## 2024-07-27 DIAGNOSIS — G2581 Restless legs syndrome: Secondary | ICD-10-CM | POA: Diagnosis not present

## 2024-07-27 DIAGNOSIS — E559 Vitamin D deficiency, unspecified: Secondary | ICD-10-CM

## 2024-07-27 DIAGNOSIS — L649 Androgenic alopecia, unspecified: Secondary | ICD-10-CM

## 2024-07-27 DIAGNOSIS — E119 Type 2 diabetes mellitus without complications: Secondary | ICD-10-CM | POA: Diagnosis not present

## 2024-07-27 DIAGNOSIS — Z7189 Other specified counseling: Secondary | ICD-10-CM | POA: Diagnosis not present

## 2024-07-27 DIAGNOSIS — I10 Essential (primary) hypertension: Secondary | ICD-10-CM

## 2024-07-27 DIAGNOSIS — Z0000 Encounter for general adult medical examination without abnormal findings: Secondary | ICD-10-CM

## 2024-07-27 DIAGNOSIS — B351 Tinea unguium: Secondary | ICD-10-CM | POA: Diagnosis not present

## 2024-07-27 LAB — CBC WITH DIFFERENTIAL/PLATELET
Basophils Absolute: 0 K/uL (ref 0.0–0.1)
Basophils Relative: 0.5 % (ref 0.0–3.0)
Eosinophils Absolute: 0.2 K/uL (ref 0.0–0.7)
Eosinophils Relative: 2.7 % (ref 0.0–5.0)
HCT: 41.8 % (ref 36.0–46.0)
Hemoglobin: 14 g/dL (ref 12.0–15.0)
Lymphocytes Relative: 19.2 % (ref 12.0–46.0)
Lymphs Abs: 1.1 K/uL (ref 0.7–4.0)
MCHC: 33.6 g/dL (ref 30.0–36.0)
MCV: 91.7 fl (ref 78.0–100.0)
Monocytes Absolute: 0.7 K/uL (ref 0.1–1.0)
Monocytes Relative: 12.1 % — ABNORMAL HIGH (ref 3.0–12.0)
Neutro Abs: 3.8 K/uL (ref 1.4–7.7)
Neutrophils Relative %: 65.5 % (ref 43.0–77.0)
Platelets: 227 K/uL (ref 150.0–400.0)
RBC: 4.56 Mil/uL (ref 3.87–5.11)
RDW: 13.6 % (ref 11.5–15.5)
WBC: 5.8 K/uL (ref 4.0–10.5)

## 2024-07-27 LAB — COMPREHENSIVE METABOLIC PANEL WITH GFR
ALT: 28 U/L (ref 0–35)
AST: 25 U/L (ref 0–37)
Albumin: 4.3 g/dL (ref 3.5–5.2)
Alkaline Phosphatase: 50 U/L (ref 39–117)
BUN: 11 mg/dL (ref 6–23)
CO2: 35 meq/L — ABNORMAL HIGH (ref 19–32)
Calcium: 9.8 mg/dL (ref 8.4–10.5)
Chloride: 95 meq/L — ABNORMAL LOW (ref 96–112)
Creatinine, Ser: 0.76 mg/dL (ref 0.40–1.20)
GFR: 77.99 mL/min (ref >60.00)
Glucose, Bld: 133 mg/dL — ABNORMAL HIGH (ref 70–99)
Potassium: 3.3 meq/L — ABNORMAL LOW (ref 3.5–5.1)
Sodium: 138 meq/L (ref 135–145)
Total Bilirubin: 0.4 mg/dL (ref 0.2–1.2)
Total Protein: 7.7 g/dL (ref 6.0–8.3)

## 2024-07-27 LAB — LIPID PANEL
Cholesterol: 202 mg/dL — ABNORMAL HIGH (ref 0–200)
HDL: 56.1 mg/dL (ref >39.00)
LDL Cholesterol: 106 mg/dL — ABNORMAL HIGH (ref 0–99)
NonHDL: 145.57
Total CHOL/HDL Ratio: 4
Triglycerides: 196 mg/dL — ABNORMAL HIGH (ref 0.0–149.0)
VLDL: 39.2 mg/dL (ref 0.0–40.0)

## 2024-07-27 LAB — VITAMIN B12: Vitamin B-12: 252 pg/mL (ref 211–911)

## 2024-07-27 LAB — VITAMIN D 25 HYDROXY (VIT D DEFICIENCY, FRACTURES): VITD: 39.91 ng/mL (ref 30.00–100.00)

## 2024-07-27 LAB — MICROALBUMIN / CREATININE URINE RATIO
Creatinine,U: 13 mg/dL
Microalb Creat Ratio: UNDETERMINED mg/g (ref 0.0–30.0)
Microalb, Ur: <0.7 mg/dL

## 2024-07-27 LAB — TSH: TSH: 1.16 u[IU]/mL (ref 0.35–5.50)

## 2024-07-27 LAB — HEMOGLOBIN A1C: Hgb A1c MFr Bld: 7.3 % — ABNORMAL HIGH (ref 4.6–6.5)

## 2024-07-27 NOTE — Progress Notes (Unsigned)
 Hypertension:    Using medication without problems or lightheadedness: yes Chest pain with exertion: no Edema: no Short of breath: exertion limited by back pain.    RLS sx d/w pt.  She is taking requip  but still having sx.  No ADE on med.    Tetanus d/w pt.  Flu due this fall.   Covid prev done.  See AVS re: vaccines.  Advance directive discussed with patient.  Husband designated if patient were incapacitated. mammogram 2024 DXA 2020.    Deferred some health maintenance with upcoming back injection noted.   Diabetes:  No meds.   Hypoglycemic episodes: no  Hyperglycemic episodes: no Feet problems: no Blood Sugars averaging: not checked.  eye exam within last year: yes Labs pending.   She had PT for her back, along with prev injections w/o relief.  More R leg sx later in the afternoon.  D/w pt about prev neurosurgery eval.   PMH and SH reviewed  Meds, vitals, and allergies reviewed.   ROS: Per HPI unless specifically indicated in ROS section   GEN: nad, alert and oriented HEENT: ncat NECK: supple w/o LA CV: rrr. PULM: ctab, no inc wob ABD: soft, +bs EXT: no edema SKIN: Well-perfused  Diabetic foot exam: Normal inspection No skin breakdown Calluses noted on the B feet w/o ulceration.  Normal DP pulses Normal sensation to light touch and monofilament Nails thickened.   41 minutes were devoted to patient care in this encounter (this includes time spent reviewing the patient's file/history, interviewing and examining the patient, counseling/reviewing plan with patient).

## 2024-07-27 NOTE — Patient Instructions (Addendum)
 Go to the lab on the way out.   If you have mychart we'll likely use that to update you.    Take care.  Glad to see you. Let me know if you can't get set up with the podiatry clinic.   Let me see about options with ozempic  and or RLS.

## 2024-07-28 ENCOUNTER — Other Ambulatory Visit: Payer: Self-pay | Admitting: Family Medicine

## 2024-07-28 ENCOUNTER — Ambulatory Visit: Payer: Self-pay | Admitting: Family Medicine

## 2024-07-28 DIAGNOSIS — M549 Dorsalgia, unspecified: Secondary | ICD-10-CM | POA: Insufficient documentation

## 2024-07-28 DIAGNOSIS — G2581 Restless legs syndrome: Secondary | ICD-10-CM

## 2024-07-28 MED ORDER — OZEMPIC (0.25 OR 0.5 MG/DOSE) 2 MG/3ML ~~LOC~~ SOPN: 0.2500 mg | PEN_INJECTOR | SUBCUTANEOUS | 3 refills | Status: DC

## 2024-07-28 MED ORDER — ROPINIROLE HCL 1 MG PO TABS
1.0000 mg | ORAL_TABLET | Freq: Every day | ORAL | 3 refills | Status: AC
Start: 2024-07-28 — End: ?

## 2024-07-28 NOTE — Assessment & Plan Note (Signed)
 See notes on labs.  We may need to change her Requip  dose.  I want to see her labs first.

## 2024-07-28 NOTE — Assessment & Plan Note (Signed)
Advance directive discussed with patient.  Husband designated if patient were incapacitated. 

## 2024-07-28 NOTE — Assessment & Plan Note (Signed)
 Continue amlodipine  atenolol  triamterene  hydrochlorothiazide .  See notes on labs.

## 2024-07-28 NOTE — Assessment & Plan Note (Addendum)
 Tetanus d/w pt.  Flu due this fall.   Covid prev done.  See AVS re: vaccines.  Advance directive discussed with patient.  Husband designated if patient were incapacitated. mammogram 2024 DXA 2020.    Deferred some health maintenance with upcoming back injection noted.

## 2024-07-28 NOTE — Assessment & Plan Note (Signed)
 See notes on labs.  Discussed goals for weight loss related to diabetes, potential intervention for her back.  Discussed potentially using Ozempic .  Routine cautions given to patient.  See notes on labs.

## 2024-07-28 NOTE — Assessment & Plan Note (Signed)
 She had PT for her back, along with prev injections w/o relief.  More R leg sx later in the afternoon.  D/w pt about prev neurosurgery eval.

## 2024-07-29 ENCOUNTER — Other Ambulatory Visit (HOSPITAL_COMMUNITY): Payer: Self-pay

## 2024-07-29 DIAGNOSIS — M5416 Radiculopathy, lumbar region: Secondary | ICD-10-CM | POA: Diagnosis not present

## 2024-07-29 DIAGNOSIS — M5116 Intervertebral disc disorders with radiculopathy, lumbar region: Secondary | ICD-10-CM | POA: Diagnosis not present

## 2024-07-29 NOTE — Telephone Encounter (Signed)
 Pharmacy Patient Advocate Encounter   Received notification from Onbase that prior authorization for Ozempic  2 is required/requested.   Insurance verification completed.   The patient is insured through [employer] .   Per test claim: PA required; PA submitted to above mentioned insurance via Latent Key/confirmation #/EOC [id number] Status is pending

## 2024-07-30 ENCOUNTER — Other Ambulatory Visit (HOSPITAL_COMMUNITY): Payer: Self-pay

## 2024-07-30 ENCOUNTER — Telehealth: Payer: Self-pay | Admitting: Family Medicine

## 2024-07-30 NOTE — Telephone Encounter (Signed)
 Are we able to send a three month supply.

## 2024-07-30 NOTE — Telephone Encounter (Signed)
 Pharmacy Patient Advocate Encounter  Received notification from [facility] that Prior Authorization for Ozempic  2 has been DENIED.  Full denial letter will be uploaded to the media tab. See denial reason below.     PA #/Case ID/Reference #: # [id number]

## 2024-07-30 NOTE — Telephone Encounter (Signed)
 Copied from CRM #[id number]. Topic: Clinical - Medication Question >> Jul 30, 2024 11:12 AM Berneda FALCON wrote: Reason for CRM: Pt states that she is going to be on the Semaglutide ,0.25 or 0.5MG /DOS, (OZEMPIC , 0.25 OR 0.5 MG/DOSE,) 2 MG/3ML SOPN  She would like it noted that she would like a 51-month supply as it is the same cost as the 75-month supply.  [facility] - [place], [place] - [address] [place] [address] Phone: [phone] Fax: [phone] Hours: Not open 24 hours

## 2024-08-01 MED ORDER — OZEMPIC (0.25 OR 0.5 MG/DOSE) 2 MG/3ML ~~LOC~~ SOPN: 0.2500 mg | PEN_INJECTOR | SUBCUTANEOUS | 1 refills | Status: DC

## 2024-08-01 NOTE — Telephone Encounter (Signed)
 Sent. Thanks.

## 2024-08-01 NOTE — Addendum Note (Signed)
 Addended by: CLEATUS ARLYSS RAMAN on: 08/01/2024 02:51 PM   Modules accepted: Orders

## 2024-08-03 ENCOUNTER — Telehealth: Payer: Self-pay

## 2024-08-03 NOTE — Telephone Encounter (Signed)
 Copied from CRM [id number]. Topic: Clinical - Prescription Issue >> Aug 02, 2024  5:02 PM Kevelyn M wrote: Reason for CRM: Patient calling in frustrated because [facility] says their insurance will not pay for this prescription unless she is prescribed metformin first. Please advise .  Call back # [phone]

## 2024-08-03 NOTE — Telephone Encounter (Signed)
 I'll defer to her.  It doesn't appear that ozempic  will get approved w/o trial of metformin.

## 2024-08-03 NOTE — Telephone Encounter (Signed)
 Please see result note, see if she is willing to try metformin and let me know.  Thanks.

## 2024-08-03 NOTE — Progress Notes (Signed)
 Pt advised.

## 2024-08-03 NOTE — Telephone Encounter (Signed)
 Spoke with pt and she does not really want to try Metformin. She was wanting to try Ozempic  to help with weight loss. So she could have surgery on her back. She states her orthopedic Dr. Wrote a letter stating why she needs to try Ozempic .

## 2024-08-05 NOTE — Telephone Encounter (Signed)
 Patient notified. She will reach out to her insurance company to see what else she can do.

## 2024-08-09 ENCOUNTER — Ambulatory Visit (INDEPENDENT_AMBULATORY_CARE_PROVIDER_SITE_OTHER): Payer: Self-pay | Admitting: Podiatry

## 2024-08-09 DIAGNOSIS — B353 Tinea pedis: Secondary | ICD-10-CM | POA: Diagnosis not present

## 2024-08-09 DIAGNOSIS — B351 Tinea unguium: Secondary | ICD-10-CM | POA: Diagnosis not present

## 2024-08-09 DIAGNOSIS — L603 Nail dystrophy: Secondary | ICD-10-CM

## 2024-08-09 DIAGNOSIS — L03031 Cellulitis of right toe: Secondary | ICD-10-CM | POA: Diagnosis not present
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
## 2024-08-13 ENCOUNTER — Telehealth: Payer: Self-pay

## 2024-08-13 NOTE — Telephone Encounter (Signed)
 Copied from CRM [id number]. Topic: Clinical - Prescription Issue >> Aug 12, 2024  1:24 PM Paige D wrote: Reason for CRM: Pt went to spine Dr. He said because of pt weight he can't do surgery... very important that she gets on something and says her insurance wont cover it until she takes metformin first. Pt states she has no help to get this issue taken care of. Pt states if she can get Zepound or some other medication that will work. But pt states weight loss is more important to her than sugar level as if she keeps weight off sugar and everything is fine.  Dr. Colon spinal Dr. Jerel over more notes in regards to pt via fax  Pt very upset and crying tried to get pt over to [facility] she stated she didn't want to talk on the the phone any more

## 2024-08-15 NOTE — Telephone Encounter (Signed)
 I am sympathetic to her situation.  My understanding is that her insurance is the main issue here, requiring a trial of metformin before she can get coverage on other medications.    Given the situation, I think it makes sense to try metformin and then try the secondary medications after that.  Let me know if she is willing to give metformin a try.

## 2024-08-16 NOTE — Telephone Encounter (Signed)
 Noted. I'll defer to patient.

## 2024-08-16 NOTE — Telephone Encounter (Signed)
 Reached out to patient and advised of notes. She states that if you can not do anything, then there is no point in talking. Patient is declining metformin

## 2024-08-18 DIAGNOSIS — Z23 Encounter for immunization: Secondary | ICD-10-CM | POA: Diagnosis not present

## 2024-08-19 DIAGNOSIS — G4730 Sleep apnea, unspecified: Secondary | ICD-10-CM | POA: Diagnosis not present

## 2024-08-19 DIAGNOSIS — K449 Diaphragmatic hernia without obstruction or gangrene: Secondary | ICD-10-CM | POA: Diagnosis not present

## 2024-08-19 DIAGNOSIS — E1169 Type 2 diabetes mellitus with other specified complication: Secondary | ICD-10-CM | POA: Diagnosis not present

## 2024-08-19 DIAGNOSIS — Z1321 Encounter for screening for nutritional disorder: Secondary | ICD-10-CM | POA: Diagnosis not present

## 2024-08-19 DIAGNOSIS — Z79899 Other long term (current) drug therapy: Secondary | ICD-10-CM | POA: Diagnosis not present

## 2024-08-19 DIAGNOSIS — L309 Dermatitis, unspecified: Secondary | ICD-10-CM | POA: Diagnosis not present

## 2024-08-19 DIAGNOSIS — I700 Atherosclerosis of aorta: Secondary | ICD-10-CM | POA: Diagnosis not present

## 2024-08-19 DIAGNOSIS — Z1159 Encounter for screening for other viral diseases: Secondary | ICD-10-CM | POA: Diagnosis not present

## 2024-08-19 DIAGNOSIS — G2581 Restless legs syndrome: Secondary | ICD-10-CM | POA: Diagnosis not present

## 2024-08-19 DIAGNOSIS — I10 Essential (primary) hypertension: Secondary | ICD-10-CM | POA: Diagnosis not present

## 2024-08-19 DIAGNOSIS — K219 Gastro-esophageal reflux disease without esophagitis: Secondary | ICD-10-CM | POA: Diagnosis not present

## 2024-09-02 DIAGNOSIS — Z1231 Encounter for screening mammogram for malignant neoplasm of breast: Secondary | ICD-10-CM | POA: Diagnosis not present

## 2024-09-06 ENCOUNTER — Ambulatory Visit: Admitting: Podiatry

## 2024-09-06 DIAGNOSIS — E118 Type 2 diabetes mellitus with unspecified complications: Secondary | ICD-10-CM | POA: Diagnosis not present

## 2024-09-06 DIAGNOSIS — Z0001 Encounter for general adult medical examination with abnormal findings: Secondary | ICD-10-CM | POA: Diagnosis not present

## 2024-09-06 DIAGNOSIS — Z0000 Encounter for general adult medical examination without abnormal findings: Secondary | ICD-10-CM | POA: Diagnosis not present

## 2024-09-06 DIAGNOSIS — G2581 Restless legs syndrome: Secondary | ICD-10-CM | POA: Diagnosis not present

## 2024-09-06 DIAGNOSIS — K219 Gastro-esophageal reflux disease without esophagitis: Secondary | ICD-10-CM | POA: Diagnosis not present

## 2024-09-06 DIAGNOSIS — Z7984 Long term (current) use of oral hypoglycemic drugs: Secondary | ICD-10-CM | POA: Diagnosis not present

## 2024-09-06 DIAGNOSIS — G4730 Sleep apnea, unspecified: Secondary | ICD-10-CM | POA: Diagnosis not present

## 2024-09-06 DIAGNOSIS — I10 Essential (primary) hypertension: Secondary | ICD-10-CM | POA: Diagnosis not present

## 2024-09-06 DIAGNOSIS — E785 Hyperlipidemia, unspecified: Secondary | ICD-10-CM | POA: Diagnosis not present

## 2024-09-11 NOTE — Progress Notes (Unsigned)
 Cardiology Clinic Note   Date: 09/16/2024 ID: [name], [name] 07-08-1951, MRN [id number]  Primary Cardiologist:  Evalene Lunger, MD  Chief Complaint   Jennifer [name] is a 73 y.o. female who presents to the clinic today for routine follow up.   Patient Profile   Jennifer [name] is followed by Dr. Gollan for the history outlined below.       Past medical history significant for: Aortic atherosclerosis. Cardiac murmur. Echo 03/28/2022: EF 60 to 65%.  No RWMA.  Grade I DD.  Normal RV size/function.  Aortic valve sclerosis without stenosis. Palpitations. Hypertension. Hyperlipidemia. Lipid panel 07/27/2024: LDL 106, HDL 56, TG 196, total 202.  In summary, patient was initially evaluated by Dr. Anner on 03/14/2022 for palpitations-heart murmur.  Patient reported an episode of heart racing described as pounding and irregular that did not improve with rest.  She took a CBD pill and heart rate gradually normalized.  This all occurred in the setting of an increased amount of stress.  She reported associated chest discomfort that resolved as soon as palpitations went away.  Outpatient heart monitoring was not ordered secondary to isolated episode of palpitations without recurrence.  Echo demonstrated aortic valve sclerosis without stenosis as detailed above.  Patient was last seen in the office by me on 03/18/2024 for routine follow-up.  She reported an occasional twinge of chest pain with anxiety that had been occurring since she was a teenager.  She reported DOE that she attributed to body habitus.  Her activity was limited by back pain.  She was found to be hypertensive with BP 156/74 on intake and 150/80 on recheck.  She was not checking BP at home.  She was provided with an Rx for new BP cuff.  Amlodipine  was increased to 10 mg daily with instructions to contact the office if she developed lower extremity edema.     History of Present Illness    Today, patient denies lower  extremity edema or PND. No chest pain, pressure, or tightness. No palpitations. She reports chronic dyspnea that she attributes to body habitus and deconditioning. She sleeps on 4 pillows secondary to size of breasts. She is very frustrated today, as she cannot get a GLP1 approved. She has bilateral knee pain and back pain for which she needs surgeries but they cannot be performed until she loses weight. She reports snoring and daytime somnolence. She does not know if she stops breathing at night, as her husband sleeps with a CPAP so he does not know. She does wake often throughout the night and is not sure if this is related to restless legs. Her activity is limited by back pain and knee pain. She used to belong to a gym and loved to walk for exercise. She has a seated elliptical.      ROS: All other systems reviewed and are otherwise negative except as noted in History of Present Illness.  EKGs/Labs Reviewed    EKG Interpretation Date/Time:  Thursday September 16 2024 11:16:59 EDT Ventricular Rate:  66 PR Interval:  134 QRS Duration:  82 QT Interval:  406 QTC Calculation: 425 R Axis:   -5  Text Interpretation: Normal sinus rhythm Low voltage QRS Cannot rule out Anteroseptal infarct (cited on or before 21-Oct-2011) When compared with ECG of 18-Mar-2024 13:45, No significant change was found Confirmed by Loistine Sober [id number]) on 09/16/2024 11:21:26 AM   07/27/2024: ALT 28; AST 25; BUN 11; Creatinine, Ser 0.76; Potassium 3.3; Sodium 138  07/27/2024: Hemoglobin 14.0; WBC 5.8   07/27/2024: TSH 1.16    Risk Assessment/Calculations      HYPERTENSION CONTROL Vitals:   09/16/24 1111 09/16/24 1118 09/16/24 1240  BP: (!) 158/82 (!) 152/84 (!) 142/68    The patient's blood pressure is elevated above target today.  In order to address the patient's elevated BP: Blood pressure will be monitored at home to determine if medication changes need to be made.      STOP-Bang Score:  6        Physical Exam    VS:  BP (!) 142/68 (BP Location: Left Arm, Patient Position: Sitting, Cuff Size: Large)   Pulse 66   Ht 4' 11 (1.499 m)   Wt 225 lb (102.1 kg)   SpO2 98%   BMI 45.44 kg/m  , BMI Body mass index is 45.44 kg/m.  GEN: Well nourished, well developed, in no acute distress. Neck: No JVD or carotid bruits. Cardiac:  RRR. 1/6 systolic murmur. No rubs or gallops.   Respiratory:  Respirations regular and unlabored. Clear to auscultation without rales, wheezing or rhonchi. GI: Soft, nontender, nondistended. Extremities: Radials/DP/PT 2+ and equal bilaterally. No clubbing or cyanosis. No edema   Skin: Warm and dry, no rash. Neuro: Strength intact.  Assessment & Plan   Cardiac murmur Echo May 2023 showed normal LV/RV function, Grade I DD, aortic valve sclerosis without stenosis.  Patient denies lower extremity edema. She has chronic shortness of breath and DOE she attributes to body habitus and deconditioning. She has to sleep on 4 pillows secondary to the size of her breasts. This is not new for her. 1/6 systolic murmur on exam.  - Increase physical activity as tolerated.  - Repeat echo as clinically indicated.   Palpitations Patient denies recent palpitations. RRR on exam today. EKG shows NSR 66 bpm.  - Continue atenolol .   Hypertension BP today 152/84 on intake and 142/68 on my recheck. Home BP typically 130s systolic.  - Continue atenolol , Maxide, amlodipine .   Hyperlipidemia LDL 106 September 2025, not at goal.  Patient's PCP recently sent in rosuvastatin and she is willing to try it. She has not gotten it from the pharmacy yet.  - Start rosuvastatin per PCP.  - Continue to follow with PCP.   Sleep disordered breathing Patient reports she snores and has daytime somnolence. Stop Bang score 6.  - Refer to pulmonology.   Disposition: Refer to pulmonology. Return in 6 months or sooner as needed.          Signed, Barnie HERO. Keeana Pieratt, DNP, NP-C

## 2024-09-16 ENCOUNTER — Ambulatory Visit: Attending: Student | Admitting: Student

## 2024-09-16 ENCOUNTER — Encounter: Payer: Self-pay | Admitting: Student

## 2024-09-16 VITALS — BP 142/68 | HR 66 | Ht 59.0 in | Wt 225.0 lb

## 2024-09-16 DIAGNOSIS — R011 Cardiac murmur, unspecified: Secondary | ICD-10-CM | POA: Insufficient documentation

## 2024-09-16 DIAGNOSIS — G4730 Sleep apnea, unspecified: Secondary | ICD-10-CM | POA: Insufficient documentation

## 2024-09-16 DIAGNOSIS — R002 Palpitations: Secondary | ICD-10-CM | POA: Insufficient documentation

## 2024-09-16 DIAGNOSIS — E7800 Pure hypercholesterolemia, unspecified: Secondary | ICD-10-CM | POA: Diagnosis not present

## 2024-09-16 DIAGNOSIS — I10 Essential (primary) hypertension: Secondary | ICD-10-CM | POA: Insufficient documentation

## 2024-09-16 NOTE — Patient Instructions (Signed)
 Medication Instructions:  Your physician recommends that you continue on your current medications as directed. Please refer to the Current Medication list given to you today.  *If you need a refill on your cardiac medications before your next appointment, please call your pharmacy*  Lab Work: No labs ordered today  If you have labs (blood work) drawn today and your tests are completely normal, you will receive your results only by: MyChart Message (if you have MyChart) OR A paper copy in the mail If you have any lab test that is abnormal or we need to change your treatment, we will call you to review the results.  Testing/Procedures: No test ordered today   Follow-Up:  Referral to Pulmonary Clinic for Sleep Study At [facility], you and your health needs are our priority.  As part of our continuing mission to provide you with exceptional heart care, our providers are all part of one team.  This team includes your primary Cardiologist (physician) and Advanced Practice Providers or APPs (Physician Assistants and Nurse Practitioners) who all work together to provide you with the care you need, when you need it.  Your next appointment:   6 month(s)  Provider:   You may see Timothy Gollan, MD or one of the following Advanced Practice Providers on your designated Care Team:   Lonni Meager, NP Lesley Maffucci, PA-C Bernardino Bring, PA-C Cadence [place], PA-C Tylene Lunch, NP Barnie Hila, NP    We recommend signing up for the patient portal called MyChart.  Sign up information is provided on this After Visit Summary.  MyChart is used to connect with patients for Virtual Visits (Telemedicine).  Patients are able to view lab/test results, encounter notes, upcoming appointments, etc.  Non-urgent messages can be sent to your provider as well.   To learn more about what you can do with MyChart, go to [email or web address].

## 2024-09-20 ENCOUNTER — Encounter: Payer: Self-pay | Admitting: Radiology

## 2024-09-20 ENCOUNTER — Ambulatory Visit: Admitting: Podiatry

## 2024-09-20 DIAGNOSIS — Z79899 Other long term (current) drug therapy: Secondary | ICD-10-CM

## 2024-09-20 MED ORDER — TERBINAFINE HCL 250 MG PO TABS: 250.0000 mg | ORAL_TABLET | Freq: Every day | ORAL | 0 refills | Status: AC

## 2024-09-20 NOTE — Progress Notes (Signed)
 She presents today to discuss her nail pathology results.  Objective: Nail pathology is positive for onychomycosis nail dystrophy.  Onychomycosis is consistent with dermatophyte saprophyte and yeast.  Assessment: Onychomycosis.  Plan: We discussed topical therapy oral laser therapy and oral therapy.  At this point she would like to try oral therapy.  We will start her with Lamisil  250 mg tablets.  She will take 1 tablet daily.  Dispense 30 tablets.  Follow-up with her in 1 month.  Blood work will be performed at that time.  We did discuss the pros and cons of use this medication possible side effects and complications associated with it.

## 2024-09-23 ENCOUNTER — Other Ambulatory Visit: Payer: Self-pay | Admitting: Family Medicine

## 2024-09-23 DIAGNOSIS — I10 Essential (primary) hypertension: Secondary | ICD-10-CM

## 2024-09-30 ENCOUNTER — Encounter: Payer: Self-pay | Admitting: Sleep Medicine

## 2024-09-30 ENCOUNTER — Ambulatory Visit: Admitting: Sleep Medicine

## 2024-09-30 VITALS — BP 124/84 | HR 67 | Temp 98.0°F | Ht 59.0 in | Wt 225.6 lb

## 2024-09-30 DIAGNOSIS — R0683 Snoring: Secondary | ICD-10-CM

## 2024-09-30 DIAGNOSIS — I10 Essential (primary) hypertension: Secondary | ICD-10-CM | POA: Diagnosis not present

## 2024-09-30 DIAGNOSIS — G4733 Obstructive sleep apnea (adult) (pediatric): Secondary | ICD-10-CM
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
## 2024-09-30 NOTE — Patient Instructions (Signed)
 SABRA

## 2024-10-07 DIAGNOSIS — Z79899 Other long term (current) drug therapy: Secondary | ICD-10-CM | POA: Diagnosis not present

## 2024-10-07 DIAGNOSIS — I10 Essential (primary) hypertension: Secondary | ICD-10-CM | POA: Diagnosis not present

## 2024-10-17 ENCOUNTER — Encounter

## 2024-10-17 DIAGNOSIS — G4733 Obstructive sleep apnea (adult) (pediatric): Secondary | ICD-10-CM | POA: Diagnosis not present

## 2024-10-18 ENCOUNTER — Other Ambulatory Visit: Payer: Self-pay | Admitting: Family Medicine

## 2024-10-18 ENCOUNTER — Ambulatory Visit: Admitting: Podiatry

## 2024-10-18 DIAGNOSIS — I10 Essential (primary) hypertension: Secondary | ICD-10-CM

## 2024-10-18 NOTE — Telephone Encounter (Signed)
 Name of Medication: Atenolol  50mg  Name of Pharmacy: [facility] Last Fill or Written Date and Quantity: 07/20/24 Last Office Visit and Type: 07/27/24 Next Office Visit and Type: 07/21/25 Last Controlled Substance Agreement Date:  Last UDS:

## 2024-10-22 ENCOUNTER — Telehealth: Payer: Self-pay

## 2024-10-22 NOTE — Telephone Encounter (Signed)
 I spoke with the patient. She said completed the HST last week.   I told her we would have to wait on the results from the HST to submit to insurance to get an approval for the Zepbound.   She is aware that we will call her back with the results of the HST.  Dr. Jess please advise.

## 2024-10-22 NOTE — Telephone Encounter (Signed)
 Copied from CRM #[id number]. Topic: Clinical - Prescription Issue >> Oct 22, 2024 12:26 PM Jennifer [name] wrote: Reason for CRM: pt stated that her insurance rejected the Zepbound and she would like to know what are her options at this time.

## 2024-10-25 DIAGNOSIS — R0683 Snoring: Secondary | ICD-10-CM | POA: Diagnosis not present

## 2024-10-26 ENCOUNTER — Encounter: Payer: Self-pay | Admitting: Sleep Medicine

## 2024-10-26 ENCOUNTER — Ambulatory Visit: Admitting: Sleep Medicine

## 2024-10-26 VITALS — BP 118/82 | HR 67 | Temp 98.7°F | Ht 59.0 in | Wt 225.4 lb

## 2024-10-26 DIAGNOSIS — G4733 Obstructive sleep apnea (adult) (pediatric): Secondary | ICD-10-CM | POA: Diagnosis not present

## 2024-10-26 DIAGNOSIS — I10 Essential (primary) hypertension: Secondary | ICD-10-CM

## 2024-10-26 MED ORDER — ZEPBOUND 2.5 MG/0.5ML ~~LOC~~ SOAJ: 2.5000 mg | SUBCUTANEOUS | 1 refills | Status: DC

## 2024-10-26 NOTE — Telephone Encounter (Signed)
 Prior auth team can you try and get the Zepbound  approved by sending in her sleep study results from 10/17/2024. They have been scanned into her chart. Thank you!

## 2024-10-26 NOTE — Telephone Encounter (Signed)
 Sleep Study was scanned in today. The patient has scheduled an appt for today to review the sleep study results in person.
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
## 2024-10-26 NOTE — Patient Instructions (Signed)

## 2024-10-27 ENCOUNTER — Telehealth: Payer: Self-pay

## 2024-10-27 ENCOUNTER — Other Ambulatory Visit (HOSPITAL_COMMUNITY): Payer: Self-pay

## 2024-10-27 NOTE — Telephone Encounter (Signed)
*  Pulm  Pharmacy Patient Advocate Encounter   Received notification from Pt Calls Messages that prior authorization for Zepbound  2.5mg  pen is required/requested.   Insurance verification completed.   The patient is insured through [employer].   Per test claim: Per test claim, medication is not covered due to plan/benefit exclusion, PA not submitted at this time

## 2024-11-02 DIAGNOSIS — M5116 Intervertebral disc disorders with radiculopathy, lumbar region: Secondary | ICD-10-CM | POA: Diagnosis not present

## 2024-11-04 NOTE — Telephone Encounter (Signed)
 I have notified the patient. She will reach out to her PCP regarding other weight loss options.   Nothing further needed.

## 2024-11-08 MED ORDER — ZEPBOUND 2.5 MG/0.5ML ~~LOC~~ SOAJ
2.5000 mg | SUBCUTANEOUS | 1 refills | Status: DC
  Filled 2024-11-17: qty 2, 28d supply, fill #0

## 2024-11-08 NOTE — Telephone Encounter (Signed)
 Script has been printed and placed at the front desk for the patient to pick up.  Nothing further needed.

## 2024-11-08 NOTE — Telephone Encounter (Signed)
 Copied from CRM #[id number]. Topic: Clinical - Prescription Issue >> Nov 08, 2024  8:57 AM Ismael A wrote: Reason for CRM: pt states her zepbound  rx was denied by insurance, pt is now requesting a written rx from Dr. Jess for zepbound  to take to a few different pharmacies as other options >> Nov 08, 2024  1:51 PM Rozanna G wrote: Pt is returning call about the Zepbound  prescription, stated she will keep trying and get some help with it. Stated can not afford it but will see what she can do. Stated she will come by the clinic and pick up the prescription tomorrow in the morning.

## 2024-11-08 NOTE — Telephone Encounter (Signed)
 Per Dr. Jess- okay to give patient a printed prescription but all the pharmacies should be about the same price.  LMTCB. E2C2 please advise when patient calls back.

## 2024-11-08 NOTE — Telephone Encounter (Signed)
 Copied from CRM #[id number]. Topic: Clinical - Prescription Issue >> Nov 08, 2024  8:57 AM Ismael A wrote: Reason for CRM: pt states her zepbound  rx was denied by insurance, pt is now requesting a written rx from Dr. Jess for zepbound  to take to a few different pharmacies as other options   Routing to [place].

## 2024-11-09 ENCOUNTER — Other Ambulatory Visit (HOSPITAL_BASED_OUTPATIENT_CLINIC_OR_DEPARTMENT_OTHER): Payer: Self-pay

## 2024-11-17 ENCOUNTER — Other Ambulatory Visit (HOSPITAL_COMMUNITY): Payer: Self-pay

## 2024-11-19 ENCOUNTER — Telehealth: Payer: Self-pay

## 2024-11-19 NOTE — Telephone Encounter (Signed)
 Copied from CRM #[id number]. Topic: Clinical - Medical Advice >> Nov 19, 2024  9:54 AM Jennifer [name] wrote: Reason for CRM: Patient states she is unable to use her CPAP machine due to her having claustrophobia - states its making her scared and its not working out well.  Patient is requesting an order for it to be returned.   Callback number: [phone]

## 2024-11-22 ENCOUNTER — Other Ambulatory Visit (HOSPITAL_COMMUNITY): Payer: Self-pay

## 2024-11-22 ENCOUNTER — Telehealth (HOSPITAL_COMMUNITY): Payer: Self-pay

## 2024-11-22 MED ORDER — TIRZEPATIDE-WEIGHT MANAGEMENT 2.5 MG/0.5ML ~~LOC~~ SOAJ
2.5000 mg | SUBCUTANEOUS | 1 refills | Status: DC
  Filled 2024-11-22 (×2): qty 2, 28d supply, fill #0

## 2024-11-23 ENCOUNTER — Other Ambulatory Visit (HOSPITAL_COMMUNITY): Payer: Self-pay

## 2024-11-23 MED ORDER — TIRZEPATIDE-WEIGHT MANAGEMENT 2.5 MG/0.5ML ~~LOC~~ SOLN: 2.5000 mg | SUBCUTANEOUS | 0 refills | Status: DC

## 2024-11-23 NOTE — Telephone Encounter (Signed)
 Patient came into the office today to ask us  to send her Zepbound  script to [facility] for her to do the self pay option.  I have sent the Zepbound  vial to [facility] and the patient is aware.  Nothing further needed.

## 2024-11-23 NOTE — Telephone Encounter (Signed)
 The patient came into the office today. She said she did see the message back with Dr. Reddy's recommendation and she is going tomorrow to get another mask to try. She will let us  know if she has any other issues.  Nothing further needed.

## 2024-11-23 NOTE — Addendum Note (Signed)
 Addended by: VICCI EVALENE DEL on: 11/23/2024 10:43 AM   Modules accepted: Orders

## 2024-12-10 ENCOUNTER — Other Ambulatory Visit: Payer: Self-pay | Admitting: Sleep Medicine

## 2024-12-10 DIAGNOSIS — G4733 Obstructive sleep apnea (adult) (pediatric): Secondary | ICD-10-CM

## 2024-12-14 ENCOUNTER — Other Ambulatory Visit: Payer: Self-pay | Admitting: Sleep Medicine

## 2024-12-14 DIAGNOSIS — G4733 Obstructive sleep apnea (adult) (pediatric): Secondary | ICD-10-CM

## 2025-01-24 ENCOUNTER — Ambulatory Visit: Admitting: Sleep Medicine

## 2025-03-18 ENCOUNTER — Ambulatory Visit: Admitting: Student

## 2025-07-21 ENCOUNTER — Ambulatory Visit

## 2025-07-22 ENCOUNTER — Ambulatory Visit
